# Patient Record
Sex: Male | Born: 1965 | Race: White | Hispanic: No | Marital: Married | Smoking: Never smoker
Health system: Southern US, Community
[De-identification: ages and names within clinical notes are randomized; demographics above are authoritative.]

## PROBLEM LIST (undated history)

## (undated) DIAGNOSIS — R0683 Snoring: Secondary | ICD-10-CM

## (undated) DIAGNOSIS — G4719 Other hypersomnia: Secondary | ICD-10-CM

## (undated) DIAGNOSIS — G4733 Obstructive sleep apnea (adult) (pediatric): Secondary | ICD-10-CM

## (undated) DIAGNOSIS — E785 Hyperlipidemia, unspecified: Secondary | ICD-10-CM

## (undated) DIAGNOSIS — I10 Essential (primary) hypertension: Secondary | ICD-10-CM

## (undated) DIAGNOSIS — I2510 Atherosclerotic heart disease of native coronary artery without angina pectoris: Secondary | ICD-10-CM

## (undated) DIAGNOSIS — K4091 Unilateral inguinal hernia, without obstruction or gangrene, recurrent: Secondary | ICD-10-CM

## (undated) DIAGNOSIS — M1990 Unspecified osteoarthritis, unspecified site: Secondary | ICD-10-CM

## (undated) DIAGNOSIS — R079 Chest pain, unspecified: Secondary | ICD-10-CM

## (undated) HISTORY — DX: Other hypersomnia: G47.19

## (undated) HISTORY — DX: Obstructive sleep apnea (adult) (pediatric): G47.33

## (undated) HISTORY — DX: Chest pain, unspecified: R07.9

## (undated) HISTORY — DX: Atherosclerotic heart disease of native coronary artery without angina pectoris: I25.10

## (undated) HISTORY — DX: Unspecified osteoarthritis, unspecified site: M19.90

## (undated) HISTORY — PX: VENTRAL HERNIA REPAIR: SHX424

## (undated) HISTORY — DX: Essential (primary) hypertension: I10

## (undated) HISTORY — DX: Hyperlipidemia, unspecified: E78.5

## (undated) HISTORY — DX: Snoring: R06.83

## (undated) HISTORY — DX: Morbid (severe) obesity due to excess calories: E66.01

## (undated) HISTORY — DX: Unilateral inguinal hernia, without obstruction or gangrene, recurrent: K40.91

## (undated) HISTORY — PX: CARDIAC CATHETERIZATION: SHX172

---

## 1998-03-23 ENCOUNTER — Emergency Department (HOSPITAL_COMMUNITY): Admission: EM | Admit: 1998-03-23 | Discharge: 1998-03-23 | Payer: Self-pay | Admitting: Emergency Medicine

## 1998-04-05 ENCOUNTER — Observation Stay (HOSPITAL_COMMUNITY): Admission: RE | Admit: 1998-04-05 | Discharge: 1998-04-06 | Payer: Self-pay | Admitting: Surgery

## 1999-04-07 ENCOUNTER — Observation Stay (HOSPITAL_COMMUNITY): Admission: RE | Admit: 1999-04-07 | Discharge: 1999-04-08 | Payer: Self-pay | Admitting: Surgery

## 2000-07-27 ENCOUNTER — Ambulatory Visit (HOSPITAL_BASED_OUTPATIENT_CLINIC_OR_DEPARTMENT_OTHER): Admission: RE | Admit: 2000-07-27 | Discharge: 2000-07-28 | Payer: Self-pay | Admitting: Surgery

## 2006-09-02 ENCOUNTER — Emergency Department (HOSPITAL_COMMUNITY): Admission: EM | Admit: 2006-09-02 | Discharge: 2006-09-02 | Payer: Self-pay | Admitting: Emergency Medicine

## 2007-02-04 ENCOUNTER — Ambulatory Visit (HOSPITAL_COMMUNITY): Admission: RE | Admit: 2007-02-04 | Discharge: 2007-02-04 | Payer: Self-pay | Admitting: Family Medicine

## 2007-02-04 IMAGING — US US ABDOMEN COMPLETE
1 series · 13 of 25 positions shown · non-contrast
Comparison: None.

CLINICAL DATA: Left upper quadrant pain.  
 ABDOMEN ULTRASOUND:
TECHNIQUE: Complete abdominal ultrasound examination was performed including evaluation of the liver, gallbladder, bile ducts, pancreas, kidneys, spleen, IVC, and abdominal aorta.

[Series 1: unknown · 0.37mm/px · 13 of 52 slices shown]
[im 1/52]
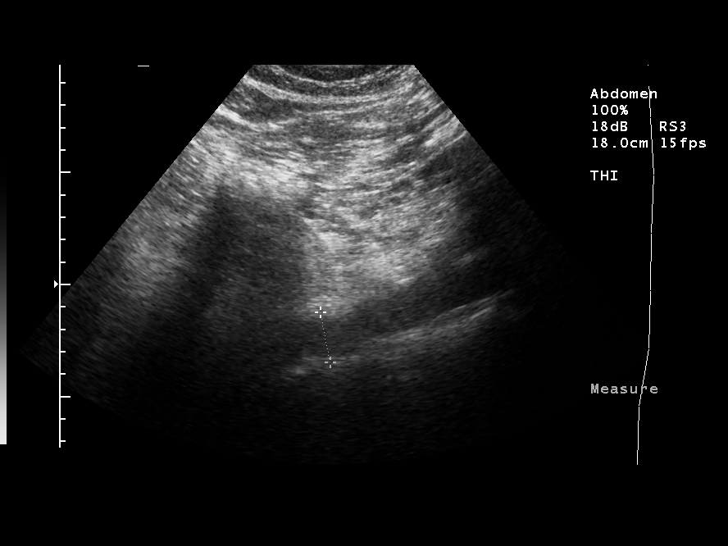
[im 5/52]
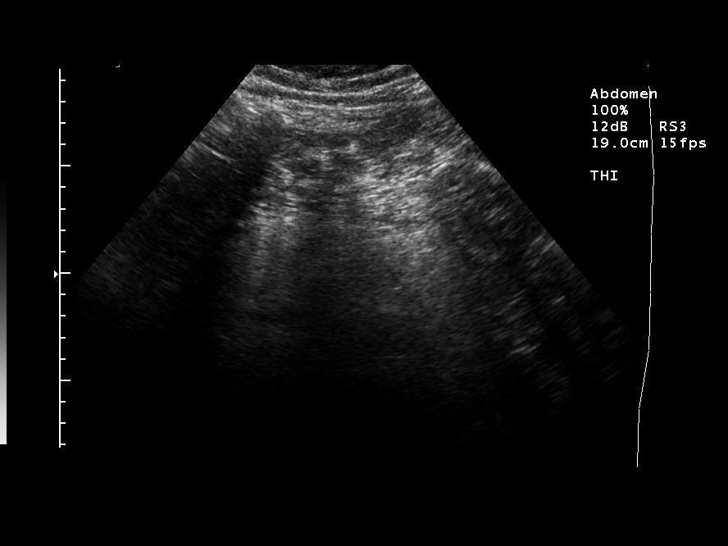
[im 9/52]
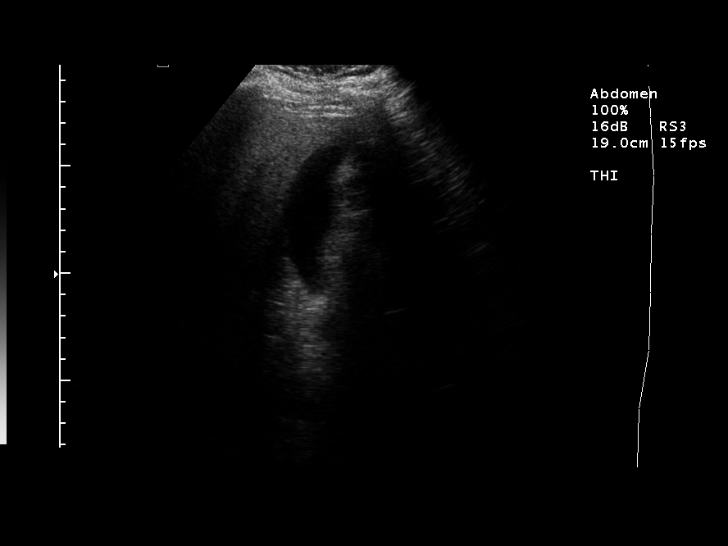
[im 13/52]
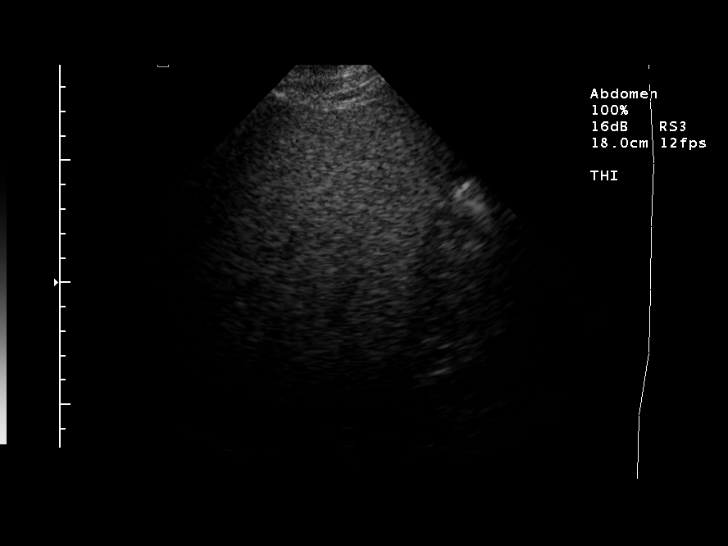
[im 18/52]
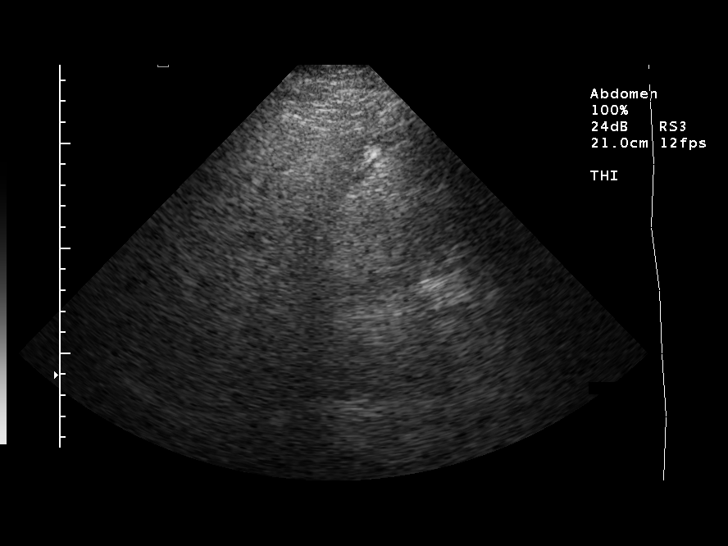
[im 22/52]
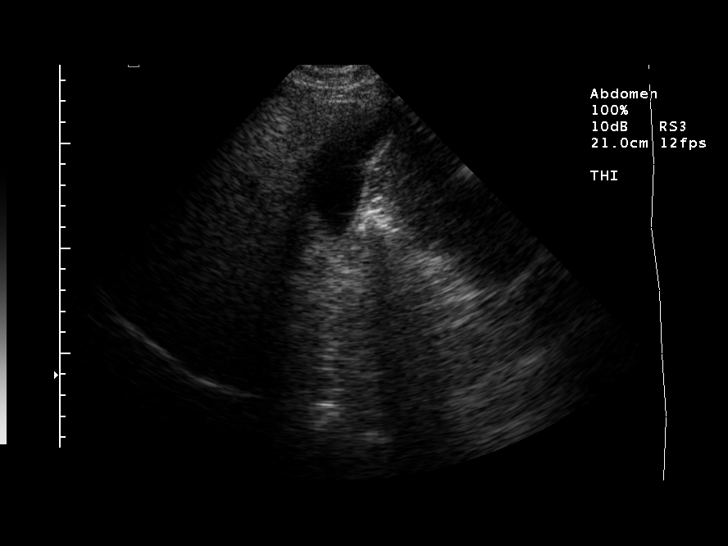
[im 26/52]
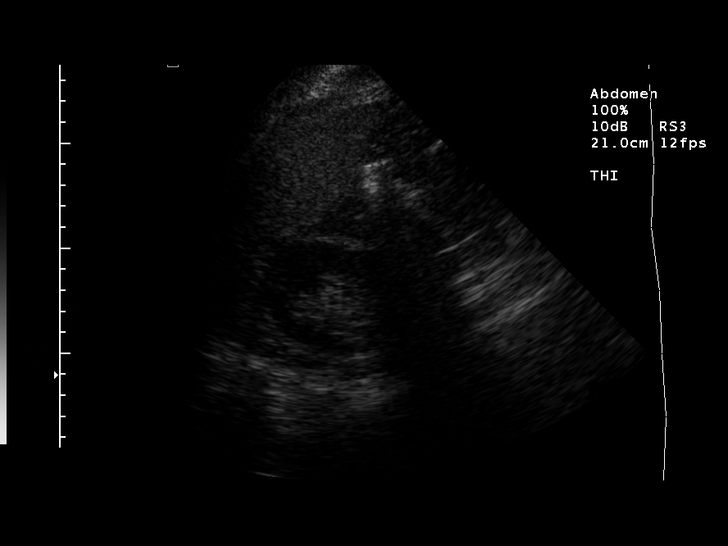
[im 30/52]
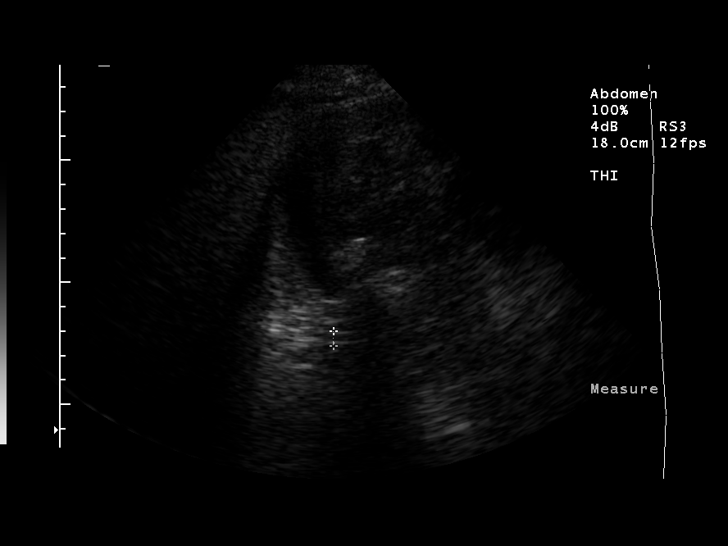
[im 35/52]
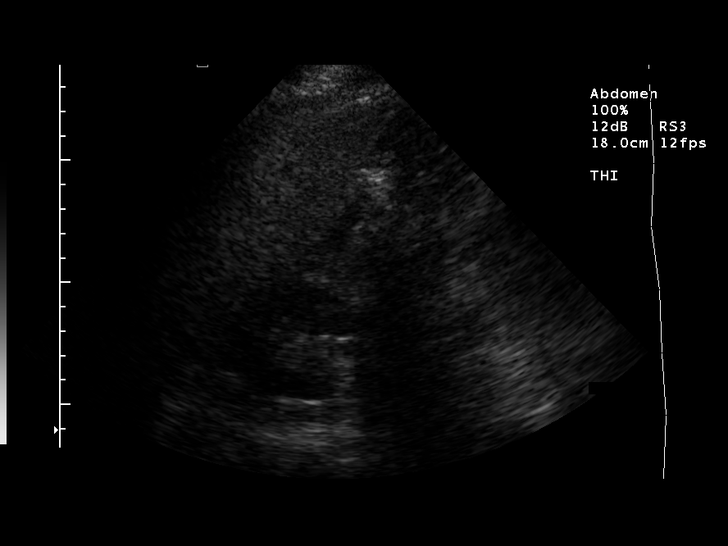
[im 39/52]
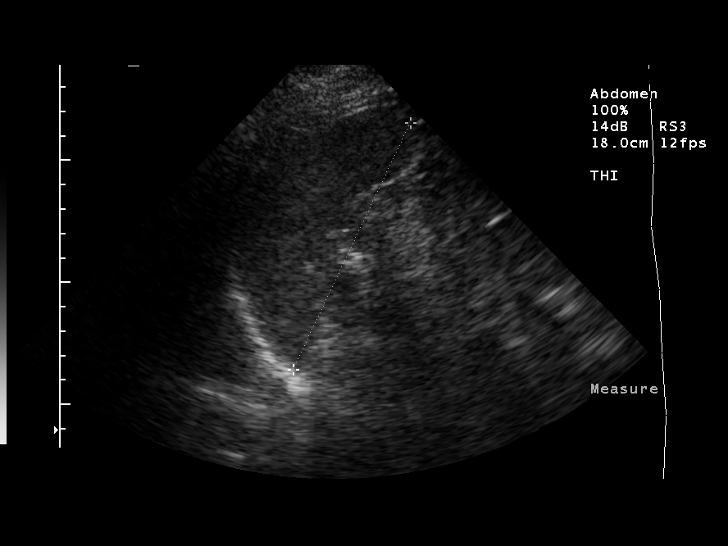
[im 43/52]
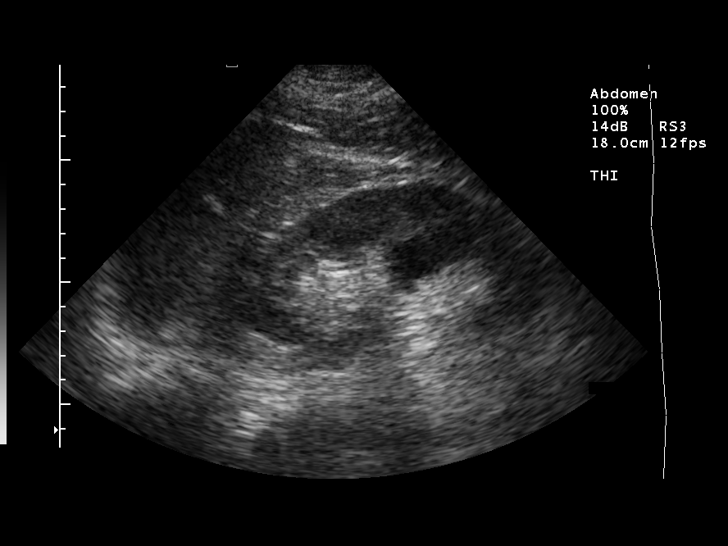
[im 47/52]
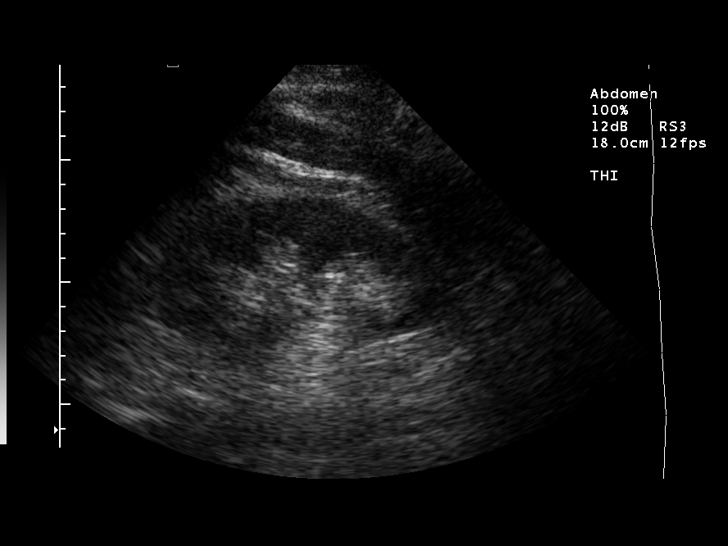
[im 52/52]
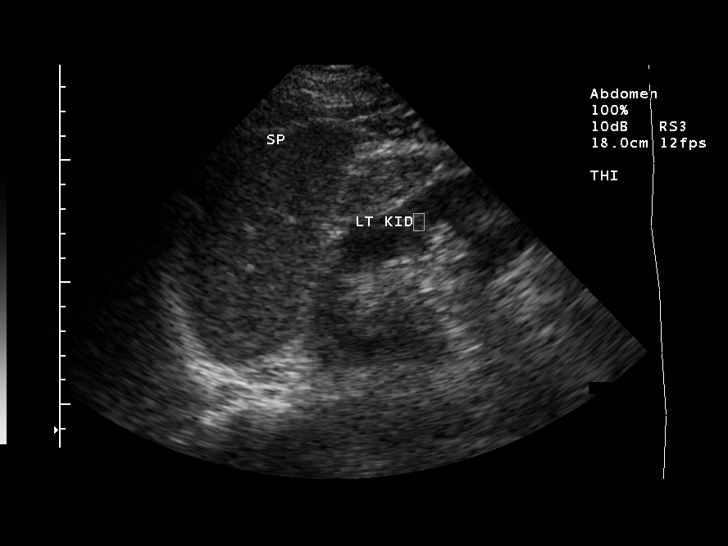

[13 of 25 positions shown; findings below may reference images not displayed]

FINDINGS: Gallbladder size and contour normal.  No stones or wall thickening.  Common bile duct upper limits of normal, measured at 6 mm.  No intrahepatic duct dilatation.  There is increased density of the liver however.  This is most commonly seen with fatty infiltration, but also can be seen in diffuse hepatocellular disease.  
 Excessive bowel gas in the epigastrium obscures most of the pancreas and the IVC.  There is no enlargement of the spleen, nor are there filling defects noted. There is no obvious mass in the left upper quadrant.  
 Kidney size normal.  There is a simple cyst noted centrally in the left kidney measuring about 2.4 cm.  Aorta nondilated.
IMPRESSION: 1.  No definite acute or significant findings ? specifically, no obvious etiology for left upper quadrant pain.  Consider CT if symptoms persist.
 2.  Pancreas inadequately evaluated.  
 3.  Echodense liver. 
 4.  Simple left renal cyst.

## 2008-10-30 HISTORY — PX: INGUINAL HERNIA REPAIR: SUR1180

## 2009-01-20 ENCOUNTER — Encounter: Admission: RE | Admit: 2009-01-20 | Discharge: 2009-01-20 | Payer: Self-pay | Admitting: Family Medicine

## 2009-01-20 IMAGING — CT CT PARANASAL SINUSES LIMITED
1 series · 13 of 15 positions shown, 17 images · non-contrast
Comparison: None.

CLINICAL DATA: Sinusitis.

CT PARANASAL SINUS LIMITED WITHOUT CONTRAST
TECHNIQUE: Multidetector CT images of the paranasal sinuses were
obtained in a single plane without contrast.

[Series 2: sinus · axial · 0.49mm/px · z∈[-316,-228]mm · 13 of 15 slices shown, 17 images]
[im 2/15  brain]
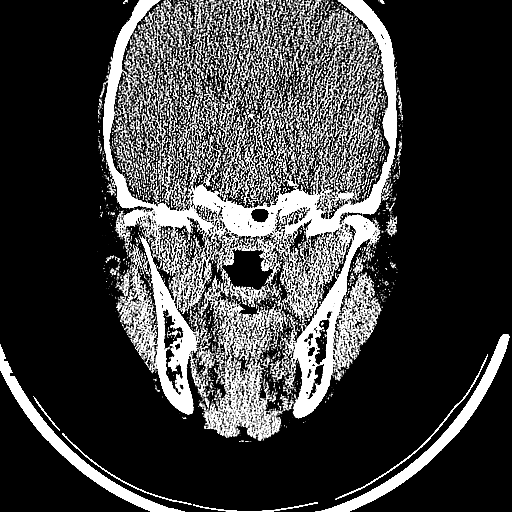
[im 2/15  bone]
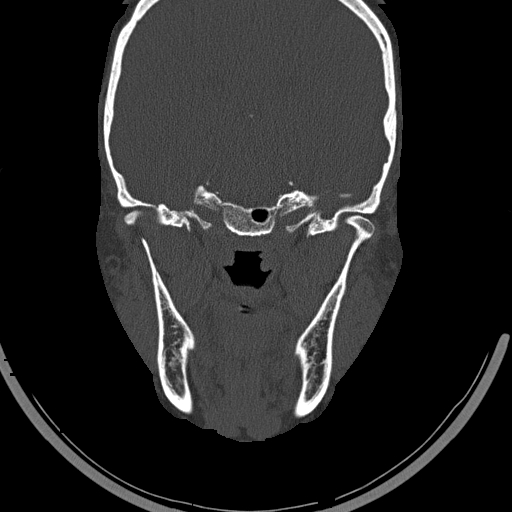
[im 3/15  bone]
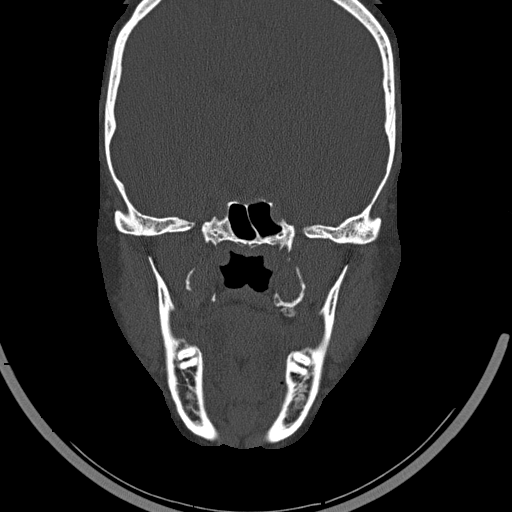
[im 4/15  bone]
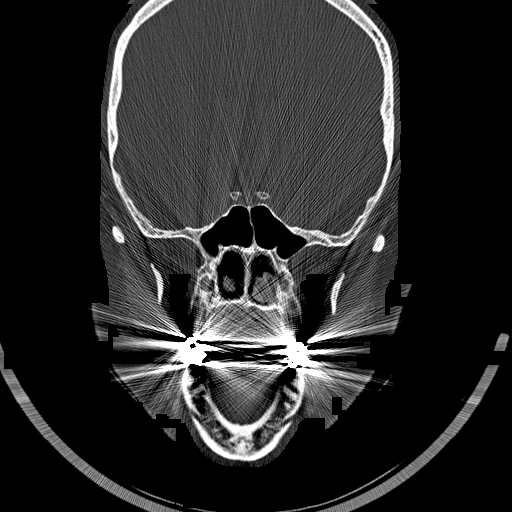
[im 5/15  bone]
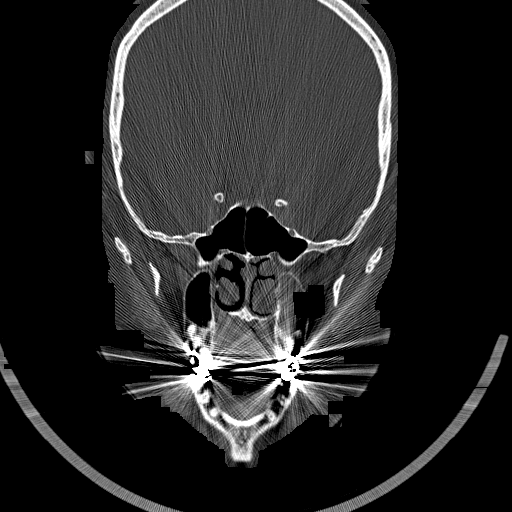
[im 6/15  brain]
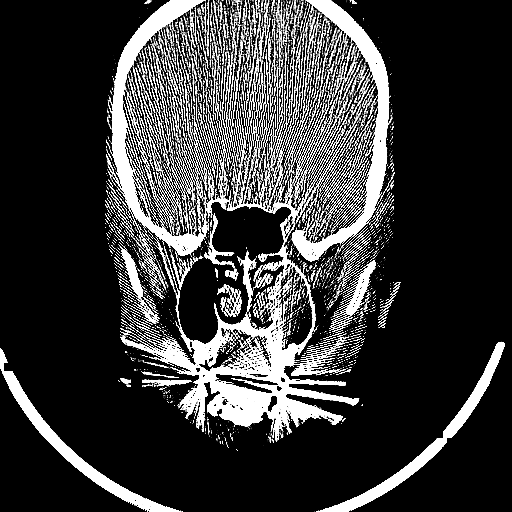
[im 6/15  bone]
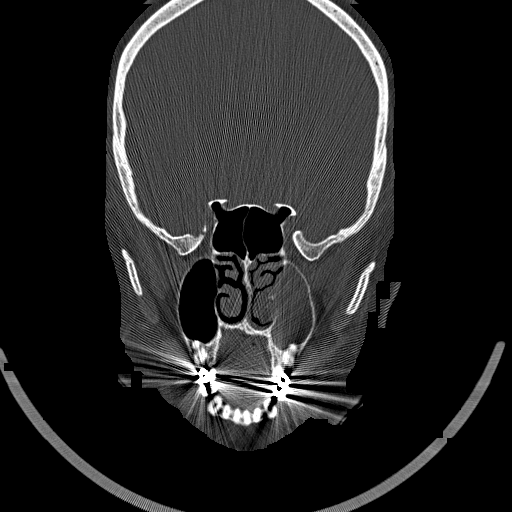
[im 7/15  bone]
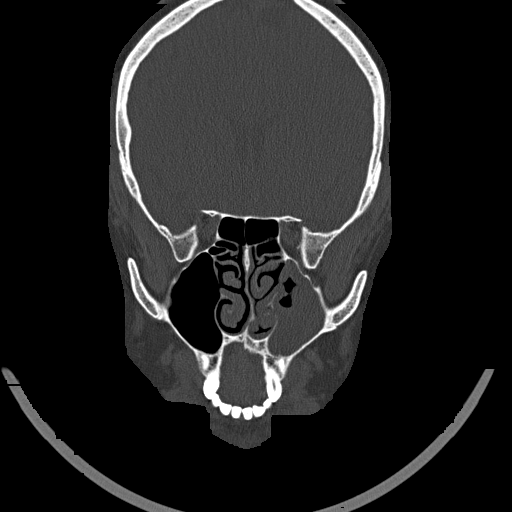
[im 8/15  bone]
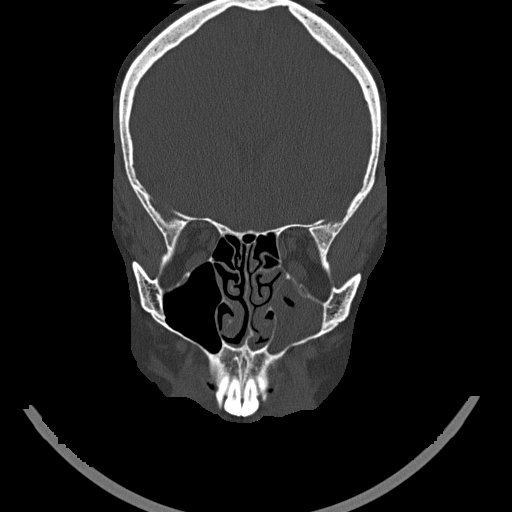
[im 9/15  bone]
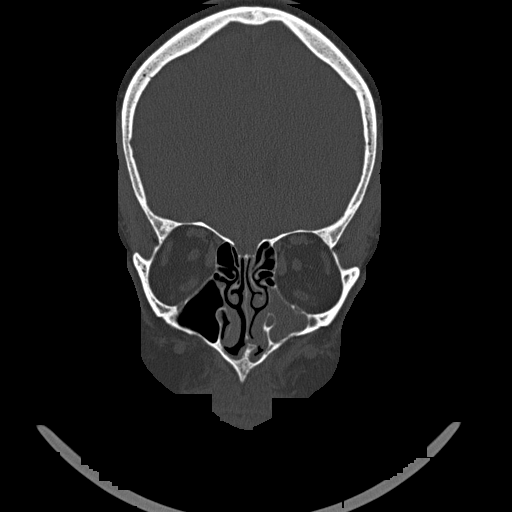
[im 10/15  brain]
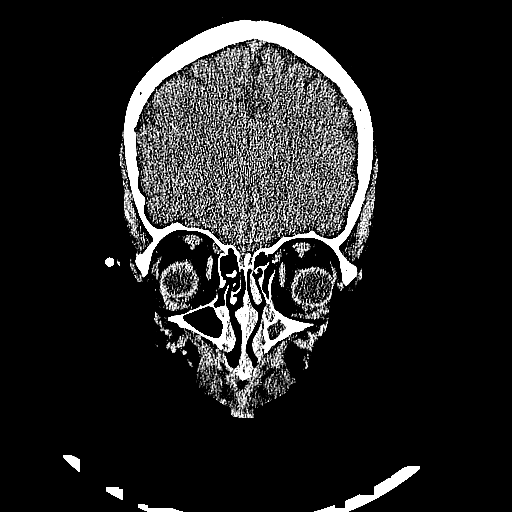
[im 10/15  bone]
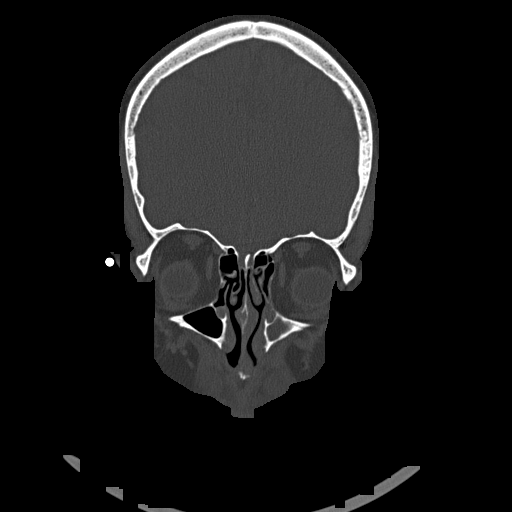
[im 11/15  bone]
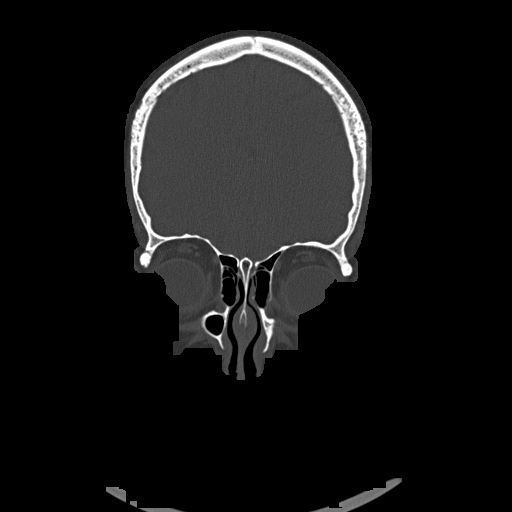
[im 12/15  bone]
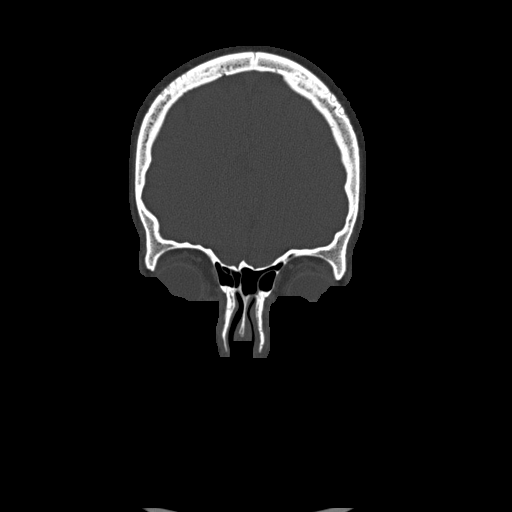
[im 13/15  bone]
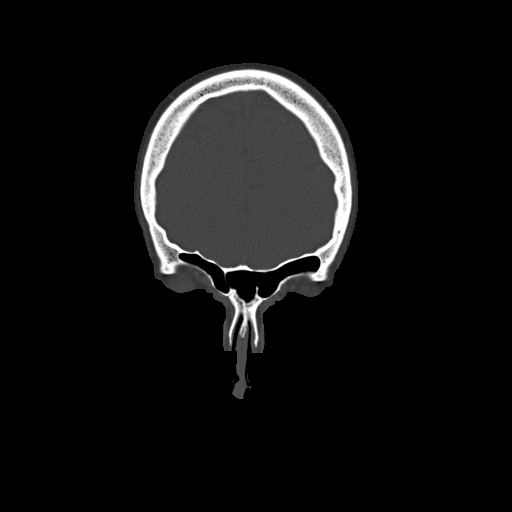
[im 14/15  brain]
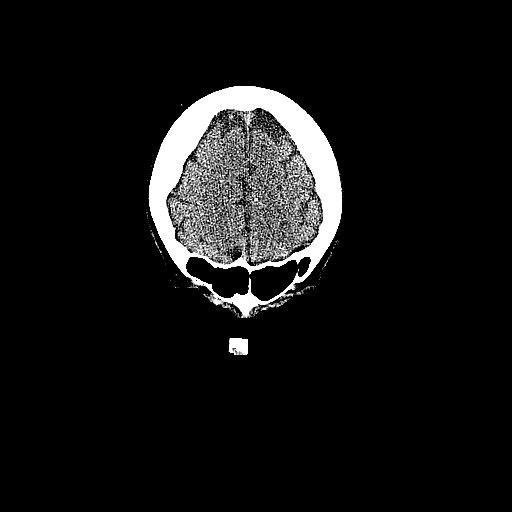
[im 14/15  bone]
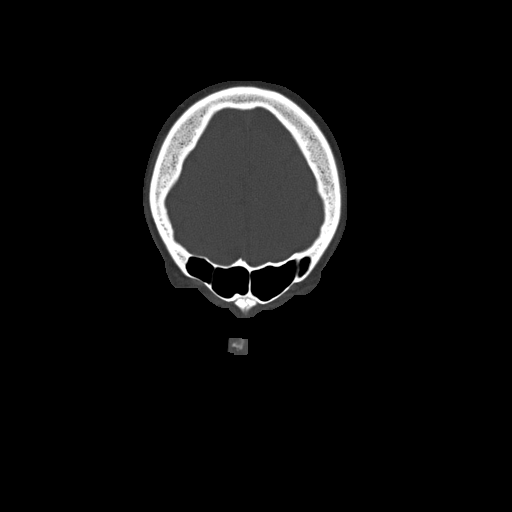

[13 of 15 positions shown; findings below may reference images not displayed]

FINDINGS: The left maxillary sinus is nearly completely opacified.
The right maxillary sinus is clear.  The frontal ethmoid and
sphenoid sinuses are clear.  There is no air-fluid level.  The
nasal septum is deviated to the left.  There is no bony thickening
or bone destruction.
IMPRESSION: Opacification of the left maxillary sinus due to chronic sinusitis.
The remaining sinuses are clear.

## 2009-03-19 ENCOUNTER — Ambulatory Visit (HOSPITAL_COMMUNITY): Admission: RE | Admit: 2009-03-19 | Discharge: 2009-03-19 | Payer: Self-pay | Admitting: Specialist

## 2009-03-19 IMAGING — CR DG ORBITS FOR FOREIGN BODY
2 series · 2 of 2 positions shown · non-contrast
Comparison: None

CLINICAL DATA: Pre MRI screening

ORBITS FOR FOREIGN BODY - 2 VIEW

[view not recorded (1 of 2)]
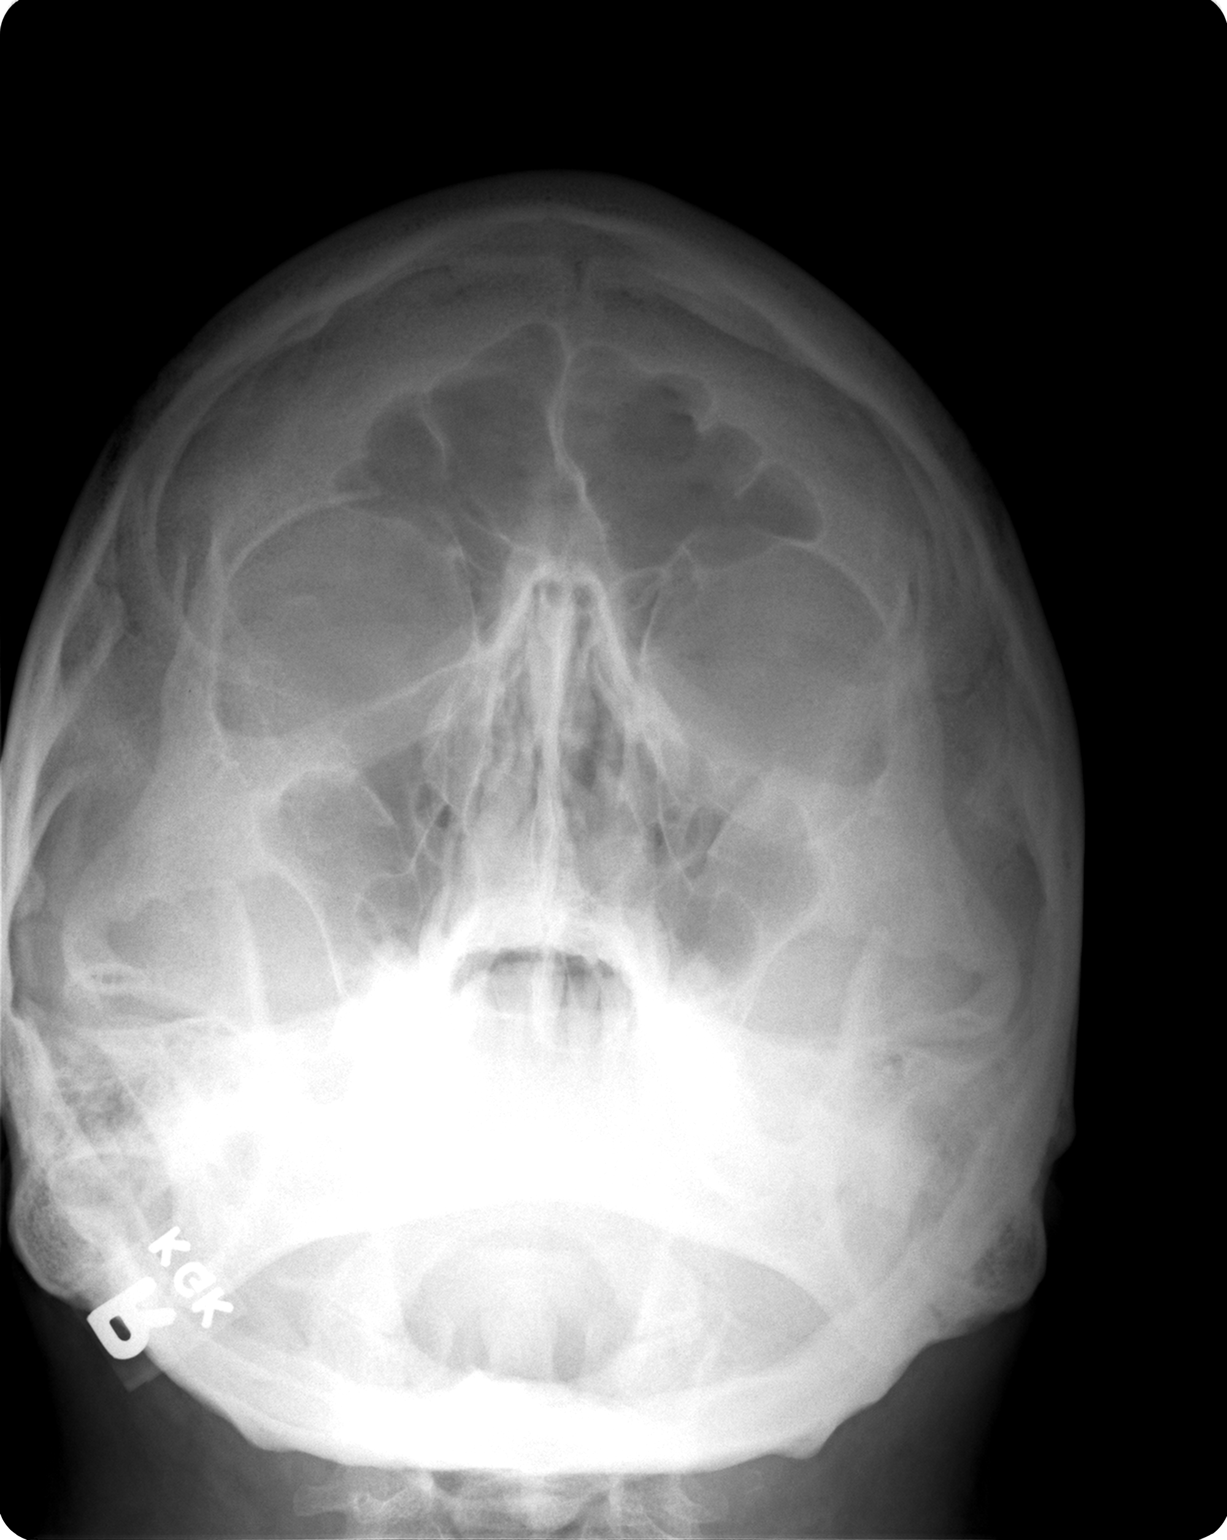

[view not recorded (2 of 2)]
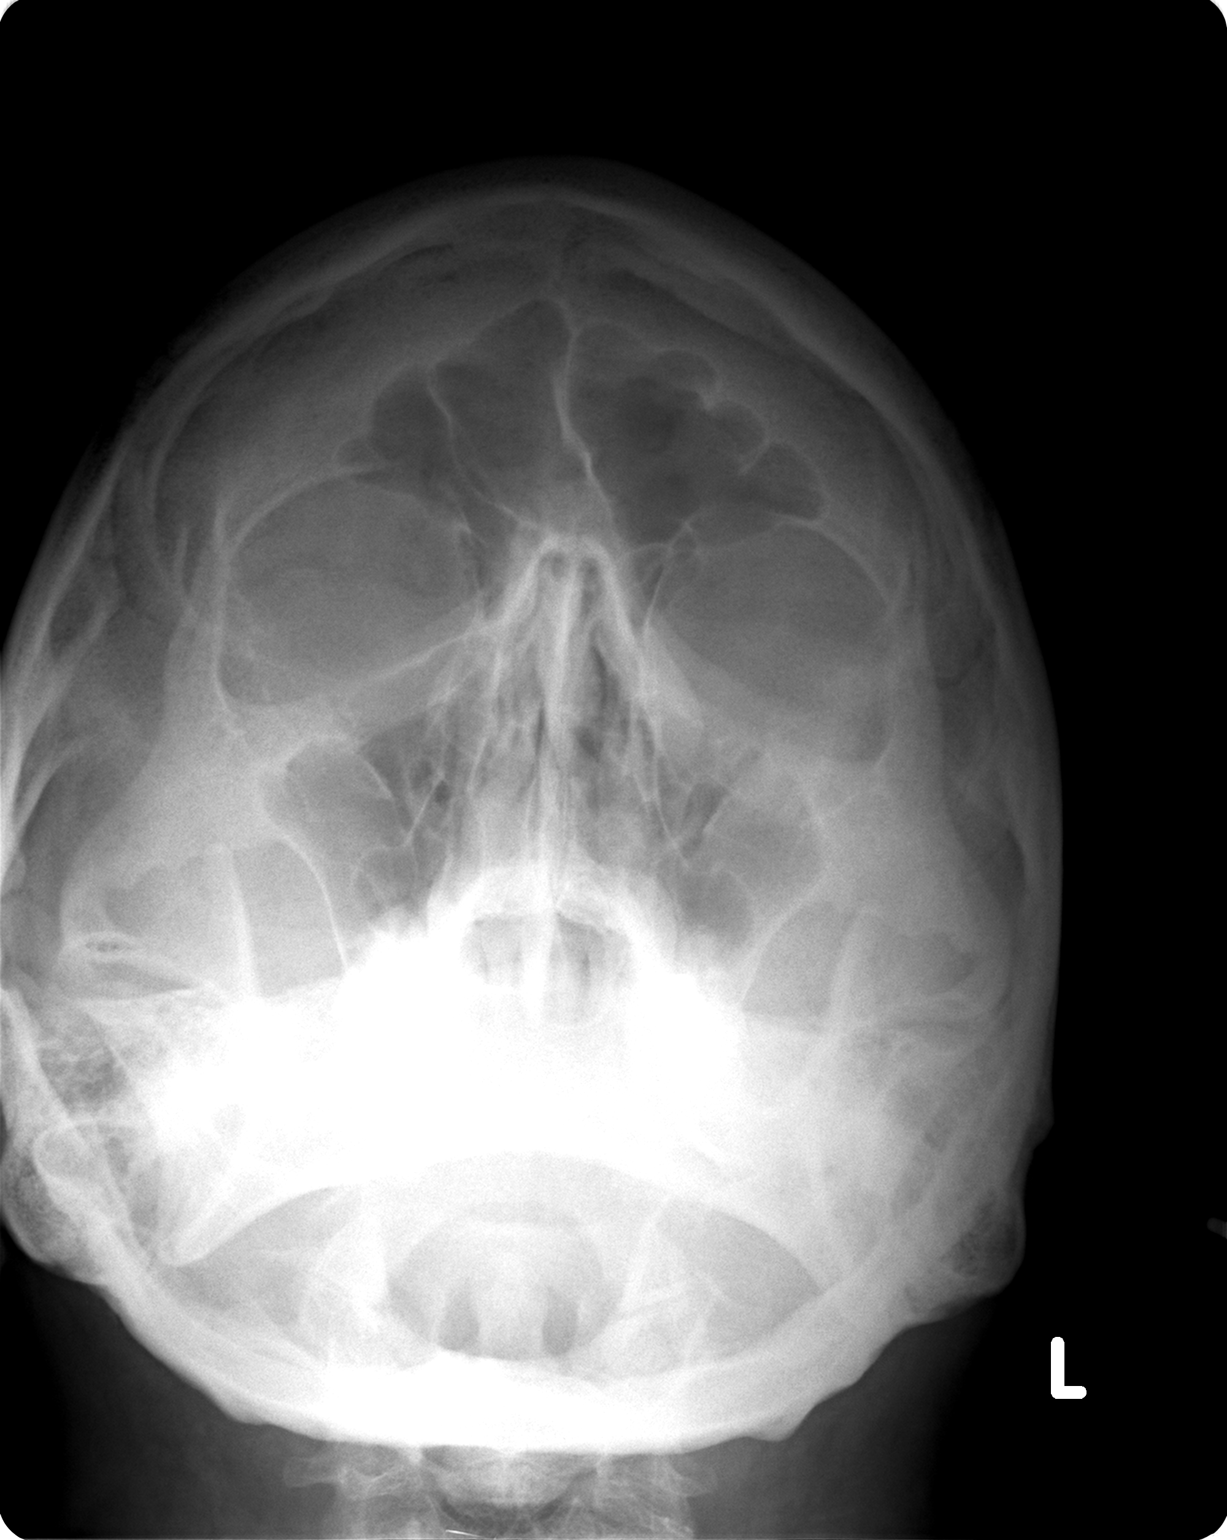

[2 of 2 positions shown; findings below may reference images not displayed]

FINDINGS: No radiopaque foreign bodies are identified within the
orbits to preclude MRI.
IMPRESSION: 1.  Normal exam.

## 2009-08-09 ENCOUNTER — Ambulatory Visit (HOSPITAL_BASED_OUTPATIENT_CLINIC_OR_DEPARTMENT_OTHER): Admission: RE | Admit: 2009-08-09 | Discharge: 2009-08-09 | Payer: Self-pay | Admitting: General Surgery

## 2010-11-20 ENCOUNTER — Encounter: Payer: Self-pay | Admitting: Family Medicine

## 2011-02-02 LAB — POCT HEMOGLOBIN-HEMACUE: Hemoglobin: 16.8 g/dL (ref 13.0–17.0)

## 2011-03-17 NOTE — Op Note (Signed)
[place]  Patient:    NEPHI, SAVAGE                  MRN: [id number] Proc. Date: 07/27/00 Adm. Date:  [id number] Attending:  Katha Cabal                           Operative Report  PREOPERATIVE DIAGNOSIS:  Recurrent ventral hernia.  POSTOPERATIVE DIAGNOSIS:  Left lateral recurrent to previous repair of ventral hernia.  SURGEON:  Thornton Park. Daphine Deutscher, M.D.  ASSISTANT:  Adolph Pollack, M.D.  ANESTHESIA:  General endotracheal anesthesia.  FINDINGS:  Small epigastric recurrent hernia containing preperitoneal fat.  DESCRIPTION OF PROCEDURE:  Mr. Margraf is taken to OR #6 at [facility] on July 27, 2000 and given general anesthesia.  Preoperatively, he received a gram of Ancef.  The abdomen was prepped widely with Betadine and draped sterilely.  I made an incision initially using just part of his previous transverse incision and eventually taking it all.  Carrying this down in the fatty tissue and found the preperitoneal hernia lateral to the previous repair with mesh.  What I found when I had freed this up and gotten around the hernia was that the fascia had torn out to the edge of the rectus sheath.  This would imply that he had diastasis and weakening in this area, because I had previously repaired this both from within and without with Marlex mesh as well as incorporating his previously placed Gore-Tex.  I then repaired this using a vest-over-pants repair using 0 Prolene to imbricate the fascia and repair this hole.  Then on top, I placed a piece of Marlex mesh which I cut to fit and threaded the previously placed sutures up through that as well as I had under direct vision and with direct feeling placed four horizontal mattress sutures in the fascia which I had anchored and tied and then threaded those up to anchor the mesh laterally, superiorly, medially, and inferiorly.  When this was tied down, it seemed to  be secure with permanent suture.  I then went back with 2-0 Vicryl and tacked these to the fascia even more to make it completely adherent anteriorly.  The wound was then irrigated with a copious amount of saline.  While I did have the hole opened, I was able to feel around and I did not have any involvement with bowel.  I could feel the Gore-Tex mesh around.  There was a little area superiorly that I could feel a tack in the edge of the mesh and it felt like it might be starting to tear, although it was a very tiny probably millimeter size defect.  At this point, I felt that this was not worth making a separate incision and potentially weakening more of that wound.  After completing the closure in the aforementioned repair, I used 4-0 Vicryl to close the subcutaneous tissue and staples on the skin.  The patient will be kept at [facility] in the recovery care center possibly to go home later today or to be kept overnight.  Mepergan Fortes prescription is given for pain. DD:  07/27/00 TD:  07/27/00 Job: [id number]

## 2012-03-04 ENCOUNTER — Encounter (HOSPITAL_COMMUNITY): Payer: Self-pay

## 2012-03-04 ENCOUNTER — Emergency Department (HOSPITAL_COMMUNITY): Payer: 59

## 2012-03-04 ENCOUNTER — Observation Stay (HOSPITAL_COMMUNITY)
Admission: EM | Admit: 2012-03-04 | Discharge: 2012-03-05 | Disposition: A | Discharge status: Home / Self Care | Payer: 59 | Attending: Emergency Medicine | Admitting: Emergency Medicine

## 2012-03-04 DIAGNOSIS — R0609 Other forms of dyspnea: Secondary | ICD-10-CM | POA: Insufficient documentation

## 2012-03-04 DIAGNOSIS — R079 Chest pain, unspecified: Principal | ICD-10-CM | POA: Insufficient documentation

## 2012-03-04 DIAGNOSIS — I10 Essential (primary) hypertension: Secondary | ICD-10-CM | POA: Insufficient documentation

## 2012-03-04 DIAGNOSIS — R0989 Other specified symptoms and signs involving the circulatory and respiratory systems: Secondary | ICD-10-CM | POA: Insufficient documentation

## 2012-03-04 DIAGNOSIS — R61 Generalized hyperhidrosis: Secondary | ICD-10-CM | POA: Insufficient documentation

## 2012-03-04 HISTORY — DX: Essential (primary) hypertension: I10

## 2012-03-04 LAB — BASIC METABOLIC PANEL
BUN: 12 mg/dL (ref 6–23)
CO2: 24 mEq/L (ref 19–32)
Calcium: 9.4 mg/dL (ref 8.4–10.5)
Chloride: 103 mEq/L (ref 96–112)
Creatinine, Ser: 0.77 mg/dL (ref 0.50–1.35)
GFR calc Af Amer: >90 mL/min (ref >90)

## 2012-03-04 LAB — DIFFERENTIAL
Basophils Absolute: 0 10*3/uL (ref 0.0–0.1)
Basophils Relative: 0 % (ref 0–1)
Eosinophils Relative: 1 % (ref 0–5)
Monocytes Absolute: 0.5 10*3/uL (ref 0.1–1.0)
Monocytes Relative: 6 % (ref 3–12)
Neutro Abs: 6 10*3/uL (ref 1.7–7.7)

## 2012-03-04 LAB — CARDIAC PANEL(CRET KIN+CKTOT+MB+TROPI)
Relative Index: 2.4 (ref 0.0–2.5)
Relative Index: 2 (ref 0.0–2.5)
Relative Index: 2.5 (ref 0.0–2.5)
Total CK: 205 U/L (ref 7–232)
Total CK: 232 U/L (ref 7–232)
Total CK: 179 U/L (ref 7–232)
Troponin I: <0.3 ng/mL (ref <0.30)

## 2012-03-04 LAB — CBC
HCT: 43.1 % (ref 39.0–52.0)
Hemoglobin: 14.7 g/dL (ref 13.0–17.0)
MCHC: 34.1 g/dL (ref 30.0–36.0)
MCV: 85.9 fL (ref 78.0–100.0)
RDW: 12.7 % (ref 11.5–15.5)

## 2012-03-04 IMAGING — CR DG CHEST 2V
2 series · 2 of 2 positions shown · non-contrast
Comparison: None.

CLINICAL DATA: Chest pain

CHEST - 2 VIEW

[w chest pa]
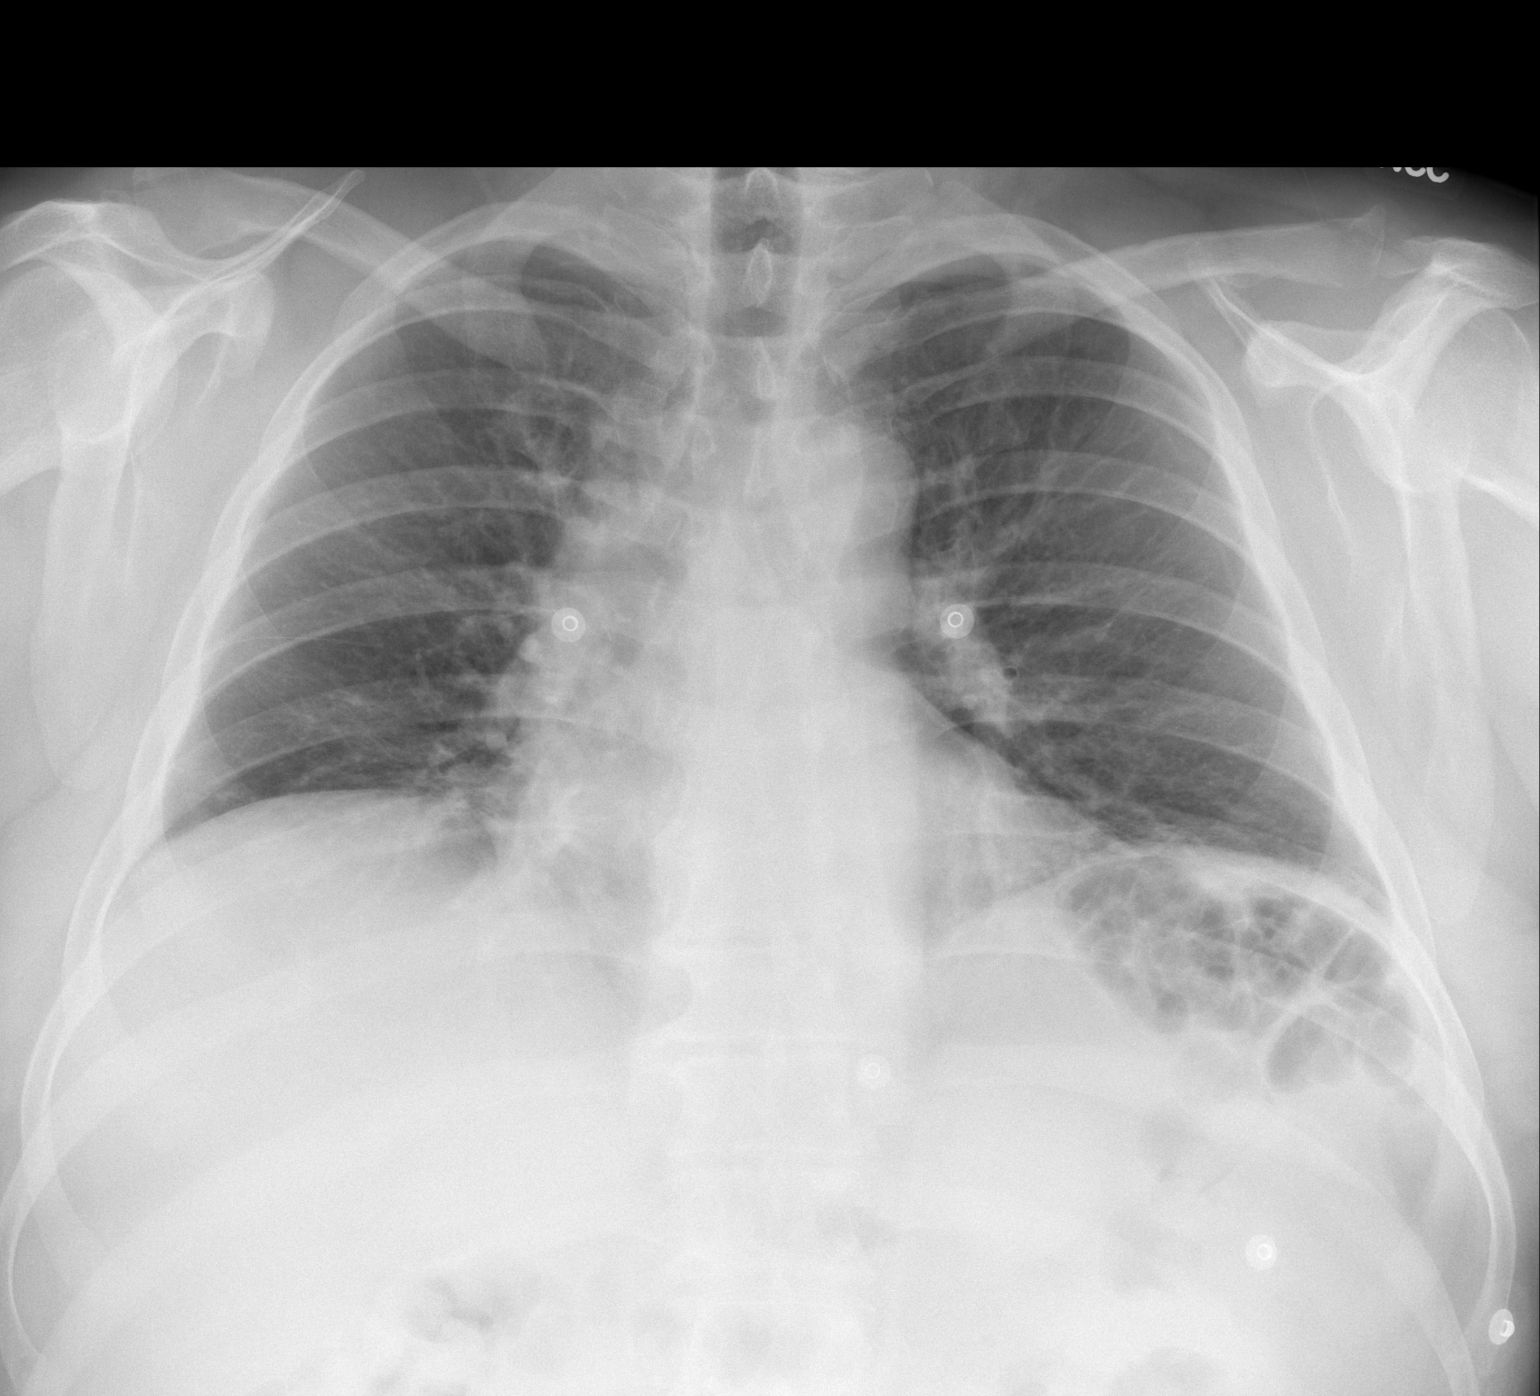

[w chest lat]
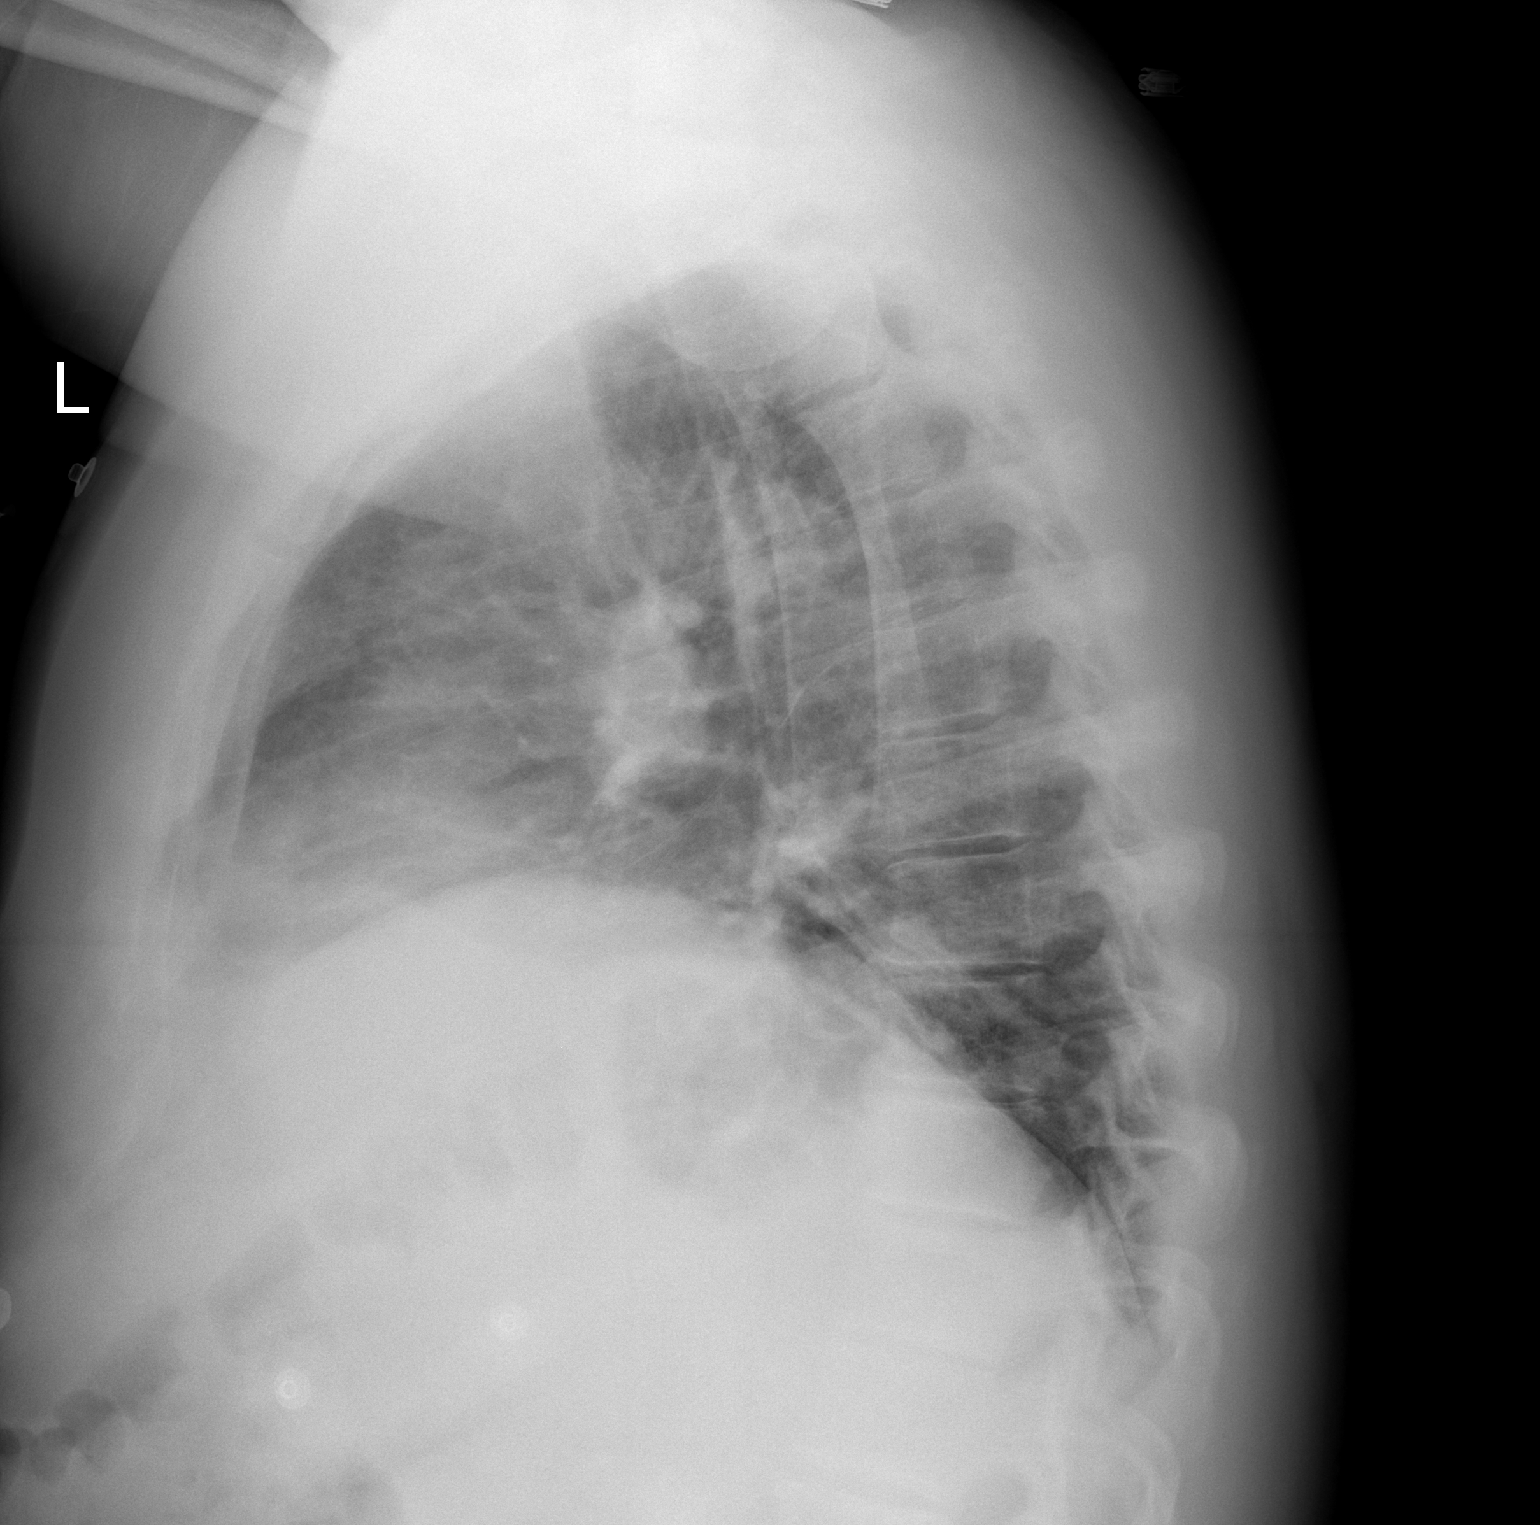

[2 of 2 positions shown; findings below may reference images not displayed]

FINDINGS: Cardiomediastinal silhouette is unremarkable.  No acute
infiltrate or pulmonary edema.  Mild left basilar atelectasis.  No
pneumothorax.
IMPRESSION: No acute infiltrate or pulmonary edema.  Mild left basilar
atelectasis.

## 2012-03-04 MED ORDER — MORPHINE SULFATE 4 MG/ML IJ SOLN
4.0000 mg | Freq: Once | INTRAMUSCULAR | Status: AC
  Administered 2012-03-04: 4 mg via INTRAVENOUS
  Filled 2012-03-04: qty 1

## 2012-03-04 NOTE — ED Notes (Signed)
Pt resting quietly with no s/s of any pain or distress noted. Pt denies any pain or complaints at this time, visitor at bedside and will continue to monitor pt.

## 2012-03-04 NOTE — ED Notes (Signed)
Patient arrived in room CDU3. Placed in unit bed and placed on unit monitoring system.

## 2012-03-04 NOTE — ED Provider Notes (Signed)
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 He stopped and the pain subsided and went back to standing the pain got worse. There is associated dyspnea and diaphoresis but no nausea or vomiting. He states it feels as if the wind were knocked out of him. He was brought in by ambulance where he was given aspirin and nitroglycerin with improvement in pain. His initial cardiac markers are negative. I am shows no chest wall tenderness. He has cardiac risk factor of hypertension but no other cardiac risk factors. He will be admitted to CDU for serial cardiac markers and cardiac CT angiography.  ECG shows normal sinus rhythm with a rate of 86, no ectopy. Normal axis. Normal P wave. Normal QRS. Normal intervals. Normal ST and T waves. Impression: normal ECG. No old ECG available for comparison.   Dione Booze, MD 03/04/12 [phone]

## 2012-03-04 NOTE — ED Notes (Signed)
Per ems- pt c/o sudden onset of right sided chest pain. Pain is nonradiating and associated with SOB. Denies nausea/diaphoresis. Pt received 324 asa and 2 nitro which did reduce pain to 4/10.

## 2012-03-04 NOTE — ED Provider Notes (Signed)
History     CSN: [id number]  Arrival date & time 03/04/12  1327   First MD Initiated Contact with Patient 03/04/12 1416      Chief Complaint  Patient presents with  . Chest Pain    (Consider location/radiation/quality/duration/timing/severity/associated sxs/prior treatment) HPI Comments: Patient comes in via EMS today with a chief complaint of chest pain.  EMS gave patient a 324 mg aspirin and two nitroglycerin prior to arrival in the ED, which helped the pain some what.  He reports that the pain is located on the right side of his chest and does not radiate.  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  He describes the pain as a sharp pressure.  He reports that the pain is worse with deep breaths and with movement.  Chest pain was associated with some diaphoresis initially, but no nausea, vomiting, lightheadedness, syncope, or numbness.  He reports that he has never had chest pain before.  No prior cardiac history.  No family history of cardiac history.  He does have a history of HTN.  No history of DM or hyperlipidemia.  The history is provided by the patient.    Past Medical History  Diagnosis Date  . Hypertension     No past surgical history on file.  No family history on file.  History  Substance Use Topics  . Smoking status: Not on file  . Smokeless tobacco: Not on file  . Alcohol Use:       Review of Systems  Constitutional: Positive for diaphoresis. Negative for fever and chills.  HENT: Negative for neck pain and neck stiffness.   Respiratory: Positive for shortness of breath. Negative for cough.   Cardiovascular: Positive for chest pain.  Gastrointestinal: Negative for nausea, vomiting and abdominal pain.  Neurological: Negative for dizziness, syncope, light-headedness and numbness.    Allergies  Review of patient's allergies indicates no known allergies.  Home Medications   Current Outpatient Rx    Name Route Sig Dispense Refill  . PERINDOPRIL ERBUMINE 2 MG PO TABS Oral Take 2 mg by mouth daily.      BP 120/78  Pulse 85  Temp(Src) 97.3 F (36.3 C) (Oral)  Resp 21  SpO2 95%  Physical Exam  Nursing note and vitals reviewed. Constitutional: He is oriented to person, place, and time. He appears well-developed and well-nourished. No distress.  HENT:  Head: Normocephalic and atraumatic.  Mouth/Throat: Oropharynx is clear and moist.  Neck: Normal range of motion. Neck supple.  Cardiovascular: Normal rate, regular rhythm, normal heart sounds and intact distal pulses.        No peripheral edema  Pulmonary/Chest: Effort normal and breath sounds normal. No respiratory distress. He has no wheezes. He has no rales. He exhibits no tenderness.  Abdominal: Soft. There is no tenderness.  Musculoskeletal: Normal range of motion.  Neurological: He is alert and oriented to person, place, and time.  Skin: Skin is warm and dry. He is not diaphoretic.  Psychiatric: He has a normal mood and affect.    ED Course  Procedures (including critical care time)  Labs Reviewed - No data to display No results found.   No diagnosis found.  Patient discussed with Dr. Preston Fleeting who also evaluated patient and recommended putting the patient on Chest Pain protocol.  4:22 PM Patient signed out to Marlon Pel, PA-C who assumes care of patient.  Patient on CP protocol and will get a  Coronary CT in the morning.  MDM  Patient comes in today with chief complaint of chest pain that began approximately 2 hours prior to arrival.  Patient given NG x 2 and 324 mg aspirin prior to arrival.  CXR negative.  EKG does not show any acute abnormalities.  Initial cardiac enzymes negative.  Patient awaiting 3 hour and 6 hour cardiac markers.  Patient scheduled to have Coronary CT in the morning.        Pascal Lux [place], PA-C 03/04/12 2046

## 2012-03-05 ENCOUNTER — Encounter (HOSPITAL_COMMUNITY): Payer: Self-pay | Admitting: *Deleted

## 2012-03-05 ENCOUNTER — Other Ambulatory Visit: Payer: Self-pay | Admitting: Diagnostic Radiology

## 2012-03-05 ENCOUNTER — Observation Stay (HOSPITAL_COMMUNITY): Admit: 2012-03-05 | Discharge: 2012-03-05 | Disposition: A | Discharge status: Home / Self Care | Payer: 59

## 2012-03-05 IMAGING — CT CT HEART MORP W/ CTA COR W/ SCORE W/ CA W/CM &/OR W/O CM
1 of 5 series · 11 of 20 positions shown, 14 images · IV contrast (omnipaque)
Comparison: Chest radiograph [DATE]

INDICATION: 46-year-old male with left anterior chest pain.  Short
of breath.

CT ANGIOGRAPHY OF THE HEART, CORONARY ARTERY, STRUCTURE, AND
MORPHOLOGY
CONTRAST: 80mL OMNIPAQUE IOHEXOL 350 MG/ML SOLN
TECHNIQUE: CT angiography of the coronary vessels was performed on
a 256 channel system using prospective ECG gating.  A scout and
noncontrast exam (for calcium scoring) were performed.  Circulation
time was measured using a test bolus.  Coronary CTA was performed
with sub mm slice collimation during portions of the cardiac cycle
after prior injection of iodinated contrast.  Imaging post
processing was performed on an independent workstation creating
multiplanar and 3-D images, and quantitative analysis of the heart
and coronary arteries.  Note that this exam targets the heart and
the chest was not imaged in its entirety.
PREMEDICATION:
Lopressor 100 mg, P.O.
Lopressor 5 mg, IV
Nitroglycerin 400 mcg, sublingual.

[Series 8: w/ edge cor., 78.0% · axial · 0.49mm/px · z∈[-202,-99]mm · 11 of 276 slices shown, 14 images]
[im 23/276  vessel]
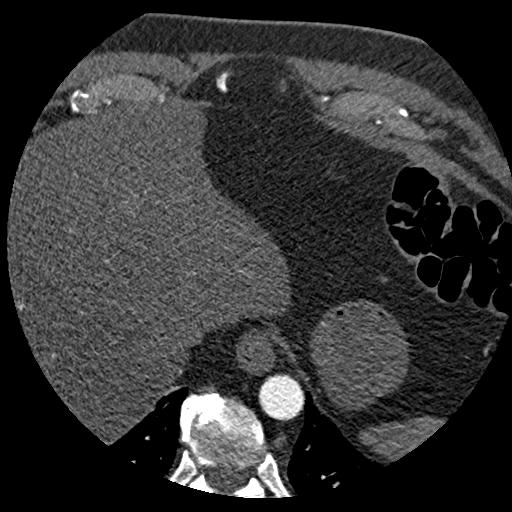
[im 23/276  lung]
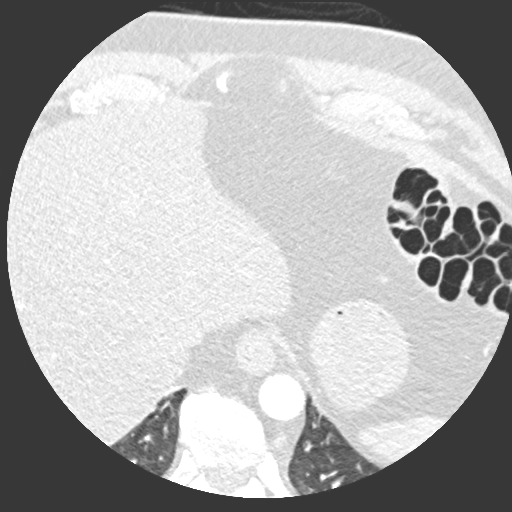
[im 46/276  vessel]
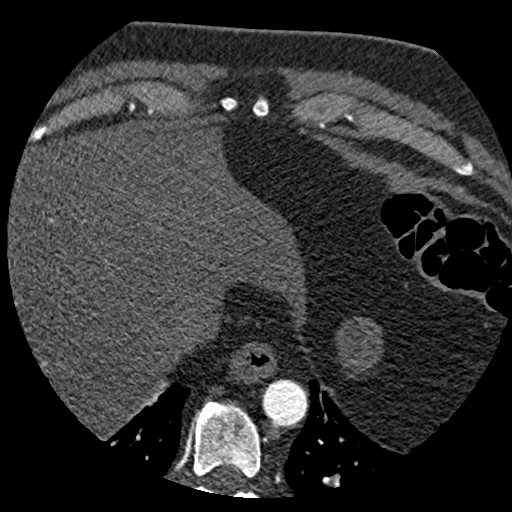
[im 69/276  vessel]
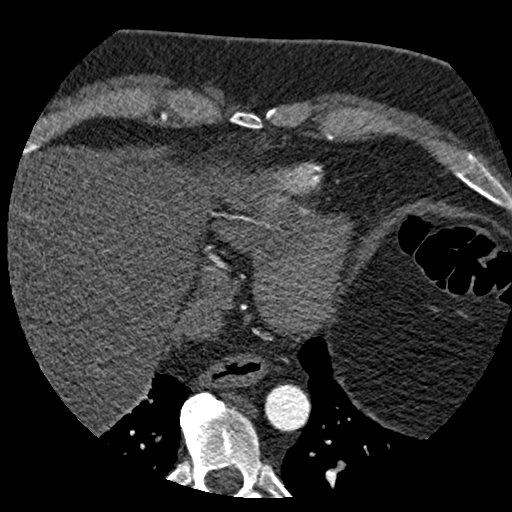
[im 92/276  vessel]
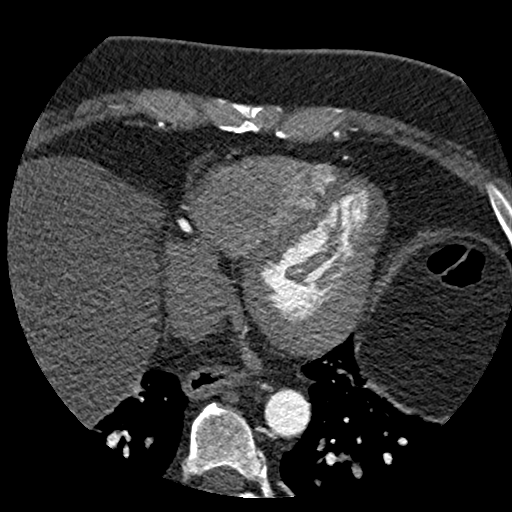
[im 115/276  vessel]
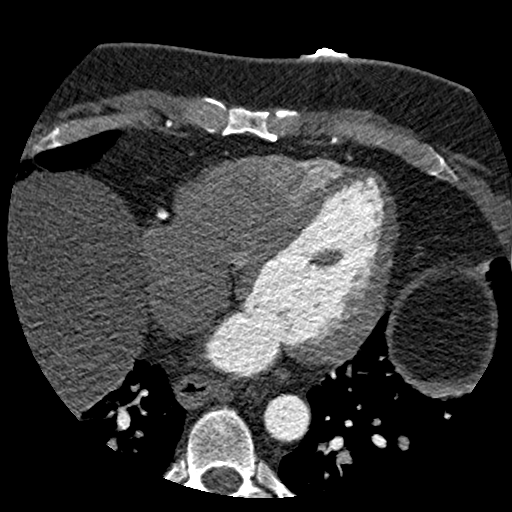
[im 115/276  lung]
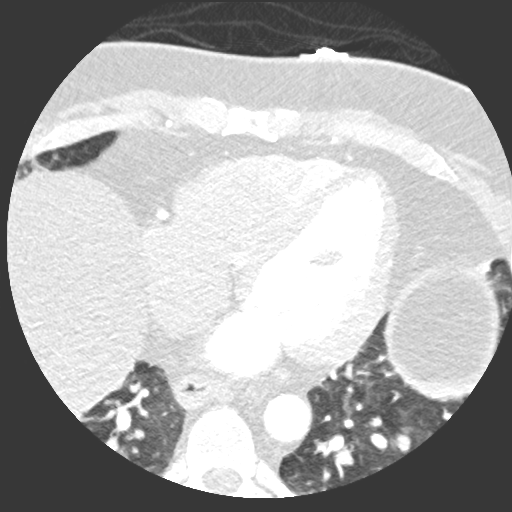
[im 138/276  vessel]
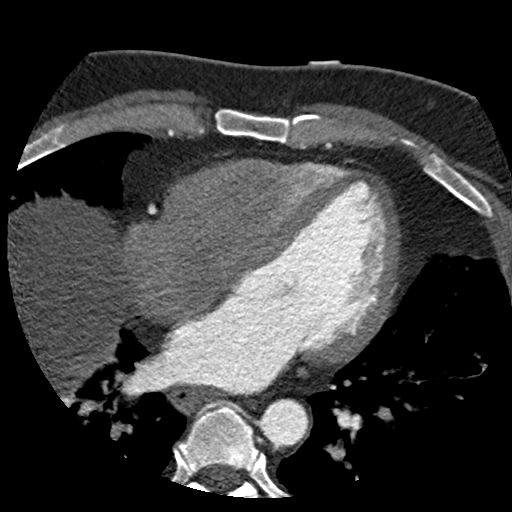
[im 161/276  vessel]
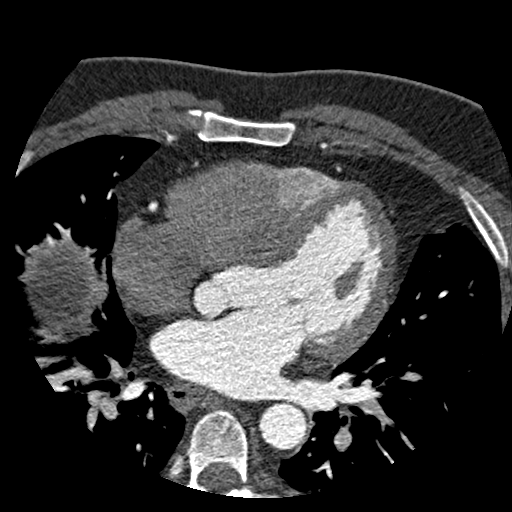
[im 184/276  vessel]
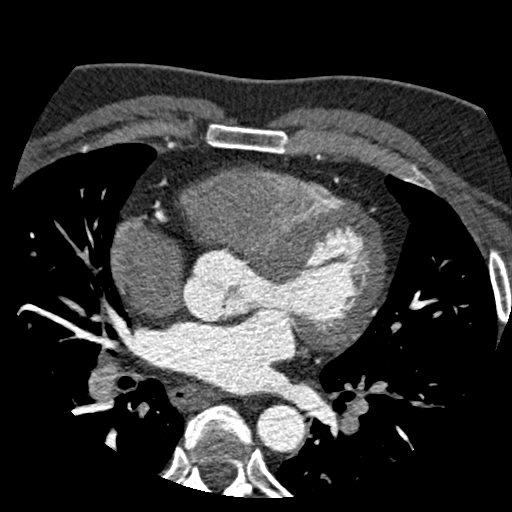
[im 207/276  vessel]
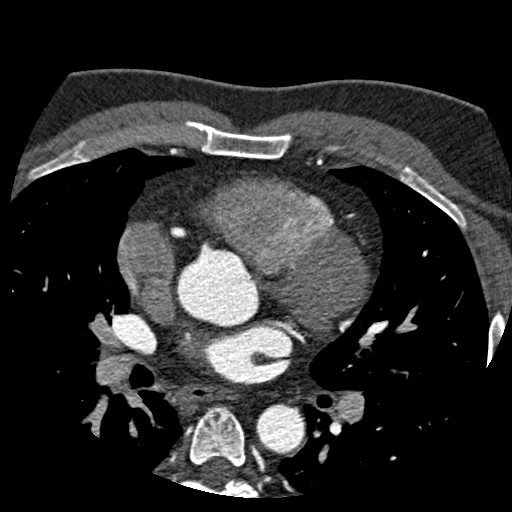
[im 207/276  lung]
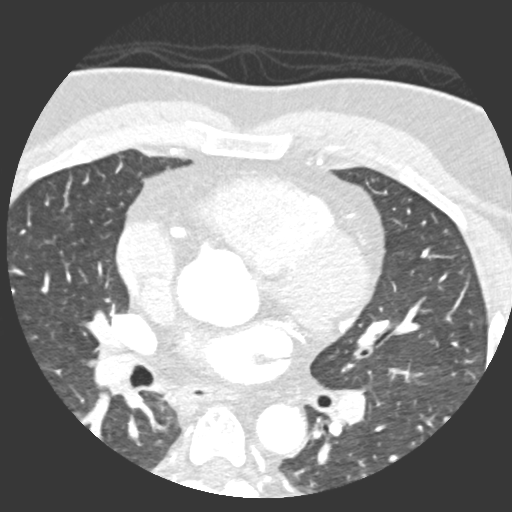
[im 230/276  vessel]
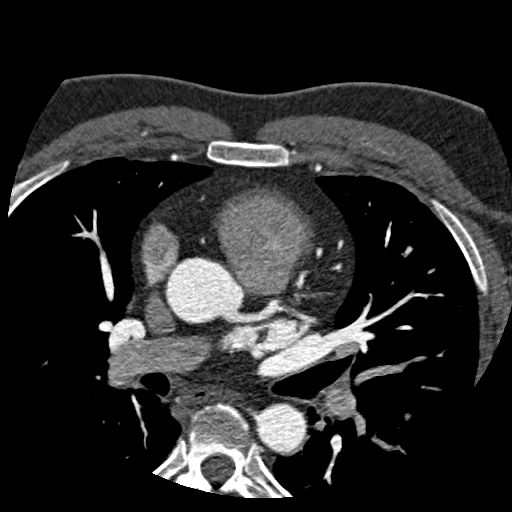
[im 253/276  vessel]
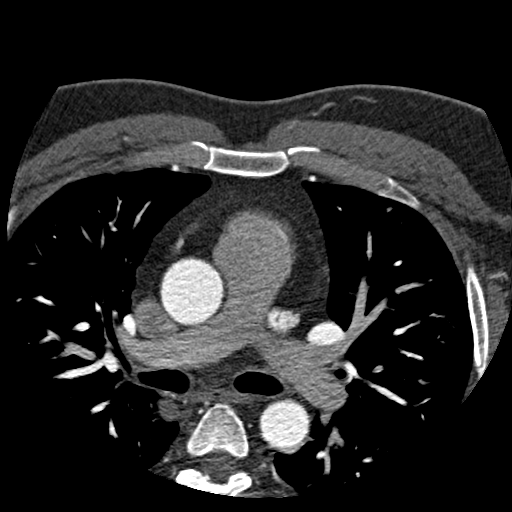

[11 of 20 positions shown; findings below may reference images not displayed]

FINDINGS: Technical quality:  Good

Heart rate:  62

CORONARY ARTERIES:
Left main coronary artery:  No coronary artery disease.
Left anterior descending:  There is a large D1 branch of the left
anterior descending artery. At the origin of the D1 branch there is
an the eccentric calcified and low density plaque which narrows the
vessel by approximately 50%. At the origin of the second diagonal
branch there is a eccentric calcified plaque without significant
stenosis.

Left circumflex:  No atherosclerotic disease
Right coronary artery:  No atherosclerotic disease.  Small acute
marginal branch without atherosclerotic disease.
Posterior descending artery:  No atherosclerotic disease.
Dominance:  Right

CORONARY CALCIUM:
Total Agatston Score:  42
[HOSPITAL] percentile:  81%

CARDIAC MEASUREMENTS:
Interventricular septum (6 - 12 mm):
LV posterior wall (6 - 12 mm):

LV diameter in diastole (35 - 52 mm):

AORTA AND PULMONARY MEASUREMENTS:
Aortic root (21 - 40 mm):
            29  at the annulus
            39  at the sinuses of Valsalva
            29  at the sinotubular junction
Ascending aorta ( <  40 mm):  31
Descending aorta ( <  40 mm):  23
Main pulmonary artery:  ( <  30 mm):  26

EXTRACARDIAC FINDINGS:
There is mild atelectasis at the lung bases.  No significant
pleural fluid.  No mediastinal adenopathy.  Review of the upper
abdomen unremarkable.  Limited view of the skeleton is
unremarkable.
IMPRESSION: 1.  Atherosclerotic coronary artery disease involving the left
anterior descending artery with no significant stenosis.

2..  Coronary artery calcium score equal 42 which is 81 st
percentile for patient's matches aging gender.

3.  Right coronary artery dominance.

[DATE].

## 2012-03-05 MED ORDER — IOHEXOL 350 MG/ML SOLN
80.0000 mL | Freq: Once | INTRAVENOUS | Status: AC | PRN
  Administered 2012-03-05: 80 mL via INTRAVENOUS

## 2012-03-05 MED ORDER — ACETAMINOPHEN 500 MG PO TABS: 1000.0000 mg | ORAL_TABLET | Freq: Once | ORAL | Status: DC

## 2012-03-05 MED ORDER — ACETAMINOPHEN 325 MG PO TABS
975.0000 mg | ORAL_TABLET | Freq: Once | ORAL | Status: AC
  Administered 2012-03-05: 975 mg via ORAL
  Filled 2012-03-05: qty 3

## 2012-03-05 MED ORDER — METOPROLOL TARTRATE 25 MG PO TABS
100.0000 mg | ORAL_TABLET | Freq: Once | ORAL | Status: AC
  Administered 2012-03-05: 100 mg via ORAL
  Filled 2012-03-05 (×2): qty 4

## 2012-03-05 MED ORDER — METOPROLOL TARTRATE 1 MG/ML IV SOLN
INTRAVENOUS | Status: AC
  Administered 2012-03-05: 5 mg
  Filled 2012-03-05: qty 10

## 2012-03-05 MED ORDER — ACETAMINOPHEN 500 MG PO TABS
1000.0000 mg | ORAL_TABLET | Freq: Once | ORAL | Status: DC
  Filled 2012-03-05: qty 2

## 2012-03-05 MED ORDER — NITROGLYCERIN 0.4 MG SL SUBL
0.4000 mg | SUBLINGUAL_TABLET | SUBLINGUAL | Status: DC | PRN
  Administered 2012-03-05: 0.4 mg via SUBLINGUAL
  Filled 2012-03-05: qty 25

## 2012-03-05 NOTE — ED Notes (Signed)
1101 PT returned to room

## 2012-03-05 NOTE — ED Notes (Signed)
1055 CT contrast given

## 2012-03-05 NOTE — Discharge Instructions (Signed)
Your coronary CT scan shows evidence of nonobstructive plaque in your coronary artery.  Please follow up with your doctor and with cardiologist for further management.  Return to ER if your chest pain worsen or if you have other concerns.    Chest Pain Observation It is often hard to give a specific diagnosis for the cause of chest pain. Your symptoms had a chance of being caused by inadequate oxygen delivery to your heart (angina). Angina that is not treated or evaluated can lead to a heart attack (myocardial infarction, MI) or death. Blood tests, electrocardiograms, and X-rays may have been done to help determine a possible cause of your chest pain. After evaluation and observation, your caregiver has determined that it is unlikely your pain was caused by angina. However, a full evaluation of your pain needs to be completed. You need to follow up with caregivers or diagnostic testing as directed. It is very important to keep your follow-up appointments. Not keeping your follow-up appointments could result in permanent heart damage, disability, or death. If there is any problem keeping your follow-up appointments, you must call your caregiver. HOME CARE INSTRUCTIONS  Due to the slight chance that your pain could be angina, it is important to follow healthy lifestyle habits and follow your caregiver's treatment plan:  Maintain a healthy weight.   Stay physically active and exercise regularly.   Decrease your salt intake.   Eat a diet low in saturated fats and cholesterol. Avoid foods fried in oil or made with fat. Talk to a dietician to learn about heart healthy foods.   Increase your fiber intake by including whole grains, vegetable, and fruits in your diet.   Avoid situations that cause stress, anger, or depression.   Take medication as advised by your caregiver. Report any side effects to your caregiver. Do not stop medications or adjust the dosages on your own.   Quit smoking. Do not use  nicotine patches or gum until you check with your caregiver.   Keep your blood pressure, blood sugar, and cholesterol levels within normal limits.   Limit alcohol intake to no more than 1 drink per day for nonpregnant women and 2 drinks per day for men.   Stop abusing drugs.  SEEK MEDICAL CARE IF: You have severe chest pain or pressure which may include symptoms such as:  Pain or pressure in the arms, neck, jaw, or back.   Profuse sweating.   Feeling sick to your stomach (nauseous).   Feeling short of breath while at rest.   Having a fast or irregular heartbeat.   You have chest pain that does not get better after rest or after taking your usual medicine.   You wake from sleep with chest pain.   You feel dizzy, faint, or experience extreme fatigue.   You notice increasing shortness of breath during rest, sleep, or with activity.   You are unable to sleep because you cannot breathe.   You develop a frequent cough or you are coughing up blood.   You have severe back or abdominal pain, are nauseated, or throw up (vomit).   You develop severe weakness, dizziness, fainting, or chills.  Any of these symptoms may represent a serious problem that is an emergency. Do not wait to see if the symptoms will go away. Call your local emergency services (911 in the [place] Do not drive yourself to the hospital. MAKE SURE YOU:  Understand these instructions.   Will watch your condition.   Will  get help right away if you are not doing well or get worse.  Document Released: 11/18/2010 Document Revised: 10/05/2011 Document Reviewed: 11/18/2010 [facility] Patient Information 2012 [place], [place].

## 2012-03-05 NOTE — ED Notes (Signed)
CT called for POC on CT test. PT vitals reviewed with CT Tech.

## 2012-03-05 NOTE — ED Notes (Signed)
1058 CT scan completed

## 2012-03-05 NOTE — ED Provider Notes (Signed)
Assumed care at 62:77 AM.  46 year old male presents with right-sided chest pain. Is under chest pain protocol in the CDU. Currently awaiting cardiacs CT angiography in the morning.  Patient is alert and oriented and in no apparent distress. On exam, heart was regular rate and rhythm, no murmurs, rubs, or gallops. Lungs clear to auscultation bilaterally. Abdomen is soft, nontender, with normal bowel sounds. No lower extremity edema noted with palpable pedal pulse.  12:44 PM Pt does not have any active chest pain.  Coronary CTA shows evidence of nonobstructive plaque.  Result discussed with patient. Pt agrees to f/u with his PCP and with cardiologist for further management.  Pt currently with staple vital sign.  Care instruction given.    *RADIOLOGY REPORT*  INDICATION: 46 year old male with left anterior chest pain. Short  of breath.  CT ANGIOGRAPHY OF THE HEART, CORONARY ARTERY, STRUCTURE, AND  MORPHOLOGY  CONTRAST: 80mL OMNIPAQUE IOHEXOL 350 MG/ML SOLN  COMPARISON: Chest radiograph 03/04/2012  TECHNIQUE: CT angiography of the coronary vessels was performed on  a 256 channel system using prospective ECG gating. A scout and  noncontrast exam (for calcium scoring) were performed. Circulation  time was measured using a test bolus. Coronary CTA was performed  with sub mm slice collimation during portions of the cardiac cycle  after prior injection of iodinated contrast. Imaging post  processing was performed on an independent workstation creating  multiplanar and 3-D images, and quantitative analysis of the heart  and coronary arteries. Note that this exam targets the heart and  the chest was not imaged in its entirety.  PREMEDICATION:  Lopressor 100 mg, P.O.  Lopressor 5 mg, IV  Nitroglycerin 400 mcg, sublingual.  FINDINGS:  Technical quality: Good  Heart rate: 62  CORONARY ARTERIES:  Left main coronary artery: No coronary artery disease.  Left anterior descending: There is a large D1  branch of the left  anterior descending artery. At the origin of the D1 branch there is  an the eccentric calcified and low density plaque which narrows the  vessel by approximately 50%. At the origin of the second diagonal  branch there is a eccentric calcified plaque without significant  stenosis.  Left circumflex: No atherosclerotic disease  Right coronary artery: No atherosclerotic disease. Small acute  marginal branch without atherosclerotic disease.  Posterior descending artery: No atherosclerotic disease.  Dominance: Right  CORONARY CALCIUM:  Total Agatston Score: 42  MESA database percentile: 81%  CARDIAC MEASUREMENTS:  Interventricular septum (6 - 12 mm):  LV posterior wall (6 - 12 mm):  LV diameter in diastole (35 - 52 mm):  AORTA AND PULMONARY MEASUREMENTS:  Aortic root (21 - 40 mm):  29 at the annulus  39 at the sinuses of Valsalva  29 at the sinotubular junction  Ascending aorta ( < 40 mm): 31  Descending aorta ( < 40 mm): 23  Main pulmonary artery: ( < 30 mm): 26  EXTRACARDIAC FINDINGS:  There is mild atelectasis at the lung bases. No significant  pleural fluid. No mediastinal adenopathy. Review of the upper  abdomen unremarkable. Limited view of the skeleton is  unremarkable.  IMPRESSION:  1. Atherosclerotic coronary artery disease involving the left  anterior descending artery with no significant stenosis.  2. Coronary artery calcium score equal 42 which is 81 st  percentile for patient's matches aging gender.  3. Right coronary artery dominance.  Report was called to CDU mid level at [phone] at 11:50 on  03/05/2012.   Fayrene Helper, PA-C 03/05/12  1248 

## 2012-03-05 NOTE — ED Notes (Signed)
1050 - CT scan started

## 2012-03-07 NOTE — ED Provider Notes (Signed)
Medical screening examination/treatment/procedure(s) were conducted as a shared visit with non-physician practitioner(s) and myself.  I personally evaluated the patient during the encounter   Dione Booze, MD 03/07/12 1511

## 2012-03-11 ENCOUNTER — Other Ambulatory Visit: Payer: Self-pay | Admitting: Family Medicine

## 2012-03-12 ENCOUNTER — Ambulatory Visit
Admission: RE | Admit: 2012-03-12 | Discharge: 2012-03-12 | Disposition: A | Discharge status: Home / Self Care | Payer: 59 | Source: Ambulatory Visit | Attending: Family Medicine | Admitting: Family Medicine

## 2012-03-12 IMAGING — US US ABDOMEN COMPLETE
1 series · 13 of 25 positions shown · non-contrast
Comparison: Limited correlation is made with a cardiac CT
performed [DATE].

CLINICAL DATA: Abdominal and chest pain.  History of hypertension.

COMPLETE ABDOMINAL ULTRASOUND

[Series 1: us abdomen complete · 0.37mm/px · 13 of 86 slices shown]
[im 1/86]
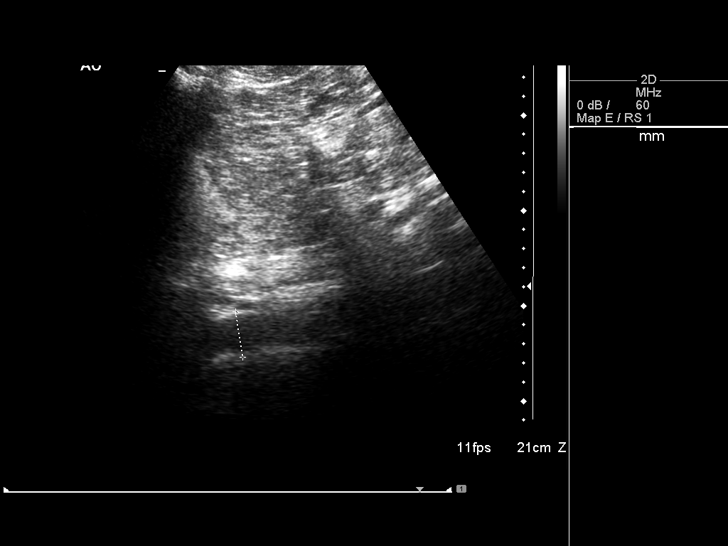
[im 8/86]
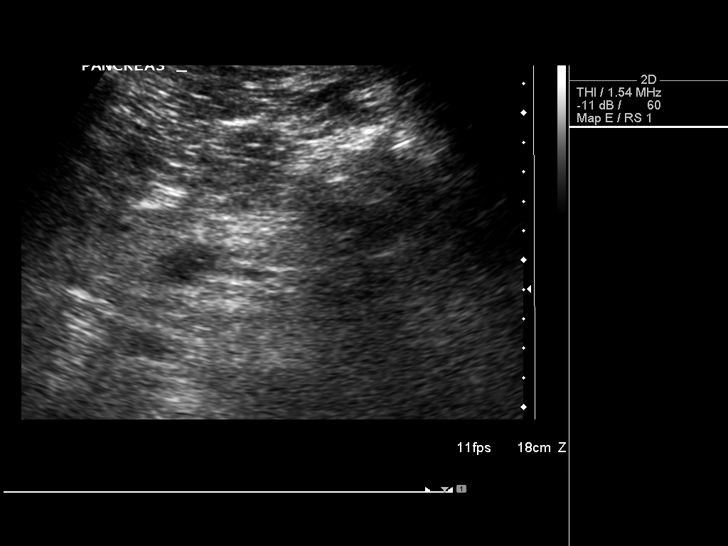
[im 15/86]
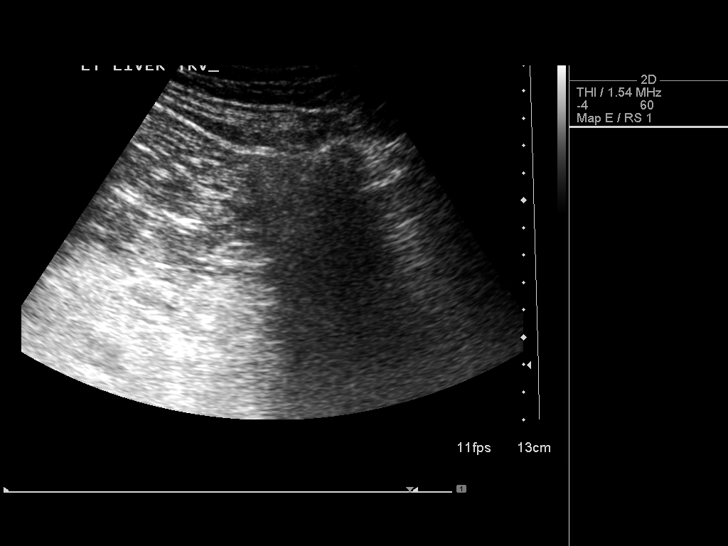
[im 22/86]
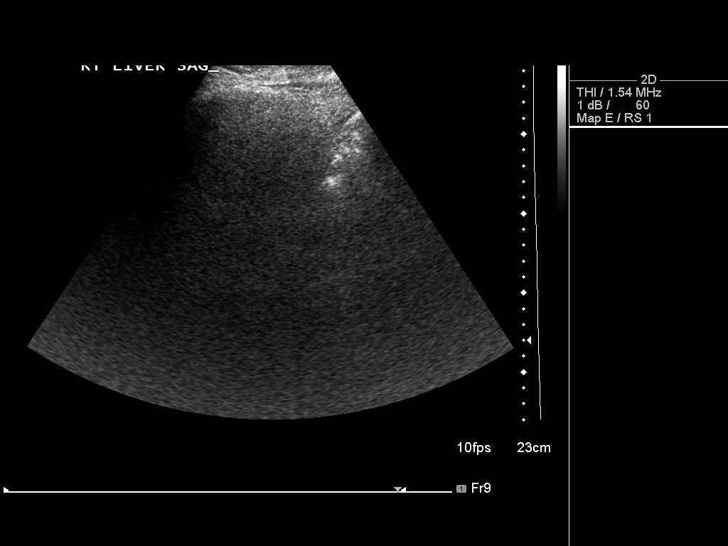
[im 29/86]
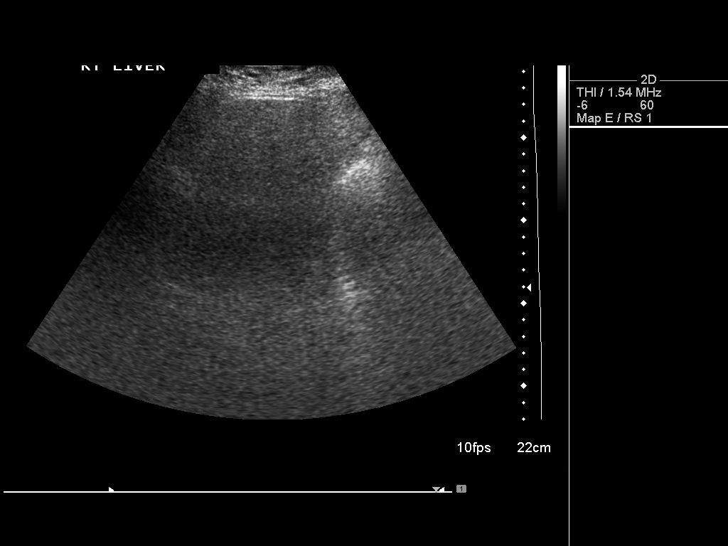
[im 36/86]
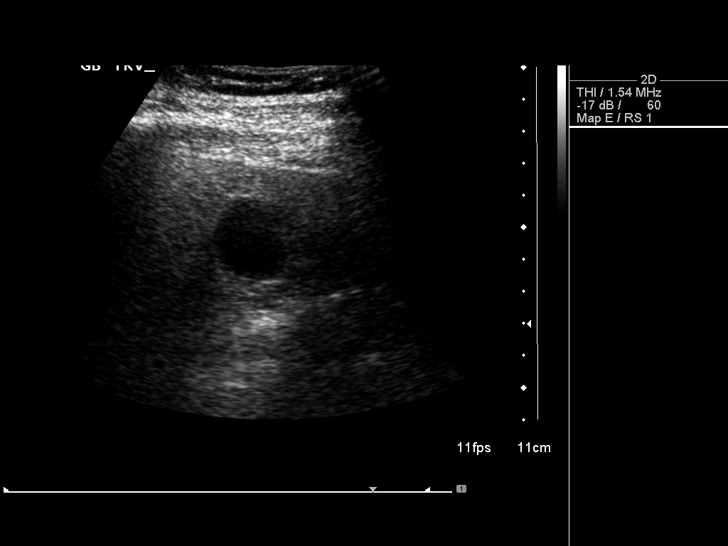
[im 43/86]
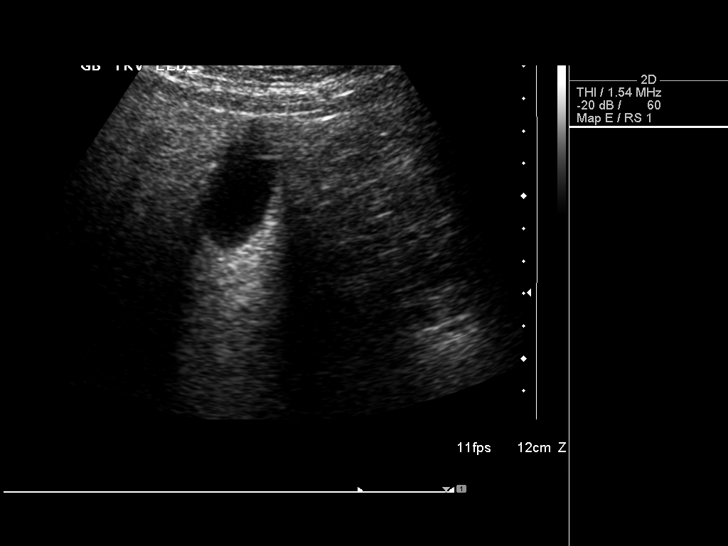
[im 50/86]
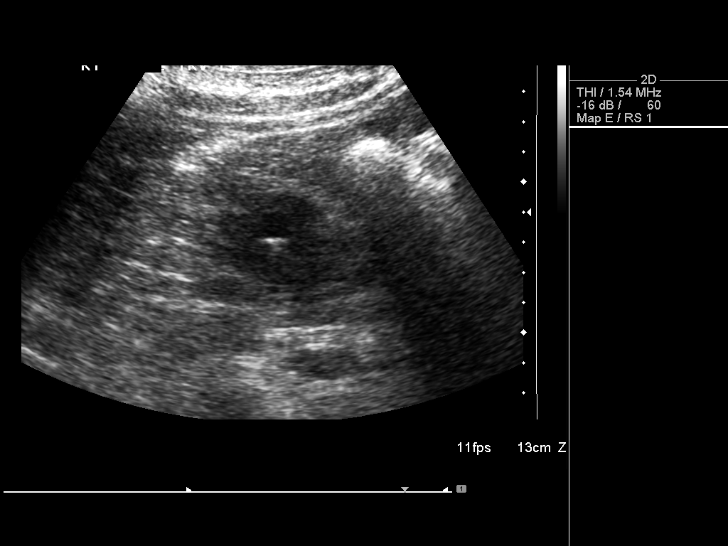
[im 57/86]
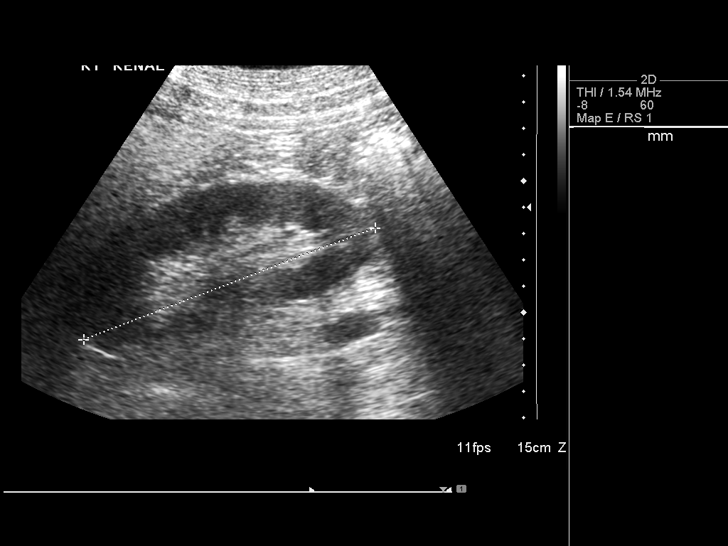
[im 64/86]
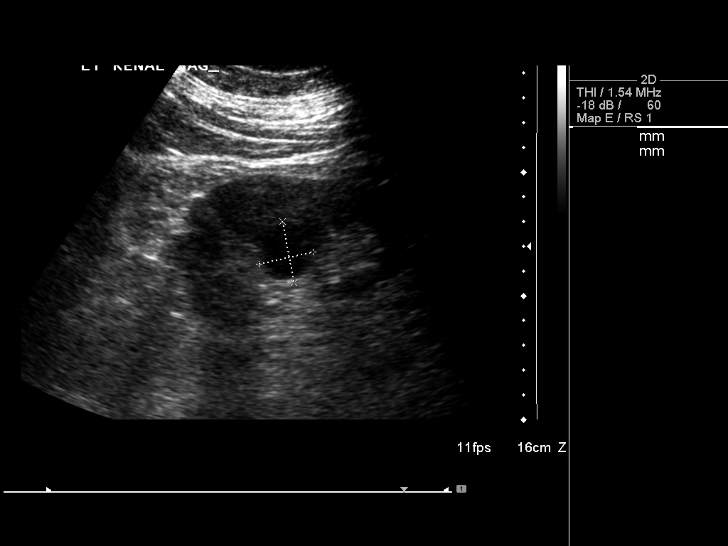
[im 71/86]
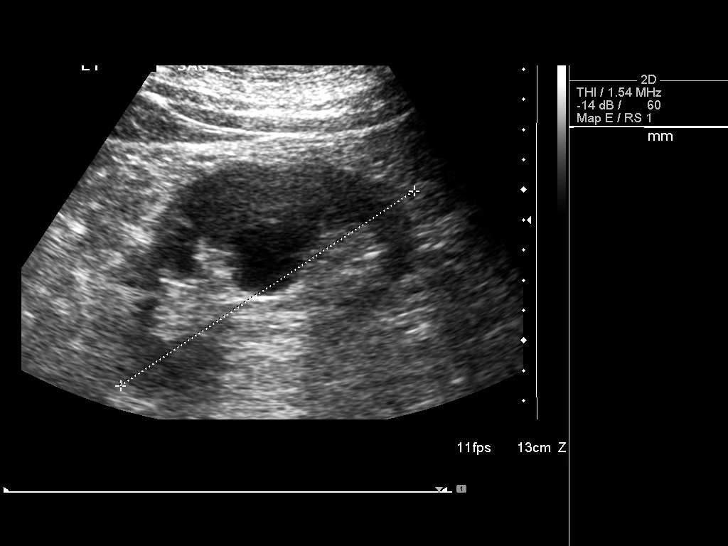
[im 78/86]
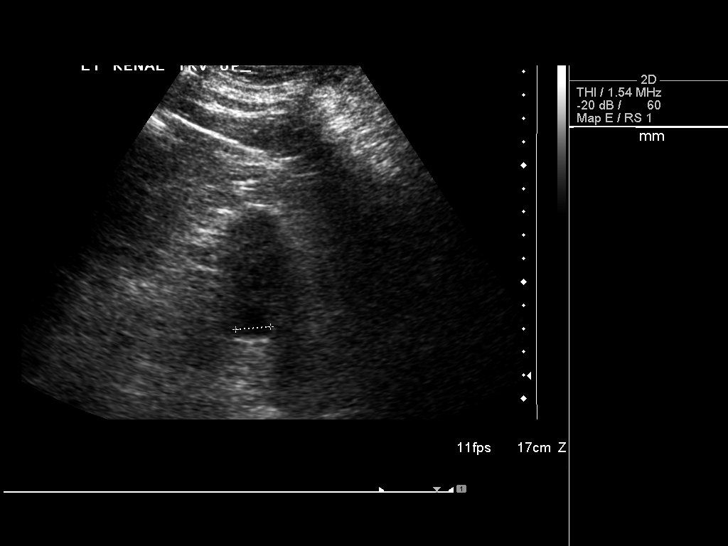
[im 86/86]
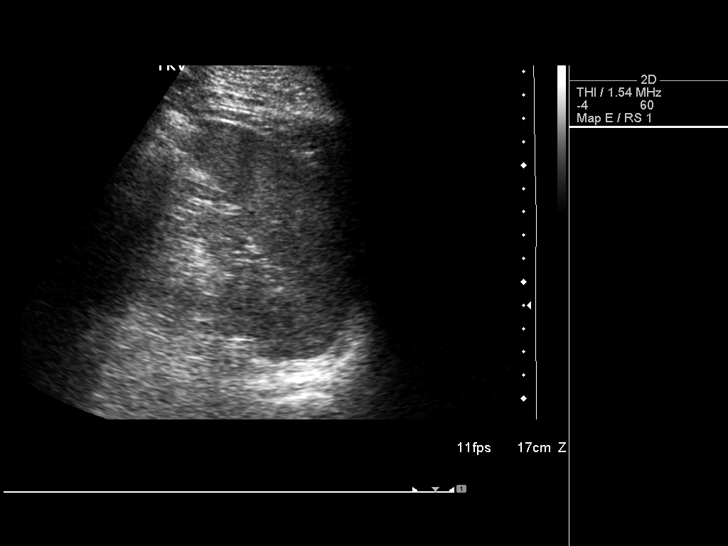

[13 of 25 positions shown; findings below may reference images not displayed]

FINDINGS: Gallbladder: Well distended without wall thickening, stones or
pericholecystic fluid. Negative sonographic Murphy's sign.

Common bile duct:   Normal in caliber without filling defects.

Liver:  Increased echogenicity corresponding with steatosis on CT.
No focal lesions identified.

IVC:  Visualized portions appear unremarkable.

Pancreas:  Visualized portions appear unremarkable.Portions of the
pancreas are obscured by bowel gas.

Spleen:  Visualized portions appear unremarkable.

Right Kidney:  There is a small cyst in the upper pole measuring
1.4 cm maximally. The renal cortical thickness and echogenicity are
preserved.  There is no hydronephrosis or other focal abnormality.
Renal length is 11.9 cm.

Left Kidney: There are small cysts, the largest in the interpolar
region measuring 2.5 cm maximally.  The renal cortical thickness
and echogenicity are preserved.  There is no hydronephrosis or
other focal abnormality. Renal length is 11.9 cm.

Abdominal aorta:  Visualized portions appear unremarkable.
IMPRESSION: 1.  No acute abdominal findings identified.
2.  Hepatic steatosis and renal cysts noted.

## 2014-06-01 ENCOUNTER — Encounter (INDEPENDENT_AMBULATORY_CARE_PROVIDER_SITE_OTHER): Payer: Self-pay | Admitting: General Surgery

## 2014-06-01 ENCOUNTER — Ambulatory Visit (INDEPENDENT_AMBULATORY_CARE_PROVIDER_SITE_OTHER): Payer: BC Managed Care – PPO | Admitting: General Surgery

## 2014-06-01 VITALS — BP 168/100 | HR 102 | Temp 97.9°F | Resp 16

## 2014-06-01 DIAGNOSIS — K4091 Unilateral inguinal hernia, without obstruction or gangrene, recurrent: Secondary | ICD-10-CM

## 2014-06-01 HISTORY — DX: Unilateral inguinal hernia, without obstruction or gangrene, recurrent: K40.91

## 2014-06-01 NOTE — Patient Instructions (Signed)
[facility] _______[facility]  UMBILICAL OR INGUINAL HERNIA REPAIR: POST OP INSTRUCTIONS  Always review your discharge instruction sheet given to you by the facility where your surgery was performed. IF YOU HAVE DISABILITY OR FAMILY LEAVE FORMS, YOU MUST BRING THEM TO THE OFFICE FOR PROCESSING.   DO NOT GIVE THEM TO YOUR DOCTOR.  1. A  prescription for pain medication may be given to you upon discharge.  Take your pain medication as prescribed, if needed.  If narcotic pain medicine is not needed, then you may take acetaminophen (Tylenol) or ibuprofen (Advil) as needed. 2. Take your usually prescribed medications unless otherwise directed. 3. If you need a refill on your pain medication, please contact your pharmacy.  They will contact our office to request authorization. Prescriptions will not be filled after 5 pm or on week-ends. 4. You should follow a light diet the first 24 hours after arrival home, such as soup and crackers, etc.  Be sure to include lots of fluids daily.  Resume your normal diet the day after surgery. 5. Most patients will experience some swelling and bruising around the umbilicus or in the groin and scrotum.  Ice packs and reclining will help.  Swelling and bruising can take several days to resolve.  6. It is common to experience some constipation if taking pain medication after surgery.  Increasing fluid intake and taking a stool softener (such as Colace) will usually help or prevent this problem from occurring.  A mild laxative (Milk of Magnesia or Miralax) should be taken according to package directions if there are no bowel movements after 48 hours. 7. Unless discharge instructions indicate otherwise, you may remove your bandages 24-48 hours after surgery, and you may shower at that time.  You may have steri-strips (small skin tapes) in place directly over the incision.  These strips should be left on the skin for 7-10 days.  If your surgeon used skin glue on the  incision, you may shower in 24 hours.  The glue will flake off over the next 2-3 weeks.  Any sutures or staples will be removed at the office during your follow-up visit. 8. ACTIVITIES:  You may resume regular (light) daily activities beginning the next day-such as daily self-care, walking, climbing stairs-gradually increasing activities as tolerated.  You may have sexual intercourse when it is comfortable.  Refrain from any heavy lifting or straining until approved by your doctor-nothing over 10 pounds for 6 weeks. a. You may drive when you are no longer taking prescription pain medication, you can comfortably wear a seatbelt, and you can safely maneuver your car and apply brakes. b. RETURN TO WORK:  _In approximately 6 weeks._________________________________________________________ 9. You should see your doctor in the office for a follow-up appointment approximately 2-3 weeks after your surgery.  Make sure that you call for this appointment within a day or two after you arrive home to insure a convenient appointment time. 10. OTHER INSTRUCTIONS:  __________________________________________________________________________________________________________________________________________________________________________________________  WHEN TO CALL YOUR DOCTOR: 1. Fever over 101.0 2. Inability to urinate 3. Nausea and/or vomiting 4. Extreme swelling or bruising 5. Continued bleeding from incision. 6. Increased pain, redness, or drainage from the incision  The clinic staff is available to answer your questions during regular business hours.  Please don't hesitate to call and ask to speak to one of the nurses for clinical concerns.  If you have a medical emergency, go to the nearest emergency room or call 911.  [facility]  is always on call at the hospital   [address], [place], [place], [place]  [address] ?  [address], [place], [place]   [address] [phone] ?  [phone] ? FAX [phone] Web site: [email or web address]

## 2014-06-01 NOTE — Progress Notes (Signed)
Patient ID: Brandon Villanueva, male   DOB: 1966/05/25, 48 y.o.   MRN: [id number]  Chief Complaint  Patient presents with  . hernia    HPI Brandon Villanueva is a 48 y.o. male.   HPI  He is referred by Dr. Milus Glazier because of a recurrent right inguinal hernia.  He was at work and slipped on a mat. He fell backwards against the machine. He felt a pulling in the right groin. He has had a previous right inguinal hernia repair with mesh in 2010.  He then began to have significant right groin pain escalated and the bulge. He was seen at urgent care and was diagnosed with a recurrent right inguinal hernia and then sent here for further evaluation. No symptoms of obstruction.  Past Medical History  Diagnosis Date  . Hypertension   . Arthritis     Past Surgical History  Procedure Laterality Date  . Ventral hernia repair      Repaired twice by Dr. Luretha Murphy  . Inguinal hernia repair Right 2010    History reviewed. No pertinent family history.  Social History History  Substance Use Topics  . Smoking status: Never Smoker   . Smokeless tobacco: Not on file  . Alcohol Use: No    No Known Allergies  Current Outpatient Prescriptions  Medication Sig Dispense Refill  . aspirin 81 MG tablet Take 81 mg by mouth daily.      . Glucosamine-Chondroitin (GLUCOSAMINE CHONDR COMPLEX PO) Take by mouth daily.      . Multiple Vitamin (DAILY VITAMIN PO) Take by mouth daily.      . perindopril (ACEON) 2 MG tablet Take 2 mg by mouth daily.       No current facility-administered medications for this visit.    Review of Systems Review of Systems  Constitutional: Negative.   Respiratory: Positive for shortness of breath (with  severe right groin pain).   Cardiovascular: Negative.   Gastrointestinal: Positive for constipation.  Genitourinary: Positive for frequency. Negative for difficulty urinating.  Musculoskeletal:       Right groin pain  Neurological: Positive for headaches.  Hematological:  Negative.     Blood pressure 168/100, pulse 102, temperature 97.9 F (36.6 C), temperature source Oral, resp. rate 16.  Physical Exam Physical Exam  Constitutional: No distress.  overweight  HENT:  Head: Normocephalic and atraumatic.  Cardiovascular: Normal rate and regular rhythm.   Pulmonary/Chest: Effort normal and breath sounds normal.  Abdominal: Soft.  Obese. Upper abdominal scar with some fullness inferior to it.  Genitourinary:  Right groin scar. Right groin bulge that is reducible. No left groin bulge. Testicles are descended. No obvious masses in the testicles.  Musculoskeletal: He exhibits no edema.  Neurological: He is alert.  Skin: Skin is warm and dry.  Psychiatric: He has a normal mood and affect. His behavior is normal.    Data Reviewed Notes from urgent care.  Assessment    Recurrent right inguinal hernia that is symptomatic enough to keep him out of work.     Plan    I recommended laparoscopic repair of recurrent right inguinal hernia with mesh.  I have explained the procedure, risks, and aftercare of inguinal hernia repair.  Risks include but are not limited to bleeding, infection, wound problems, anesthesia, recurrence, bladder or intestine injury, urinary retention, testicular dysfunction, chronic pain, mesh problems.  He seems to understand and would like to proceed. He'll likely will be out of work at least 6 weeks after  the date of surgery.       Tanetta Fuhriman J 06/01/2014, 9:02 AM

## 2014-06-09 DIAGNOSIS — K4091 Unilateral inguinal hernia, without obstruction or gangrene, recurrent: Secondary | ICD-10-CM

## 2014-06-10 ENCOUNTER — Other Ambulatory Visit (INDEPENDENT_AMBULATORY_CARE_PROVIDER_SITE_OTHER): Payer: Self-pay

## 2014-06-10 MED ORDER — OXYCODONE HCL 5 MG PO TABS: 5.0000 mg | ORAL_TABLET | ORAL | Status: DC | PRN

## 2014-06-24 ENCOUNTER — Ambulatory Visit (INDEPENDENT_AMBULATORY_CARE_PROVIDER_SITE_OTHER): Payer: BC Managed Care – PPO | Admitting: General Surgery

## 2014-06-24 VITALS — BP 142/92 | HR 85 | Temp 98.0°F | Ht 67.0 in | Wt 239.1 lb

## 2014-06-24 DIAGNOSIS — Z4889 Encounter for other specified surgical aftercare: Secondary | ICD-10-CM

## 2014-06-24 NOTE — Patient Instructions (Signed)
6 weeks after the surgery, may resume normal activities as tolerated, as discussed. 

## 2014-06-24 NOTE — Progress Notes (Signed)
He presents for postop followup after laparoscopic recurrent right inguinal hernia repair with mesh.  Post op pain is improving.  No difficulty voiding or having BMs.  Swelling is decreasing.  P.E.  ABD:  Soft, incisions clean/dry/intact  GU:   swelling is minimal, repair is solid.  Assessment:  Doing well post hernia repair.  Plan:  Continue light activities for 6 weeks postop then slowly start to resume normal activities as tolerated (no pain or discomfort), including work. Return visit as needed.

## 2015-04-19 ENCOUNTER — Encounter (HOSPITAL_COMMUNITY): Payer: Self-pay | Admitting: Emergency Medicine

## 2015-04-19 ENCOUNTER — Emergency Department (HOSPITAL_COMMUNITY)
Admission: EM | Admit: 2015-04-19 | Discharge: 2015-04-19 | Disposition: A | Discharge status: Home / Self Care | Payer: BLUE CROSS/BLUE SHIELD | Attending: Emergency Medicine | Admitting: Emergency Medicine

## 2015-04-19 ENCOUNTER — Emergency Department (HOSPITAL_COMMUNITY): Payer: BLUE CROSS/BLUE SHIELD

## 2015-04-19 DIAGNOSIS — I10 Essential (primary) hypertension: Secondary | ICD-10-CM | POA: Insufficient documentation

## 2015-04-19 DIAGNOSIS — Z79899 Other long term (current) drug therapy: Secondary | ICD-10-CM | POA: Insufficient documentation

## 2015-04-19 DIAGNOSIS — R0789 Other chest pain: Secondary | ICD-10-CM | POA: Diagnosis not present

## 2015-04-19 DIAGNOSIS — R0602 Shortness of breath: Secondary | ICD-10-CM | POA: Insufficient documentation

## 2015-04-19 DIAGNOSIS — M1990 Unspecified osteoarthritis, unspecified site: Secondary | ICD-10-CM | POA: Insufficient documentation

## 2015-04-19 DIAGNOSIS — R079 Chest pain, unspecified: Secondary | ICD-10-CM | POA: Diagnosis present

## 2015-04-19 DIAGNOSIS — Z7982 Long term (current) use of aspirin: Secondary | ICD-10-CM | POA: Diagnosis not present

## 2015-04-19 DIAGNOSIS — R61 Generalized hyperhidrosis: Secondary | ICD-10-CM | POA: Diagnosis not present

## 2015-04-19 LAB — BASIC METABOLIC PANEL
Anion gap: 6 (ref 5–15)
BUN: 9 mg/dL (ref 6–20)
CO2: 26 mmol/L (ref 22–32)
CREATININE: 0.86 mg/dL (ref 0.61–1.24)
Calcium: 9 mg/dL (ref 8.9–10.3)
Chloride: 107 mmol/L (ref 101–111)
Glucose, Bld: 130 mg/dL — ABNORMAL HIGH (ref 65–99)
Potassium: 3.9 mmol/L (ref 3.5–5.1)
Sodium: 139 mmol/L (ref 135–145)

## 2015-04-19 LAB — CBC
HCT: 43.2 % (ref 39.0–52.0)
HEMOGLOBIN: 14.3 g/dL (ref 13.0–17.0)
MCH: 29.3 pg (ref 26.0–34.0)
MCHC: 33.1 g/dL (ref 30.0–36.0)
MCV: 88.5 fL (ref 78.0–100.0)
PLATELETS: 242 10*3/uL (ref 150–400)
RBC: 4.88 MIL/uL (ref 4.22–5.81)
RDW: 13.1 % (ref 11.5–15.5)
WBC: 9.1 10*3/uL (ref 4.0–10.5)

## 2015-04-19 LAB — I-STAT TROPONIN, ED: TROPONIN I, POC: 0 ng/mL (ref 0.00–0.08)

## 2015-04-19 IMAGING — CR DG CHEST 1V PORT
1 series · 1 of 1 positions shown · non-contrast
Comparison: None.

CLINICAL DATA: Left-sided chest pain

EXAM:
PORTABLE CHEST - 1 VIEW

[AP]
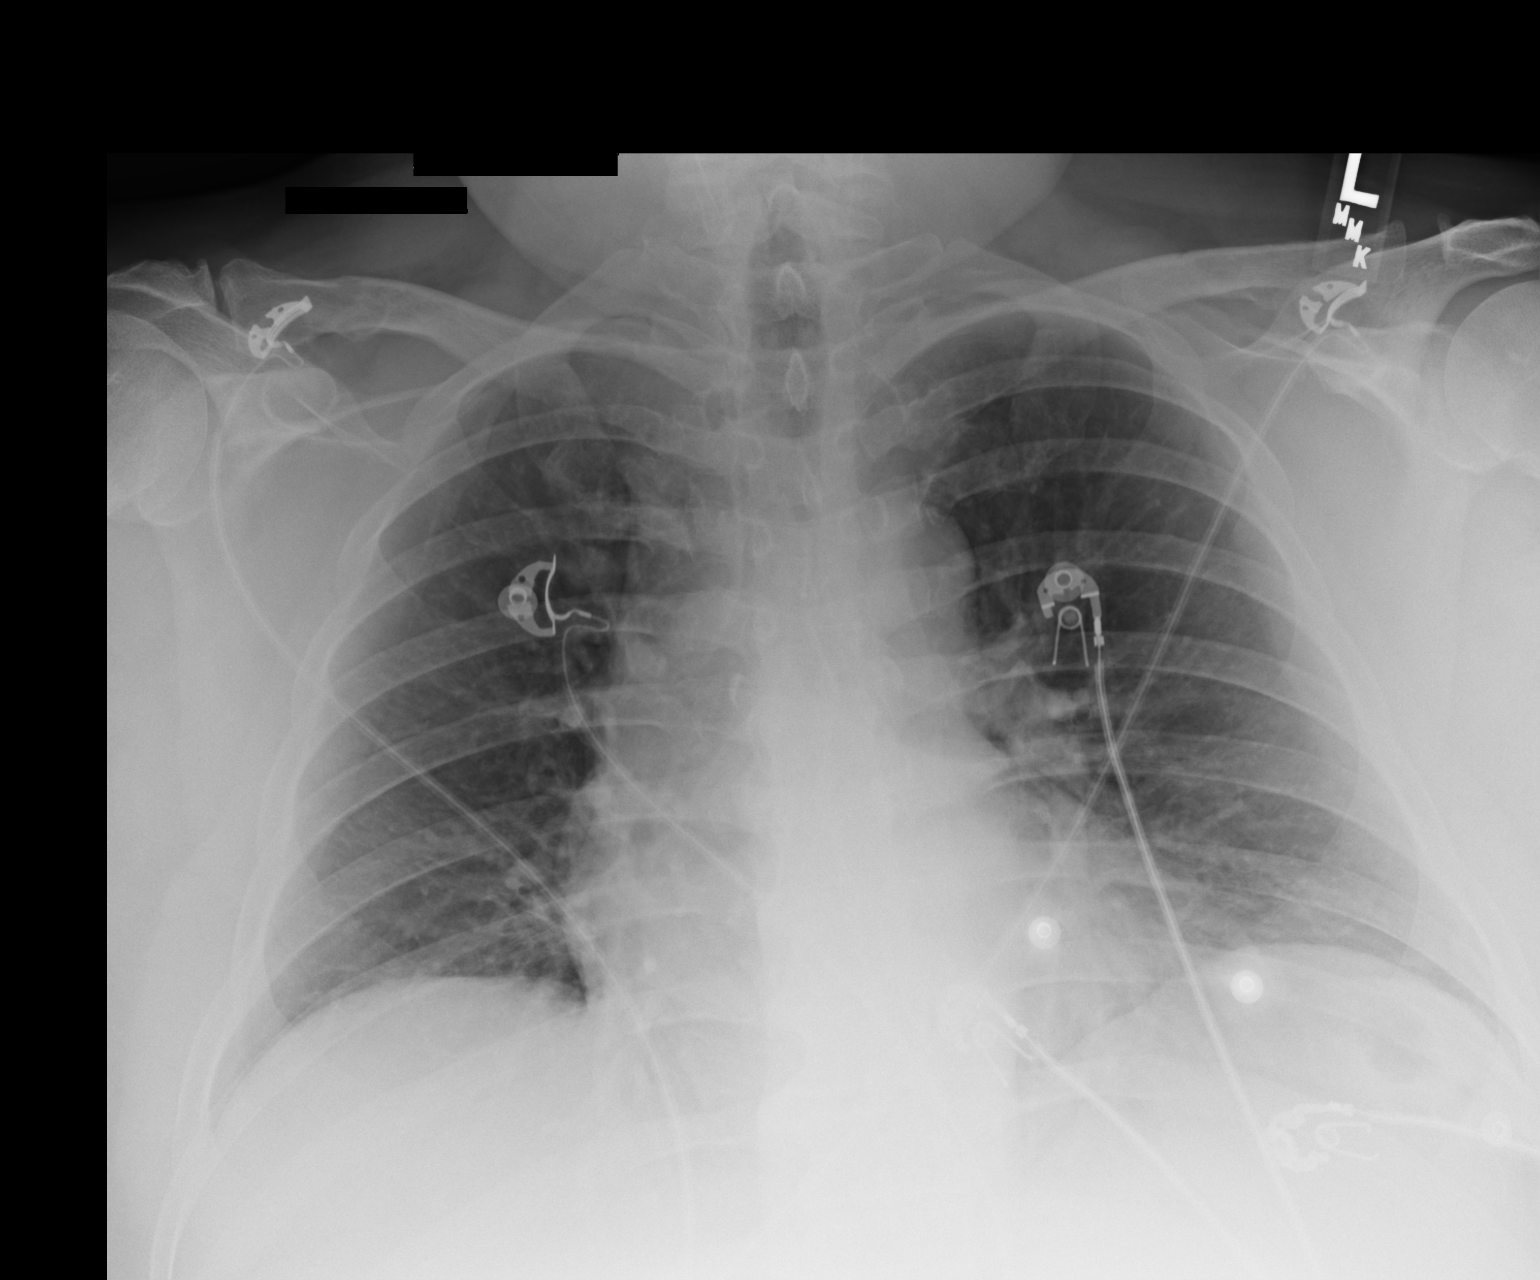

[1 of 1 positions shown; findings below may reference images not displayed]

FINDINGS: The heart size and mediastinal contours are within normal limits.
Both lungs are clear. The visualized skeletal structures are
unremarkable.
IMPRESSION: No active disease.

## 2015-04-19 NOTE — ED Provider Notes (Signed)
CSN: [id number]     Arrival date & time 04/19/15  1417 History   First MD Initiated Contact with Patient 04/19/15 1422     Chief Complaint  Patient presents with  . Chest Pain     (Consider location/radiation/quality/duration/timing/severity/associated sxs/prior Treatment) Patient is a 49 y.o. male presenting with chest pain.  Chest Pain Pain location:  L chest Pain quality comment:  Feels like a "pulled muscle." Pain radiates to:  Does not radiate Pain radiates to the back: no   Pain severity:  Moderate Onset quality:  Sudden Duration:  6 hours Timing:  Constant Progression:  Resolved Chronicity:  New Context: movement   Relieved by: lying still and NTG. Worsened by:  Movement (of UE) Associated symptoms: diaphoresis and shortness of breath   Associated symptoms: no abdominal pain, no anorexia, no anxiety, no back pain, no claudication, no cough, no dizziness, no fatigue, no fever, no headache, no heartburn, no lower extremity edema, no nausea, no numbness, no palpitations, no syncope, not vomiting and no weakness   Risk factors: hypertension, male sex and obesity   Risk factors: no coronary artery disease, no diabetes mellitus, no high cholesterol, no immobilization, no prior DVT/PE, no smoking and no surgery     Past Medical History  Diagnosis Date  . Hypertension   . Arthritis    Past Surgical History  Procedure Laterality Date  . Ventral hernia repair      Repaired twice by Dr. Luretha Murphy  . Inguinal hernia repair Right 2010   History reviewed. No pertinent family history. History  Substance Use Topics  . Smoking status: Never Smoker   . Smokeless tobacco: Not on file  . Alcohol Use: No    Review of Systems  Constitutional: Positive for diaphoresis. Negative for fever, chills, appetite change and fatigue.  HENT: Negative for congestion, ear pain, facial swelling, mouth sores and sore throat.   Eyes: Negative for visual disturbance.  Respiratory: Positive  for shortness of breath. Negative for cough and chest tightness.   Cardiovascular: Positive for chest pain. Negative for palpitations, claudication and syncope.  Gastrointestinal: Negative for heartburn, nausea, vomiting, abdominal pain, diarrhea, blood in stool and anorexia.  Endocrine: Negative for cold intolerance and heat intolerance.  Genitourinary: Negative for frequency, decreased urine volume and difficulty urinating.  Musculoskeletal: Negative for back pain and neck stiffness.  Skin: Negative for rash.  Neurological: Negative for dizziness, weakness, light-headedness, numbness and headaches.  All other systems reviewed and are negative.     Allergies  Review of patient's allergies indicates no known allergies.  Home Medications   Prior to Admission medications   Medication Sig Start Date End Date Taking? Authorizing Provider  aspirin 81 MG tablet Take 81 mg by mouth daily.   Yes Historical Provider, MD  Glucosamine-Chondroitin (GLUCOSAMINE CHONDR COMPLEX PO) Take by mouth daily.   Yes Historical Provider, MD  perindopril (ACEON) 2 MG tablet Take 2 mg by mouth daily.   Yes Historical Provider, MD  oxyCODONE (OXY IR/ROXICODONE) 5 MG immediate release tablet Take 1-2 tablets (5-10 mg total) by mouth every 4 (four) hours as needed for moderate pain, severe pain or breakthrough pain. Patient not taking: Reported on 04/19/2015 06/10/14   Avel Peace, MD   BP 135/71 mmHg  Pulse 88  Temp(Src) 98 F (36.7 C) (Oral)  Resp 20  SpO2 97% Physical Exam  Constitutional: He is oriented to person, place, and time. He appears well-nourished. No distress.  HENT:  Head: Normocephalic and atraumatic.  Right Ear: External ear normal.  Left Ear: External ear normal.  Eyes: Pupils are equal, round, and reactive to light. Right eye exhibits no discharge. Left eye exhibits no discharge. No scleral icterus.  Neck: Normal range of motion. Neck supple.  Cardiovascular: Normal rate.  Exam  reveals no gallop and no friction rub.   No murmur heard. Pulmonary/Chest: Effort normal and breath sounds normal. No stridor. No respiratory distress. He has no wheezes. He has no rales. He exhibits no tenderness.    TTP at 3rd ICS just left of the sternum, worse when pt reaches down and around to his back.  Abdominal: Soft. He exhibits no distension and no mass. There is no tenderness. There is no rebound and no guarding.  Musculoskeletal: He exhibits no edema or tenderness.  Neurological: He is alert and oriented to person, place, and time.  Skin: Skin is warm and dry. No rash noted. He is not diaphoretic. No erythema.    ED Course  Procedures (including critical care time) Labs Review Labs Reviewed  BASIC METABOLIC PANEL - Abnormal; Notable for the following:    Glucose, Bld 130 (*)    All other components within normal limits  CBC  I-STAT TROPOININ, ED    Imaging Review Dg Chest Port 1 View  04/19/2015   CLINICAL DATA:  Left-sided chest pain  EXAM: PORTABLE CHEST - 1 VIEW  COMPARISON:  None.  FINDINGS: The heart size and mediastinal contours are within normal limits. Both lungs are clear. The visualized skeletal structures are unremarkable.  IMPRESSION: No active disease.   Electronically Signed   By: Alcide Clever M.D.   On: 04/19/2015 15:05     EKG Interpretation   Date/Time:  Monday April 19 2015 15:06:05 EDT Ventricular Rate:  92 PR Interval:  139 QRS Duration: 93 QT Interval:  355 QTC Calculation: 439 R Axis:   76 Text Interpretation:  Sinus rhythm since last tracing no significant  change Confirmed by BELFI  MD, MELANIE ([id number]) on 04/19/2015 3:17:40 PM      MDM   [age]-year-old male with a history of hypertension presents with sudden onset of left chest wall point tenderness noted after reaching around his back with his left hand trying to adjust they were going machine while at work. Rest of the history and exam as above. Additionally patient has had a stress  test within the last 5 years that was negative. History and exam consistent with MSK pain. EKG with normal sinus rhythm, intervals, axis. No acute ischemia, arrhythmia, or blocks. Troponin negative. CBC without cytosis or anemia. BMP unremarkable. Chest x-ray without evidence of pneumonia, pneumothorax, pneumomediastinum, or widening or abnormal contour to the mediastinum, pleural effusions. Low suspicion for ACS. Patient's is PERC negative, low suspicion for PE. Doubt dissection.  Patient condition and safe for discharge with strict return precautions. Patient to follow-up with his PCP.  Patient seen in conjunction with Dr. Fredderick Phenix.  Final diagnoses:  Left-sided chest wall pain        Drema Pry, MD 04/19/15 1558  Rolan Bucco, MD 04/21/15 1506

## 2015-04-19 NOTE — ED Notes (Addendum)
Per EMS- pt presents to ED from doctors office with complaint of left sided chest tightness associated with shortness of breath. Pt was working when this tightness began, he paints cars for a living. Pt received 3 nitro in route, pain was initially 4/10, is now a 1/10. Pt sats in low 90's, placed on 2 L by EMS. BP 139/90, HR @ 105 ST. Pt is a/o x4. VSS. Pt received 324 of aspirin at doctors office.

## 2015-04-19 NOTE — Discharge Instructions (Signed)

## 2015-06-30 ENCOUNTER — Ambulatory Visit: Payer: BLUE CROSS/BLUE SHIELD | Admitting: Cardiovascular Disease

## 2015-07-06 ENCOUNTER — Encounter: Payer: Self-pay | Admitting: *Deleted

## 2015-07-07 ENCOUNTER — Ambulatory Visit (INDEPENDENT_AMBULATORY_CARE_PROVIDER_SITE_OTHER)
Admission: RE | Admit: 2015-07-07 | Discharge: 2015-07-07 | Disposition: A | Discharge status: Home / Self Care | Payer: BLUE CROSS/BLUE SHIELD | Source: Ambulatory Visit | Attending: Cardiology | Admitting: Cardiology

## 2015-07-07 ENCOUNTER — Ambulatory Visit (INDEPENDENT_AMBULATORY_CARE_PROVIDER_SITE_OTHER): Payer: BLUE CROSS/BLUE SHIELD | Admitting: Cardiology

## 2015-07-07 ENCOUNTER — Encounter: Payer: Self-pay | Admitting: Cardiology

## 2015-07-07 ENCOUNTER — Telehealth: Payer: Self-pay

## 2015-07-07 VITALS — BP 150/90 | HR 94 | Ht 67.0 in | Wt 242.0 lb

## 2015-07-07 DIAGNOSIS — R0683 Snoring: Secondary | ICD-10-CM | POA: Insufficient documentation

## 2015-07-07 DIAGNOSIS — R079 Chest pain, unspecified: Secondary | ICD-10-CM

## 2015-07-07 DIAGNOSIS — G4719 Other hypersomnia: Secondary | ICD-10-CM | POA: Insufficient documentation

## 2015-07-07 HISTORY — DX: Other hypersomnia: G47.19

## 2015-07-07 HISTORY — DX: Morbid (severe) obesity due to excess calories: E66.01

## 2015-07-07 HISTORY — DX: Snoring: R06.83

## 2015-07-07 HISTORY — DX: Chest pain, unspecified: R07.9

## 2015-07-07 IMAGING — CT CT HEART SCORING
1 of 3 series · 10 of 20 positions shown, 13 images · non-contrast
Comparison: Chest radiograph of [DATE].  CT of [DATE].

EXAM:
OVER-READ INTERPRETATION  CT CHEST

The following report is an over-read performed by radiologist Dr.
does not include interpretation of cardiac or coronary anatomy or
pathology. The calcium score interpretation by the cardiologist is
attached.
CLINICAL DATA: Risk stratification
Coronary Calcium Score
MEDICATIONS:
None.
TECHNIQUE: The patient was scanned on a Siemens Sensation 16 slice scanner.
Axial non-contrast 3 mm slices were carried out through the heart.
The data set was analyzed on a dedicated work station and scored
using the Agatson method.

[Series 6: st thins for reformat · axial · 0.74mm/px · z∈[-219,-148]mm · 10 of 87 slices shown, 13 images]
[im 8/87  vessel]
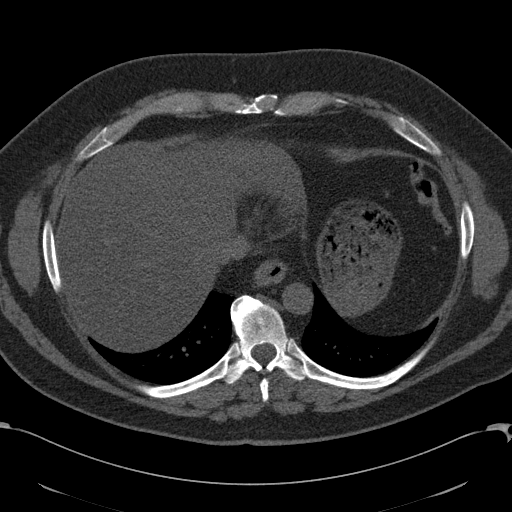
[im 8/87  lung]
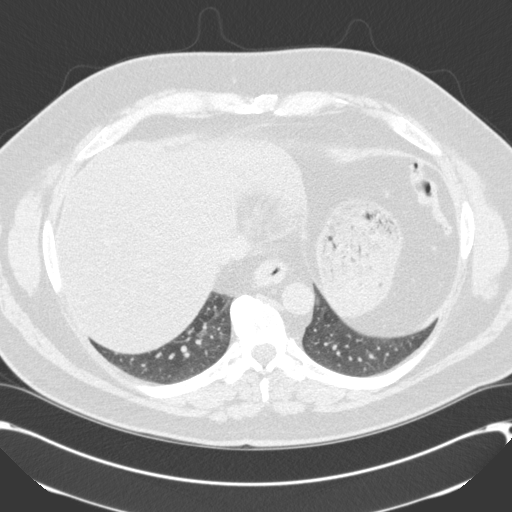
[im 16/87  vessel]
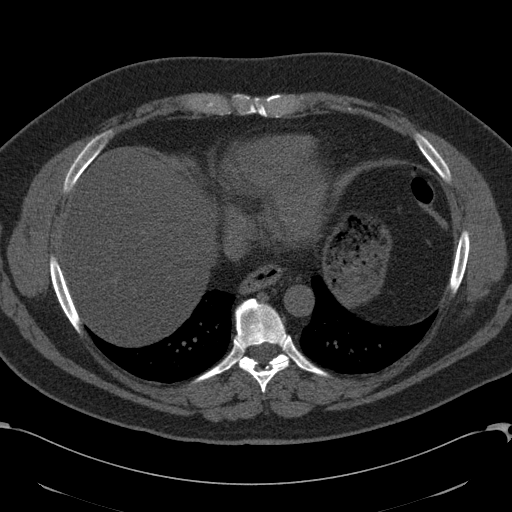
[im 24/87  vessel]
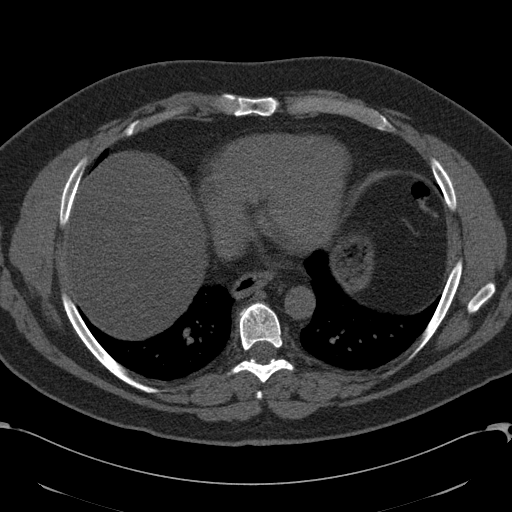
[im 32/87  vessel]
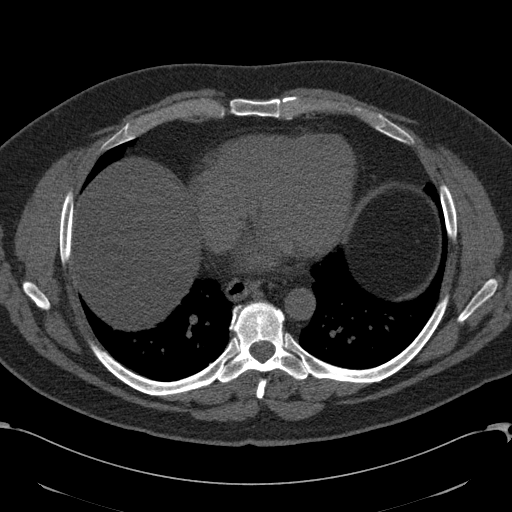
[im 40/87  vessel]
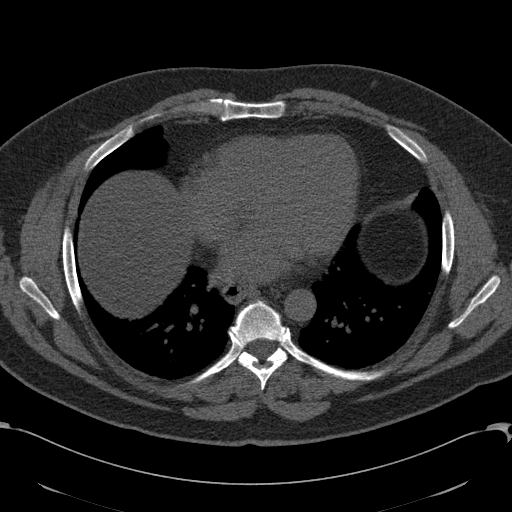
[im 40/87  lung]
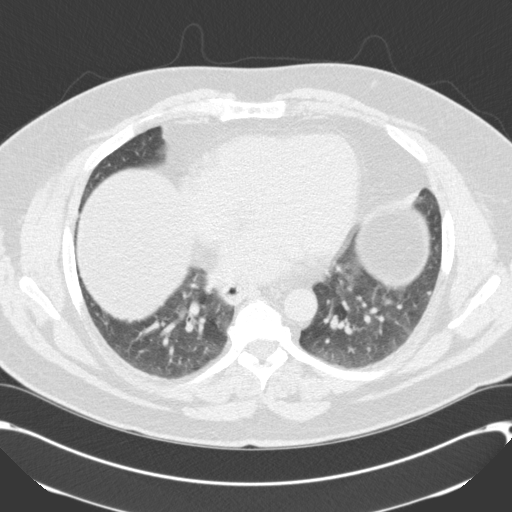
[im 47/87  vessel]
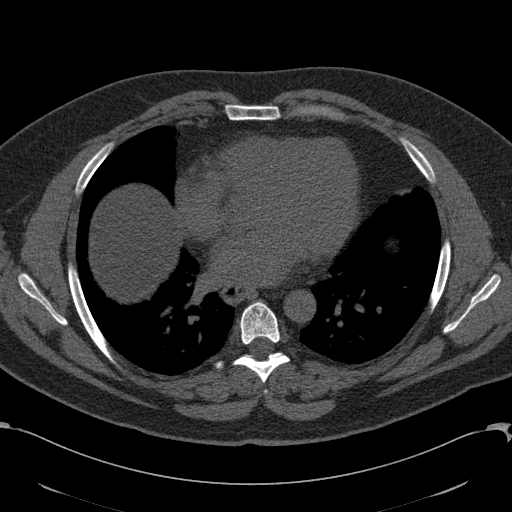
[im 55/87  vessel]
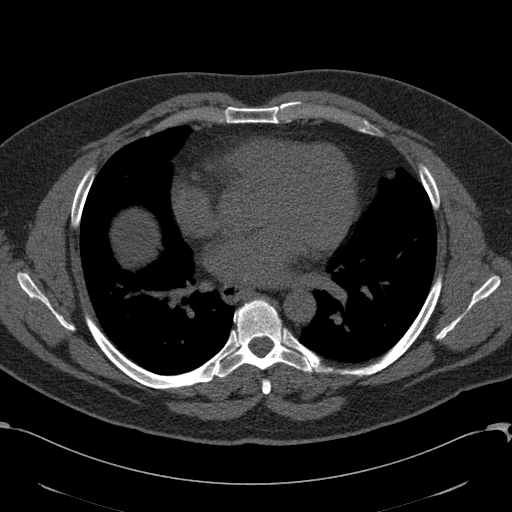
[im 63/87  vessel]
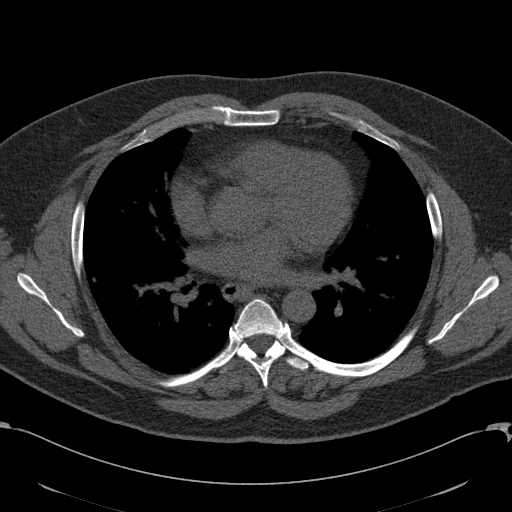
[im 71/87  vessel]
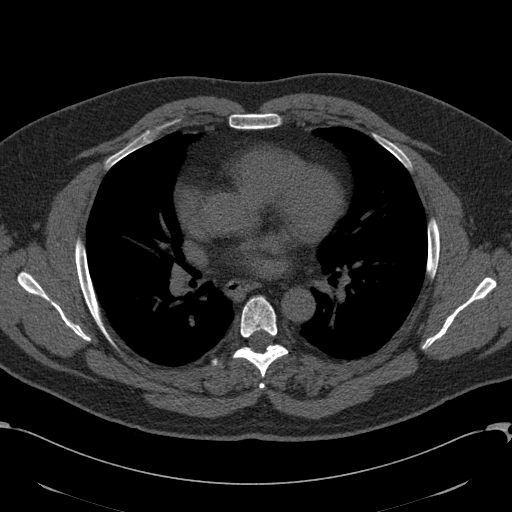
[im 71/87  lung]
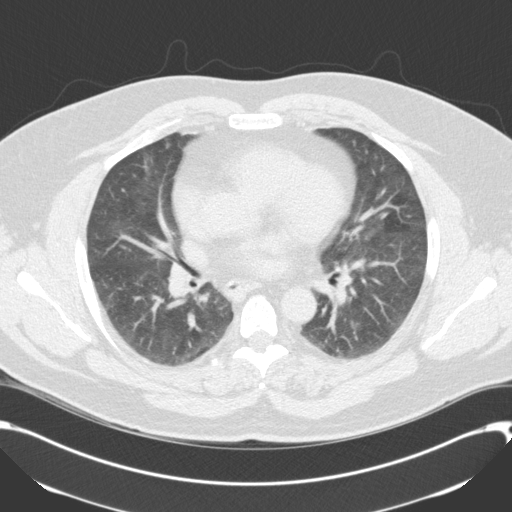
[im 79/87  vessel]
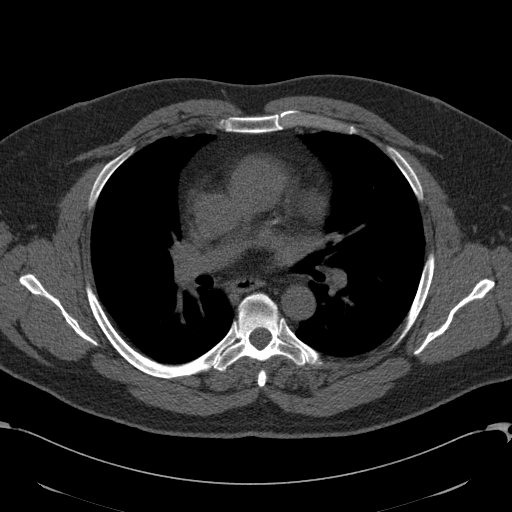

[10 of 20 positions shown; findings below may reference images not displayed]

FINDINGS: Mediastinum/Nodes: No imaged thoracic adenopathy. Normal descending
thoracic aorta.

Lungs/Pleura: No imaged pleural fluid. A right lower lobe subpleural
pulmonary nodule measures 6 mm on image 7 of series 4. This area was
mildly motion degraded on the prior exam. Nodule not definitely
identified.

A 3 mm subpleural right lower lobe pulmonary nodule more medially on
image 80 of series 4 is also not readily apparent on the prior exam.

Upper abdomen: Mild hepatic steatosis. Normal imaged portions of the
stomach.

Musculoskeletal: No acute osseous abnormality.
IMPRESSION: 1. Right lower lobe pulmonary nodules up to 6 mm. Not readily
apparent on the prior exam; this was motion degraded. If the patient
is at high risk for bronchogenic carcinoma, follow-up chest CT at
6-12 months is recommended. If the patient is at low risk for
bronchogenic carcinoma, follow-up chest CT at 12 months is
recommended. This recommendation follows the consensus statement:
"Guidelines for Management of Small Pulmonary Nodules Detected on CT
Scans: A Statement from the [HOSPITAL]" as published in
[K1];[DATE]. Online at:
[URL]
2. Mild hepatic steatosis.
These results will be called to the ordering clinician or
representative by the Radiologist Assistant, and communication
documented in the PACS or zVision Dashboard.
FINDINGS: Non-cardiac: See separate report from [REDACTED].

Ascending Aorta:  Normal caliber.  No calcification.

Pericardium: Normal.

Coronary arteries:  Normal origin.
IMPRESSION: Coronary calcium score of 0.7. This was 56 percentile for age and
sex matched control. There is a trivial focal calcification in the
LAD, given patient's young age the percentile appear high, however
is insignificant.

LUCKY

## 2015-07-07 NOTE — Telephone Encounter (Signed)
Informed patient of results and verbal understanding expressed.   FLP scheduled for Friday, 9/9. Patient agrees with treatment plan.

## 2015-07-07 NOTE — Patient Instructions (Signed)
Medication Instructions:  Your physician recommends that you continue on your current medications as directed. Please refer to the Current Medication list given to you today.   Labwork: None  Testing/Procedures: Your physician has requested that you have an exercise tolerance test. For further information please visit [email or web address] Please also follow instruction sheet, as given.  Dr. Mayford Knife recommends you have a Coronary Calcium Score.  Your physician has recommended that you have a sleep study. This test records several body functions during sleep, including: brain activity, eye movement, oxygen and carbon dioxide blood levels, heart rate and rhythm, breathing rate and rhythm, the flow of air through your mouth and nose, snoring, body muscle movements, and chest and belly movement.  Follow-Up: Your physician wants you to follow-up in: 1 year with Dr. Mayford Knife. You will receive a reminder letter in the mail two months in advance. If you don't receive a letter, please call our office to schedule the follow-up appointment.   Any Other Special Instructions Will Be Listed Below (If Applicable).

## 2015-07-07 NOTE — Progress Notes (Signed)
Cardiology Office Note   Date:  07/07/2015   ID:  [name], DOB 06-08-1966, MRN [id number]  PCP:  Johny Blamer, MD    Chief Complaint  Patient presents with  . New Evaluation    atypical chest pain      History of Present Illness: Brandon Villanueva. is a 49 y.o. male who presents for evaluation of chest pain.  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  When he went to work that am he felt a little chest discomfort associated with diaphoresis, nausea and SOB.  He says that it felt like a pulled muscle over his left breast.  He continued to work and went to lunch but the discomfort continued and he was sent to the ER.  His workup was normal in the ER.  He was given NTG SL by EMS with no relief.  He continued to have the pain for a few days and then resolved.  Since then he has not had any further CP.  He denies any SOB or DOE except in extreme heat.  Occasionally he will feel his heart beat hard and get dizzy.  He denies any LE edema.      Past Medical History  Diagnosis Date  . Hypertension   . Arthritis     Past Surgical History  Procedure Laterality Date  . Ventral hernia repair      Repaired twice by Dr. Luretha Murphy  . Inguinal hernia repair Right 2010     Current Outpatient Prescriptions  Medication Sig Dispense Refill  . aspirin 81 MG tablet Take 81 mg by mouth daily.    . Glucosamine-Chondroitin (GLUCOSAMINE CHONDR COMPLEX PO) Take by mouth daily.    . perindopril (ACEON) 4 MG tablet Take 4 mg by mouth daily.    . traMADol (ULTRAM) 50 MG tablet Take 50 mg by mouth every 6 (six) hours as needed for moderate pain.     No current facility-administered medications for this visit.    Allergies:   Review of patient's allergies indicates no known allergies.    Social History:  The patient  reports that he has never smoked. He does not have any smokeless tobacco history on file. He reports that he does not drink alcohol or use illicit  drugs.   Family History:  The patient's family history includes Coronary artery disease in his paternal grandmother; Coronary artery disease (age of onset: 26) in his father; Diabetes in his paternal grandfather; Heart attack in his paternal grandmother; Hyperlipidemia in his father.    ROS:  Please see the history of present illness.   Otherwise, review of systems are positive for none.   All other systems are reviewed and negative.    PHYSICAL EXAM: VS:  BP 150/90 mmHg  Pulse 94  Ht  (1.702 m)  Wt 242 lb (109.77 kg)  BMI 37.89 kg/m2  SpO2 95% , BMI Body mass index is 37.89 kg/(m^2). GEN: Well nourished, well developed, in no acute distress HEENT: normal Neck: no JVD, carotid bruits, or masses Cardiac: RRR; no murmurs, rubs, or gallops,no edema  Respiratory:  clear to auscultation bilaterally, normal work of breathing GI: soft, nontender, nondistended, + BS MS: no deformity or atrophy Skin: warm and dry, no rash Neuro:  Strength and sensation are intact Psych: euthymic mood, full affect   EKG:  EKG is not ordered today.  Recent Labs: 04/19/2015: BUN 9; Creatinine, Ser 0.86; Hemoglobin 14.3; Platelets 242; Potassium 3.9; Sodium 139    Lipid Panel No results found for: CHOL, TRIG, HDL, CHOLHDL, VLDL, LDLCALC, LDLDIRECT    Wt Readings from Last 3 Encounters:  07/07/15 242 lb (109.77 kg)  06/24/14 239 lb 2 oz (108.466 kg)        ASSESSMENT AND PLAN:  1.  Atypical chest pain with risk factors including male sex, HTN, obesity, family histroy and age > 3.  EKG in May 2016 at PCP office is nonischemic.  I will get an ETT to rule out ischemia.  If normal I have encouraged him to get into an exercise program. I will also get a coronary calcium score to assess future risk. 2.  HTN - controlled 3.  Morbid obesity - I have encouraged him to get into an exercise program and diet.   4.  Snoring with excessive daytime sleepiness and morbid obesity.  I will get a split  night PSG.     Current medicines are reviewed at length with the patient today.  The patient does not have concerns regarding medicines.  The following changes have been made:  no change  Labs/ tests ordered today: See above Assessment and Plan No orders of the defined types were placed in this encounter.     Disposition:   FU with me in 1 year if stress test is ok Signed, Quintella Reichert, MD  07/07/2015 2:26 PM    [facility] [address], [place], [place]  [address] Phone: [phone]; Fax: [phone]

## 2015-07-07 NOTE — Telephone Encounter (Signed)
-----   Message from Quintella Reichert, MD sent at 07/07/2015  4:38 PM EDT ----- There is trivial calcification in the LAD - please check FLP

## 2015-07-09 ENCOUNTER — Other Ambulatory Visit (INDEPENDENT_AMBULATORY_CARE_PROVIDER_SITE_OTHER): Payer: BLUE CROSS/BLUE SHIELD | Admitting: *Deleted

## 2015-07-09 ENCOUNTER — Telehealth: Payer: Self-pay | Admitting: Cardiology

## 2015-07-09 DIAGNOSIS — R079 Chest pain, unspecified: Secondary | ICD-10-CM

## 2015-07-09 LAB — LIPID PANEL
CHOLESTEROL: 176 mg/dL (ref 0–200)
HDL: 38.3 mg/dL — ABNORMAL LOW (ref >39.00)
LDL CALC: 119 mg/dL — AB (ref 0–99)
NonHDL: 137.75
TRIGLYCERIDES: 92 mg/dL (ref 0.0–149.0)
Total CHOL/HDL Ratio: 5
VLDL: 18.4 mg/dL (ref 0.0–40.0)

## 2015-07-09 NOTE — Telephone Encounter (Signed)
Please let patient know that chest CT showed 6mm RLL pulmonary nodule.  Please forward results to PCP to followup on

## 2015-07-12 ENCOUNTER — Telehealth: Payer: Self-pay

## 2015-07-12 DIAGNOSIS — E785 Hyperlipidemia, unspecified: Secondary | ICD-10-CM

## 2015-07-12 MED ORDER — ATORVASTATIN CALCIUM 10 MG PO TABS: 10.0000 mg | ORAL_TABLET | Freq: Every day | ORAL | Status: DC

## 2015-07-12 NOTE — Telephone Encounter (Signed)
-----   Message from Quintella Reichert, MD sent at 07/10/2015  2:01 PM EDT ----- Patient has some evidence of coronary calcium so LDL needs to be lower.  Please start Lipitor  daily and recheck FLP and ALT in 6 weeks

## 2015-07-12 NOTE — Telephone Encounter (Signed)
Informed patient of results and verbal understanding expressed.  Results forwarded to PCP.

## 2015-07-12 NOTE — Telephone Encounter (Signed)
Informed patient of results and verbal understanding expressed.  Instructed patient to START LIPITOR 10 mg daily.  FLP and ALT scheduled for 11/7. Patient agrees with treatment plan.

## 2015-07-28 ENCOUNTER — Encounter: Payer: BLUE CROSS/BLUE SHIELD | Admitting: Nurse Practitioner

## 2015-07-28 ENCOUNTER — Other Ambulatory Visit: Payer: Self-pay | Admitting: Nurse Practitioner

## 2015-07-28 ENCOUNTER — Ambulatory Visit (INDEPENDENT_AMBULATORY_CARE_PROVIDER_SITE_OTHER): Payer: BLUE CROSS/BLUE SHIELD

## 2015-07-28 DIAGNOSIS — R079 Chest pain, unspecified: Secondary | ICD-10-CM

## 2015-07-28 DIAGNOSIS — R0789 Other chest pain: Secondary | ICD-10-CM

## 2015-07-28 DIAGNOSIS — R9439 Abnormal result of other cardiovascular function study: Secondary | ICD-10-CM

## 2015-07-28 LAB — EXERCISE TOLERANCE TEST
CHL CUP MPHR: 171 {beats}/min
CHL CUP RESTING HR STRESS: 68 {beats}/min
CHL RATE OF PERCEIVED EXERTION: 19
CSEPED: 9 min
Estimated workload: 10.9 METS
Exercise duration (sec): 31 s
Peak HR: 148 {beats}/min
Percent HR: 86 %

## 2015-07-29 ENCOUNTER — Telehealth (HOSPITAL_COMMUNITY): Payer: Self-pay | Admitting: *Deleted

## 2015-07-29 NOTE — Telephone Encounter (Signed)
Left message on voicemail in reference to upcoming appointment scheduled for 08/02/15. Phone number given for a call back so details instructions can be given. Mary J  Smith, RN 

## 2015-07-30 ENCOUNTER — Telehealth (HOSPITAL_COMMUNITY): Payer: Self-pay | Admitting: Radiology

## 2015-07-30 NOTE — Telephone Encounter (Signed)
Patient given detailed instructions per Myocardial Perfusion Study Information Sheet for test on 08/02/2015 at 7:45. Patient notified to arrive 15 minutes early and that it is imperative to arrive on time for appointment to keep from having the test rescheduled.  If you need to cancel or reschedule your appointment, please call the office within 24 hours of your appointment. Failure to do so may result in a cancellation of your appointment, and a $50 no show fee. Patient verbalized understanding. EHK

## 2015-08-02 ENCOUNTER — Ambulatory Visit (HOSPITAL_COMMUNITY): Payer: BLUE CROSS/BLUE SHIELD | Attending: Cardiology

## 2015-08-02 DIAGNOSIS — R9439 Abnormal result of other cardiovascular function study: Secondary | ICD-10-CM | POA: Diagnosis not present

## 2015-08-02 DIAGNOSIS — I10 Essential (primary) hypertension: Secondary | ICD-10-CM | POA: Insufficient documentation

## 2015-08-02 DIAGNOSIS — R0602 Shortness of breath: Secondary | ICD-10-CM | POA: Diagnosis not present

## 2015-08-02 DIAGNOSIS — R0789 Other chest pain: Secondary | ICD-10-CM | POA: Insufficient documentation

## 2015-08-02 LAB — MYOCARDIAL PERFUSION IMAGING
Estimated workload: 11.5 METS
Exercise duration (min): 10 min
Exercise duration (sec): 42 s
LV dias vol: 111 mL
LV sys vol: 54 mL
MPHR: 171 {beats}/min
Peak HR: 155 {beats}/min
Percent HR: 90 %
RATE: 0.39
Rest HR: 61 {beats}/min
SDS: 0
SRS: 1
SSS: 1
TID: 0.95

## 2015-08-02 IMAGING — NM NM MISC PROCEDURE
9 series · 54 of 54 positions shown · non-contrast
Comparison: none

[Series 1: wbr_s-card_st stress-sum-em · 6.4mm · 6.40mm/px · 6 of 18 frames shown]
[frame 2/18]
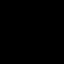
[frame 5/18]
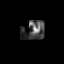
[frame 8/18]
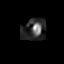
[frame 11/18]
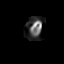
[frame 14/18]
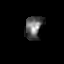
[frame 17/18]
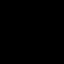

[Series 1: rest_(id)_sa · 6.4mm · 6.40mm/px · 6 of 64 frames shown]
[frame 6/64]
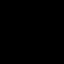
[frame 16/64]
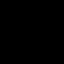
[frame 27/64]
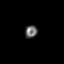
[frame 38/64]
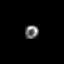
[frame 48/64]
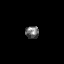
[frame 59/64]
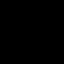

[Series 1: stress-sum-em · 6.40mm/px · 6 of 64 frames shown]
[frame 6/64]
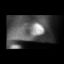
[frame 16/64]
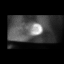
[frame 27/64]
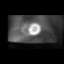
[frame 38/64]
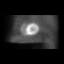
[frame 48/64]
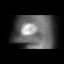
[frame 59/64]
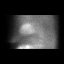

[Series 1: stress-sum-em_(id)_sa · 6.4mm · 6.40mm/px · 6 of 64 frames shown]
[frame 6/64]
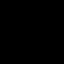
[frame 16/64]
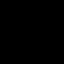
[frame 27/64]
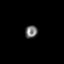
[frame 38/64]
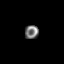
[frame 48/64]
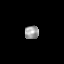
[frame 59/64]
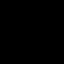

[Series 1: stress-gsp · 6.40mm/px · 6 of 510 frames shown]
[frame 43/510]
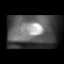
[frame 128/510]
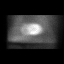
[frame 213/510]
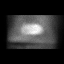
[frame 298/510]
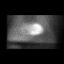
[frame 383/510]
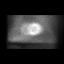
[frame 468/510]
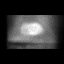

[Series 1: rest · 6.40mm/px · 6 of 64 frames shown]
[frame 6/64]
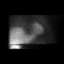
[frame 16/64]
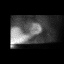
[frame 27/64]
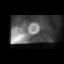
[frame 38/64]
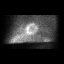
[frame 48/64]
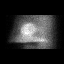
[frame 59/64]
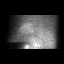

[Series 1: wbr_r-card_st rest · 6.4mm · 6.40mm/px · 6 of 18 frames shown]
[frame 2/18]
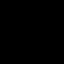
[frame 5/18]
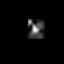
[frame 8/18]
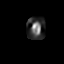
[frame 11/18]
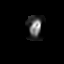
[frame 14/18]
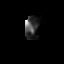
[frame 17/18]
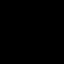

[Series 1: wbr_s-card_st stress-gsp · 6.4mm · 6.40mm/px · 6 of 142 frames shown]
[frame 12/142]
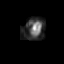
[frame 36/142]
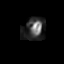
[frame 60/142]
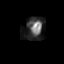
[frame 83/142]
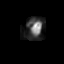
[frame 107/142]
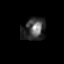
[frame 131/142]
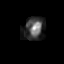

[Series 1: stress-gsp_(id)_sa · 6.4mm · 6.40mm/px · 6 of 512 frames shown]
[frame 43/512]
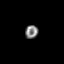
[frame 128/512]
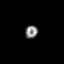
[frame 214/512]
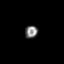
[frame 299/512]
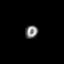
[frame 384/512]
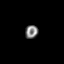
[frame 470/512]
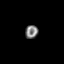

[54 of 54 positions shown; findings below may reference images not displayed]

Canned report from images found in remote index.

Refer to host system for actual result text.

## 2015-08-02 MED ORDER — TECHNETIUM TC 99M SESTAMIBI GENERIC - CARDIOLITE
10.5000 | Freq: Once | INTRAVENOUS | Status: AC | PRN
  Administered 2015-08-02: 11 via INTRAVENOUS

## 2015-08-02 MED ORDER — TECHNETIUM TC 99M SESTAMIBI GENERIC - CARDIOLITE
30.4000 | Freq: Once | INTRAVENOUS | Status: AC | PRN
  Administered 2015-08-02: 30 via INTRAVENOUS

## 2015-09-01 ENCOUNTER — Telehealth: Payer: Self-pay | Admitting: Cardiology

## 2015-09-01 NOTE — Telephone Encounter (Signed)
New Message    Pt calling stating that the last time he was here he was told he didn't need to come back for anything but he has a la appt for 09/06/15. Pt wants to know if he needs to come to this or can it be cancelled. Please call back.

## 2015-09-01 NOTE — Telephone Encounter (Signed)
Informed patient that his appointment 11/7 is to recheck his fasting lab work after changing medications a few weeks ago. Patient st Dr. Mayford Knifeurner told him during his stress test to stop taking the medication because he was having severe aching. Patient has not taken any since and refuses to come for lab work. Med list updated and appointment cancelled.

## 2015-09-06 ENCOUNTER — Other Ambulatory Visit: Payer: BLUE CROSS/BLUE SHIELD

## 2015-09-27 ENCOUNTER — Ambulatory Visit (HOSPITAL_BASED_OUTPATIENT_CLINIC_OR_DEPARTMENT_OTHER): Payer: BLUE CROSS/BLUE SHIELD | Attending: Cardiology

## 2015-09-27 VITALS — Ht 68.0 in | Wt 235.0 lb

## 2015-09-27 DIAGNOSIS — Z6837 Body mass index (BMI) 37.0-37.9, adult: Secondary | ICD-10-CM | POA: Insufficient documentation

## 2015-09-27 DIAGNOSIS — G4733 Obstructive sleep apnea (adult) (pediatric): Secondary | ICD-10-CM

## 2015-09-27 DIAGNOSIS — R0683 Snoring: Secondary | ICD-10-CM | POA: Diagnosis not present

## 2015-09-27 DIAGNOSIS — E669 Obesity, unspecified: Secondary | ICD-10-CM | POA: Insufficient documentation

## 2015-09-27 DIAGNOSIS — G4719 Other hypersomnia: Secondary | ICD-10-CM | POA: Diagnosis present

## 2015-09-28 ENCOUNTER — Encounter (HOSPITAL_BASED_OUTPATIENT_CLINIC_OR_DEPARTMENT_OTHER): Payer: Self-pay

## 2015-09-28 ENCOUNTER — Telehealth: Payer: Self-pay | Admitting: Cardiology

## 2015-09-28 DIAGNOSIS — G4733 Obstructive sleep apnea (adult) (pediatric): Secondary | ICD-10-CM | POA: Insufficient documentation

## 2015-09-28 HISTORY — DX: Obstructive sleep apnea (adult) (pediatric): G47.33

## 2015-09-28 NOTE — Addendum Note (Signed)
Addended by: Quintella ReichertURNER, Ustin Cruickshank R on: 09/28/2015 09:54 PM   Modules accepted: Orders

## 2015-09-28 NOTE — Sleep Study (Signed)
Patient Name: Brandon Villanueva, Brandon Villanueva MRN: [id number] Study Date: 09/27/2015 Gender: Male D.O.B: [birth year] Age (years): 70 Referring Provider: Fransico Him MD, ABSM Interpreting Physician: Fransico Him MD, ABSM RPSGT: Madelon Lips  Weight (lbs): 242 Height (inches): 68 BMI: 37 Neck Size: 18.50  CLINICAL INFORMATION Sleep Study Type: Split Night CPAP Indication for sleep study: Excessive Daytime Sleepiness, Obesity, Snoring Epworth Sleepiness Score: 17  SLEEP STUDY TECHNIQUE As per the AASM Manual for the Scoring of Sleep and Associated Events v2.3 (April 2016) with a hypopnea requiring 4% desaturations. The channels recorded and monitored were frontal, central and occipital EEG, electrooculogram (EOG), submentalis EMG (chin), nasal and oral airflow, thoracic and abdominal wall motion, anterior tibialis EMG, snore microphone, electrocardiogram, and pulse oximetry. Continuous positive airway pressure (CPAP) was initiated when the patient met split night criteria and was titrated according to treat sleep-disordered breathing.  MEDICATIONS Medications taken by the patient : Perindopril, ASA, Tramadol. Medications administered by patient during sleep study : No sleep medicine administered.  RESPIRATORY PARAMETERS Diagnostic Total AHI (/hr): 47.6  RDI (/hr):48.6   OA Index (/hr): 1.7  CA Index (/hr): 4.2 REM AHI (/hr): 105.9  NREM AHI (/hr):44.5  Supine AHI (/hr):104.0  Non-supine AHI (/hr):39.11 Min O2 Sat (%):78.00  Mean O2 (%): 89.67  Time below 88% (min):38.7    Titration Optimal Pressure (cm):11 AHI at Optimal Pressure (/hr):0.0 Min O2 at Optimal Pressure (%):91.0 Supine % at Optimal (%):0 Sleep % at Optimal (%):100    SLEEP ARCHITECTURE The recording time for the entire night was 427.3 minutes. During a baseline period of 200.4 minutes, the patient slept for 172.8 minutes in REM and nonREM, yielding a sleep efficiency of 86.3%. Sleep onset after lights out was 12.5 minutes with a  REM latency of 105.0 minutes. The patient spent 14.17% of the night in stage N1 sleep, 77.15% in stage N2 sleep, 3.76% in stage N3 and 4.92% in REM.   During the titration period of 221.6 minutes, the patient slept for 172.7 minutes in REM and nonREM, yielding a sleep efficiency of 77.9%. Sleep onset after CPAP initiation was 7.9 minutes with a REM latency of 68.0 minutes. The patient spent 10.71% of the night in stage N1 sleep, 64.39% in stage N2 sleep, 0.00% in stage N3 and 24.90% in REM.  CARDIAC DATA The 2 lead EKG demonstrated sinus rhythm. The mean heart rate was 70.25 beats per minute. Other EKG findings include: None.  LEG MOVEMENT DATA The total Periodic Limb Movements of Sleep (PLMS) were 3. The PLMS index was 0.52 .  IMPRESSIONS - Severe obstructive sleep apnea occurred during the diagnostic portion of the study (AHI = 47.6/hour). An optimal PAP pressure was selected for this patient ( 11 cm of water) - No significant central sleep apnea occurred during the diagnostic portion of the study (CAI = 4.2/hour). - Mild oxygen desaturation was noted during the diagnostic portion of the study (Min O2 = 78.00%). - The patient snored with Loud snoring volume during the diagnostic portion of the study. - No cardiac abnormalities were noted during this study. - Clinically significant periodic limb movements did not occur during sleep.  DIAGNOSIS - Obstructive Sleep Apnea (327.23 [G47.33 ICD-10])  RECOMMENDATIONS - Trial of CPAP therapy on 11 cm H2O with a Medium size Fisher&Paykel Full Face Mask Simplus mask and heated humidification. - Avoid alcohol, sedatives and other CNS depressants that may worsen sleep apnea and disrupt normal sleep architecture. - Sleep hygiene should be reviewed to assess factors that may  improve sleep quality. - Weight management and regular exercise should be initiated or continued. - Return to Sleep Center for re-evaluation after 10 weeks of  therapy  [place], American Board of Sleep Medicine  ELECTRONICALLY SIGNED ON:  09/28/2015, 9:46 PM [place] PH: [phone]   FX: [phone] [place]

## 2015-09-28 NOTE — Telephone Encounter (Signed)
Please let patient know that they have significant sleep apnea and had successful CPAP titration and will be set up with CPAP unit.  Please let DME know that order is in EPIC.  Please set patient up for OV in 10 weeks 

## 2015-10-01 NOTE — Telephone Encounter (Signed)
Left message for patient to call back  

## 2015-10-01 NOTE — Telephone Encounter (Signed)
Patient informed of results.  Stated verbal understanding.   He will be set up with [facility] in 2-3 weeks. Once he is set up I will schedule 10 week follow-up.

## 2015-11-23 ENCOUNTER — Telehealth: Payer: Self-pay | Admitting: *Deleted

## 2015-11-23 NOTE — Telephone Encounter (Signed)
Left message for patient to call to schedule follow-up with Dr. Mayford Knife.  If he calls when I am not available, he needs to be scheduled EARLY April for 10 week follow-up with CPAP.

## 2015-12-28 ENCOUNTER — Encounter: Payer: Self-pay | Admitting: Cardiology

## 2016-02-02 ENCOUNTER — Ambulatory Visit (INDEPENDENT_AMBULATORY_CARE_PROVIDER_SITE_OTHER): Payer: BLUE CROSS/BLUE SHIELD | Admitting: Cardiology

## 2016-02-02 ENCOUNTER — Encounter: Payer: Self-pay | Admitting: Cardiology

## 2016-02-02 VITALS — BP 134/74 | HR 68 | Ht 68.0 in | Wt 237.0 lb

## 2016-02-02 DIAGNOSIS — R079 Chest pain, unspecified: Secondary | ICD-10-CM

## 2016-02-02 DIAGNOSIS — I2510 Atherosclerotic heart disease of native coronary artery without angina pectoris: Secondary | ICD-10-CM

## 2016-02-02 DIAGNOSIS — E785 Hyperlipidemia, unspecified: Secondary | ICD-10-CM

## 2016-02-02 DIAGNOSIS — G4733 Obstructive sleep apnea (adult) (pediatric): Secondary | ICD-10-CM | POA: Diagnosis not present

## 2016-02-02 DIAGNOSIS — I10 Essential (primary) hypertension: Secondary | ICD-10-CM | POA: Insufficient documentation

## 2016-02-02 HISTORY — DX: Atherosclerotic heart disease of native coronary artery without angina pectoris: I25.10

## 2016-02-02 HISTORY — DX: Essential (primary) hypertension: I10

## 2016-02-02 HISTORY — DX: Hyperlipidemia, unspecified: E78.5

## 2016-02-02 MED ORDER — PRAVASTATIN SODIUM 20 MG PO TABS: 20.0000 mg | ORAL_TABLET | Freq: Every evening | ORAL | Status: DC

## 2016-02-02 NOTE — Patient Instructions (Addendum)
Medication Instructions:  Your physician has recommended you make the following change in your medication:  1) START PRAVACHOL 20 mg daily  Labwork: Your physician recommends that you return for FASTING lab work in 6-8 weeks.   Testing/Procedures: None  Follow-Up: Your physician wants you to follow-up in: 1 year with Dr. Mayford Knifeurner. You will receive a reminder letter in the mail two months in advance. If you don't receive a letter, please call our office to schedule the follow-up appointment.   Any Other Special Instructions Will Be Listed Below (If Applicable).     If you need a refill on your cardiac medications before your next appointment, please call your pharmacy.

## 2016-02-02 NOTE — Progress Notes (Signed)
Cardiology Office Note    Date:  02/02/2016   ID:  Brandon Pu., DOB [birth year], MRN [id number]  PCP:  Brandon Blamer, MD  Cardiologist:  Quintella Reichert, MD   Chief Complaint  Patient presents with  . Coronary Artery Disease  . Hypertension  . Sleep Apnea    History of Present Illness:  Brandon Villanueva. is a 50 y.o. male who presents for followup  of atypical chest pain. Since I saw him last he has not had any further CP.  His ETT was normal with no ischemia last year.  His coronary calcium score was 0.7 with trivial calcification in the LAD. He is doing well and denies any chest pain.   He denies any SOB or DOE, palpitations, dizziness or syncope.  He was tried on Lipitor due to elevated LDL but he did not tolerate it due to muscle aches.  He denies any LE edema.he also has a history of OSA and is doing well with his CPAP device.  He tolerates the nasal pillow mask and feels the pressure is adequate.  He has no daytime sleepiness.      Past Medical History  Diagnosis Date  . Hypertension   . Arthritis   . Excessive daytime sleepiness 07/07/2015  . OSA (obstructive sleep apnea) 09/28/2015    Severe with AHI 47/hr with successful CPAP titration to 11cm H2O  . Coronary artery calcification seen on CAT scan 02/02/2016  . Hyperlipidemia with target LDL less than 70 02/02/2016    Past Surgical History  Procedure Laterality Date  . Ventral hernia repair      Repaired twice by Dr. Luretha Murphy  . Inguinal hernia repair Right 2010    Current Medications: Outpatient Prescriptions Prior to Visit  Medication Sig Dispense Refill  . aspirin 81 MG tablet Take 81 mg by mouth daily.    . Glucosamine-Chondroitin (GLUCOSAMINE CHONDR COMPLEX PO) Take by mouth daily.    . perindopril (ACEON) 4 MG tablet Take 4 mg by mouth daily.    . traMADol (ULTRAM) 50 MG tablet Take 50 mg by mouth every 6 (six) hours as needed for moderate pain. Reported on 02/02/2016     No facility-administered  medications prior to visit.     Allergies:   Lipitor   Social History   Social History  . Marital Status: Married    Spouse Name: N/A  . Number of Children: N/A  . Years of Education: N/A   Social History Main Topics  . Smoking status: Never Smoker   . Smokeless tobacco: None  . Alcohol Use: No  . Drug Use: No  . Sexual Activity: Not Asked   Other Topics Concern  . None   Social History Narrative     Family History:  The patient's family history includes Coronary artery disease in his paternal grandmother; Coronary artery disease (age of onset: 65) in his father; Diabetes in his paternal grandfather; Heart attack in his paternal grandmother; Hyperlipidemia in his father.   ROS:   Please see the history of present illness.    ROS All other systems reviewed and are negative.   PHYSICAL EXAM:   VS:  BP 134/74 mmHg  Pulse 68  Ht  (1.727 m)  Wt 237 lb (107.502 kg)  BMI 36.04 kg/m2   GEN: Well nourished, well developed, in no acute distress HEENT: normal Neck: no JVD, carotid bruits, or masses Cardiac: RRR; no murmurs, rubs, or gallops,no edema.  Intact distal  pulses bilaterally.  Respiratory:  clear to auscultation bilaterally, normal work of breathing GI: soft, nontender, nondistended, + BS MS: no deformity or atrophy Skin: warm and dry, no rash Neuro:  Alert and Oriented x 3, Strength and sensation are intact Psych: euthymic mood, full affect  Wt Readings from Last 3 Encounters:  02/02/16 237 lb (107.502 kg)  09/27/15 235 lb (106.595 kg)  08/02/15 242 lb (109.77 kg)      Studies/Labs Reviewed:   EKG:  EKG was not ordered today.    Recent Labs: 04/19/2015: BUN 9; Creatinine, Ser 0.86; Hemoglobin 14.3; Platelets 242; Potassium 3.9; Sodium 139   Lipid Panel    Component Value Date/Time   CHOL 176 07/09/2015 0734   TRIG 92.0 07/09/2015 0734   HDL 38.30* 07/09/2015 0734   CHOLHDL 5 07/09/2015 0734   VLDL 18.4 07/09/2015 0734   LDLCALC 119*  07/09/2015 0734    Additional studies/ records that were reviewed today include:  none    ASSESSMENT:    1. Chest pain, unspecified chest pain type   2. Morbid obesity, unspecified obesity type (HCC)   3. Coronary artery calcification seen on CAT scan   4. OSA (obstructive sleep apnea)   5. Benign essential HTN   6. Hyperlipidemia with target LDL less than 70      PLAN:  In order of problems listed above:  1. Atypical chest pain with no ischemia on ETT and no futher CP.   2. Obesity - I have encouraged him to get into a routine exercise program.  I have encouraged him to walk 30-40 minutes daily.  We discussed limiting carbs.   3. ASCAD with minimal calcification noted on coronary CT for calcium score.  Continue ASA.  He was intolerant to lipitor.  Will try another statin.   4. OSA on CPAP and tolerating well.  His d/l today showed an AHI of 0.6/hr on 11cm H2O with 97% compliance in using more than 4 hours nightly.   5. HTN- controlled on current medical regimen.  Continue Aceon.   6. Hyperlipidemia - his goal LDL is < 70 and last LDL was 119.  He is intolerant to Lipitor.  I will start Pravachol 20mg  daily and recheck FLp and ALT in 6 weeks.    Followup with me in 1 year    Medication Adjustments/Labs and Tests Ordered: Current medicines are reviewed at length with the patient today.  Concerns regarding medicines are outlined above.  Medication changes, Labs and Tests ordered today are listed in the Patient Instructions below. There are no Patient Instructions on file for this visit.   Brandon FlorSigned, Carvell Hoeffner R, MD  02/02/2016 8:53 AM    [facility] [address], [place], [place]  [address] Phone: [phone]; Fax: [phone]

## 2016-02-09 DIAGNOSIS — M79671 Pain in right foot: Secondary | ICD-10-CM | POA: Diagnosis not present

## 2016-02-09 DIAGNOSIS — M722 Plantar fascial fibromatosis: Secondary | ICD-10-CM | POA: Diagnosis not present

## 2016-02-22 DIAGNOSIS — R1030 Lower abdominal pain, unspecified: Secondary | ICD-10-CM | POA: Diagnosis not present

## 2016-03-25 DIAGNOSIS — T7840XA Allergy, unspecified, initial encounter: Secondary | ICD-10-CM | POA: Diagnosis not present

## 2016-03-25 DIAGNOSIS — H02846 Edema of left eye, unspecified eyelid: Secondary | ICD-10-CM | POA: Diagnosis not present

## 2016-03-29 ENCOUNTER — Other Ambulatory Visit (INDEPENDENT_AMBULATORY_CARE_PROVIDER_SITE_OTHER): Payer: BLUE CROSS/BLUE SHIELD | Admitting: *Deleted

## 2016-03-29 DIAGNOSIS — E785 Hyperlipidemia, unspecified: Secondary | ICD-10-CM

## 2016-03-29 LAB — LIPID PANEL
CHOLESTEROL: 122 mg/dL — AB (ref 125–200)
HDL: 38 mg/dL — AB (ref >=40)
LDL Cholesterol: 66 mg/dL (ref <130)
TRIGLYCERIDES: 90 mg/dL (ref <150)
Total CHOL/HDL Ratio: 3.2 Ratio (ref <=5.0)
VLDL: 18 mg/dL (ref <30)

## 2016-03-29 LAB — HEPATIC FUNCTION PANEL
ALBUMIN: 3.9 g/dL (ref 3.6–5.1)
ALT: 37 U/L (ref 9–46)
AST: 20 U/L (ref 10–35)
Alkaline Phosphatase: 93 U/L (ref 40–115)
Bilirubin, Direct: 0.1 mg/dL (ref <=0.2)
Indirect Bilirubin: 0.3 mg/dL (ref 0.2–1.2)
TOTAL PROTEIN: 6 g/dL — AB (ref 6.1–8.1)
Total Bilirubin: 0.4 mg/dL (ref 0.2–1.2)

## 2016-03-29 NOTE — Addendum Note (Signed)
Addended by: Tonita PhoenixBOWDEN, ROBIN K on: 03/29/2016 07:37 AM   Modules accepted: Orders

## 2016-03-31 ENCOUNTER — Telehealth: Payer: Self-pay | Admitting: Cardiology

## 2016-03-31 NOTE — Telephone Encounter (Signed)
NEW MESSAGE   PT IS RETURNING CALL FOR KATY HE SAID SHE HAS HIS LAB RESULTS

## 2016-03-31 NOTE — Telephone Encounter (Signed)
Informed patient of results and verbal understanding expressed.  

## 2016-03-31 NOTE — Telephone Encounter (Signed)
-----   Message from Quintella Reichertraci R Turner, MD sent at 03/30/2016  7:56 PM EDT ----- Please let patient know that labs were normal.  Continue current medical therapy.

## 2016-05-19 DIAGNOSIS — G4733 Obstructive sleep apnea (adult) (pediatric): Secondary | ICD-10-CM | POA: Diagnosis not present

## 2016-08-22 DIAGNOSIS — G4733 Obstructive sleep apnea (adult) (pediatric): Secondary | ICD-10-CM | POA: Diagnosis not present

## 2016-08-22 DIAGNOSIS — Z125 Encounter for screening for malignant neoplasm of prostate: Secondary | ICD-10-CM | POA: Diagnosis not present

## 2016-08-22 DIAGNOSIS — I10 Essential (primary) hypertension: Secondary | ICD-10-CM | POA: Diagnosis not present

## 2016-08-22 DIAGNOSIS — E7800 Pure hypercholesterolemia, unspecified: Secondary | ICD-10-CM | POA: Diagnosis not present

## 2016-09-07 DIAGNOSIS — G4733 Obstructive sleep apnea (adult) (pediatric): Secondary | ICD-10-CM | POA: Diagnosis not present

## 2016-10-13 DIAGNOSIS — Z1211 Encounter for screening for malignant neoplasm of colon: Secondary | ICD-10-CM | POA: Diagnosis not present

## 2016-10-13 DIAGNOSIS — K621 Rectal polyp: Secondary | ICD-10-CM | POA: Diagnosis not present

## 2016-12-08 DIAGNOSIS — J329 Chronic sinusitis, unspecified: Secondary | ICD-10-CM | POA: Diagnosis not present

## 2017-01-10 DIAGNOSIS — G4733 Obstructive sleep apnea (adult) (pediatric): Secondary | ICD-10-CM | POA: Diagnosis not present

## 2017-01-22 DIAGNOSIS — R5383 Other fatigue: Secondary | ICD-10-CM | POA: Diagnosis not present

## 2017-01-22 DIAGNOSIS — M5416 Radiculopathy, lumbar region: Secondary | ICD-10-CM | POA: Diagnosis not present

## 2017-01-22 DIAGNOSIS — M2550 Pain in unspecified joint: Secondary | ICD-10-CM | POA: Diagnosis not present

## 2017-02-01 ENCOUNTER — Encounter: Payer: Self-pay | Admitting: Cardiology

## 2017-02-05 DIAGNOSIS — R748 Abnormal levels of other serum enzymes: Secondary | ICD-10-CM | POA: Diagnosis not present

## 2017-02-13 DIAGNOSIS — W540XXA Bitten by dog, initial encounter: Secondary | ICD-10-CM | POA: Diagnosis not present

## 2017-02-13 DIAGNOSIS — S80271A Other superficial bite of right knee, initial encounter: Secondary | ICD-10-CM | POA: Diagnosis not present

## 2017-02-15 ENCOUNTER — Encounter: Payer: Self-pay | Admitting: Cardiology

## 2017-02-16 DIAGNOSIS — M79671 Pain in right foot: Secondary | ICD-10-CM | POA: Diagnosis not present

## 2017-02-16 DIAGNOSIS — G8929 Other chronic pain: Secondary | ICD-10-CM | POA: Diagnosis not present

## 2017-02-19 ENCOUNTER — Encounter: Payer: Self-pay | Admitting: Cardiology

## 2017-02-19 ENCOUNTER — Ambulatory Visit (INDEPENDENT_AMBULATORY_CARE_PROVIDER_SITE_OTHER): Payer: BLUE CROSS/BLUE SHIELD | Admitting: Cardiology

## 2017-02-19 ENCOUNTER — Ambulatory Visit: Payer: BLUE CROSS/BLUE SHIELD | Admitting: Cardiology

## 2017-02-19 VITALS — BP 126/82 | HR 78 | Ht 67.5 in | Wt 211.8 lb

## 2017-02-19 DIAGNOSIS — I2510 Atherosclerotic heart disease of native coronary artery without angina pectoris: Secondary | ICD-10-CM | POA: Diagnosis not present

## 2017-02-19 DIAGNOSIS — E785 Hyperlipidemia, unspecified: Secondary | ICD-10-CM | POA: Diagnosis not present

## 2017-02-19 DIAGNOSIS — G4733 Obstructive sleep apnea (adult) (pediatric): Secondary | ICD-10-CM

## 2017-02-19 DIAGNOSIS — I10 Essential (primary) hypertension: Secondary | ICD-10-CM | POA: Diagnosis not present

## 2017-02-19 MED ORDER — PRAVASTATIN SODIUM 20 MG PO TABS: 20.0000 mg | ORAL_TABLET | Freq: Every evening | ORAL | 11 refills | Status: DC

## 2017-02-19 NOTE — Progress Notes (Signed)
Cardiology Office Note    Date:  02/20/2017   ID:  Brandon Pu., DOB August 06, 1966, MRN [id number]  PCP:  Johny Blamer, MD  Cardiologist:  Armanda Magic, MD   Chief Complaint  Patient presents with  . Sleep Apnea  . Hypertension    History of Present Illness:  Brandon Villanueva. is a 51 y.o. male  who presents for followup  of atypical chest pain. Since I saw him last he has not had any further CP.  His ETT was normal with no ischemia last year.  His coronary calcium score was 0.7 with trivial calcification in the LAD. He is doing well and denies any chest pain.   He denies any SOB or DOE, palpitations, dizziness or syncope.  He was tried on Lipitor due to elevated LDL but he did not tolerate it due to muscle aches.  He is now on Pravachol and tolerating well . He denies any LE edema.he also has a history of OSA and is doing well with his CPAP device.  He tolerates the nasal pillow mask and feels the pressure is adequate.  He has no daytime sleepiness.    Past Medical History:  Diagnosis Date  . Arthritis   . Coronary artery calcification seen on CAT scan 02/02/2016  . Excessive daytime sleepiness 07/07/2015  . Hyperlipidemia with target LDL less than 70 02/02/2016  . Hypertension   . OSA (obstructive sleep apnea) 09/28/2015   Severe with AHI 47/hr with successful CPAP titration to 11cm H2O    Past Surgical History:  Procedure Laterality Date  . INGUINAL HERNIA REPAIR Right 2010  . VENTRAL HERNIA REPAIR     Repaired twice by Dr. Luretha Murphy    Current Medications: Current Meds  Medication Sig  . aspirin 81 MG tablet Take 81 mg by mouth daily.  . Glucosamine-Chondroitin (GLUCOSAMINE CHONDR COMPLEX PO) Take by mouth daily.  . perindopril (ACEON) 4 MG tablet Take 4 mg by mouth daily.  . pravastatin (PRAVACHOL) 20 MG tablet Take 1 tablet (20 mg total) by mouth every evening.  . traMADol (ULTRAM) 50 MG tablet Take 50 mg by mouth every 6 (six) hours as needed for moderate  pain. Reported on 02/02/2016  . [DISCONTINUED] pravastatin (PRAVACHOL) 20 MG tablet Take 1 tablet (20 mg total) by mouth every evening.    Allergies:   Lipitor [atorvastatin]   Social History   Social History  . Marital status: Married    Spouse name: N/A  . Number of children: N/A  . Years of education: N/A   Social History Main Topics  . Smoking status: Never Smoker  . Smokeless tobacco: Never Used  . Alcohol use No  . Drug use: No  . Sexual activity: Not Asked   Other Topics Concern  . None   Social History Narrative  . None     Family History:  The patient's family history includes Coronary artery disease in his paternal grandmother; Coronary artery disease (age of onset: 18) in his father; Diabetes in his paternal grandfather; Heart attack in his paternal grandmother; Hyperlipidemia in his father.   ROS:   Please see the history of present illness.    ROS All other systems reviewed and are negative.  No flowsheet data found.     PHYSICAL EXAM:   VS:  BP 126/82   Pulse 78   Ht 5' 7.5" (1.715 m)   Wt 211 lb 12.8 oz (96.1 kg)   SpO2 96%  BMI 32.68 kg/m    GEN: Well nourished, well developed, in no acute distress  HEENT: normal  Neck: no JVD, carotid bruits, or masses Cardiac: RRR; no murmurs, rubs, or gallops,no edema.  Intact distal pulses bilaterally.  Respiratory:  clear to auscultation bilaterally, normal work of breathing GI: soft, nontender, nondistended, + BS MS: no deformity or atrophy  Skin: warm and dry, no rash Neuro:  Alert and Oriented x 3, Strength and sensation are intact Psych: euthymic mood, full affect  Wt Readings from Last 3 Encounters:  02/19/17 211 lb 12.8 oz (96.1 kg)  02/02/16 237 lb (107.5 kg)  09/27/15 235 lb (106.6 kg)      Studies/Labs Reviewed:   EKG:  EKG is not ordered today.    Recent Labs: 03/29/2016: ALT 37   Lipid Panel    Component Value Date/Time   CHOL 122 (L) 03/29/2016 0750   TRIG 90 03/29/2016 0750    HDL 38 (L) 03/29/2016 0750   CHOLHDL 3.2 03/29/2016 0750   VLDL 18 03/29/2016 0750   LDLCALC 66 03/29/2016 0750    Additional studies/ records that were reviewed today include:  CPAP download    ASSESSMENT:    1. OSA (obstructive sleep apnea)   2. Coronary artery calcification seen on CAT scan   3. Benign essential HTN   4. Morbid obesity (HCC)   5. Hyperlipidemia with target LDL less than 70      PLAN:  In order of problems listed above:  OSA - the patient is tolerating PAP therapy well without any problems. The PAP download was reviewed today and showed an AHI of 1.2/hr on 11 cm H2O with 100% compliance in using more than 4 hours nightly.  The patient has been using and benefiting from CPAP use and will continue to benefit from therapy.  Coronary artery calcifications on CT scan with minimal calcium score.  He has not had any angina CP.  He will continue on ASA and statin.  HTN - BP adequately controlled today in the office. He will continue on ACE I.   Morbid obesity - I have encouraged him to get into a routine exercise program and cut back on carbs and portions.  5.   Hyperlipidemia with LDL goal < 70.  He will continue on pravachol and we will refill this for him today.     Medication Adjustments/Labs and Tests Ordered: Current medicines are reviewed at length with the patient today.  Concerns regarding medicines are outlined above.  Medication changes, Labs and Tests ordered today are listed in the Patient Instructions below.  Patient Instructions  Medication Instructions:  Your physician recommends that you continue on your current medications as directed. Please refer to the Current Medication list given to you today.   Labwork: 1 WEEK:  FASTING LIPID & LFT  Testing/Procedures: Your physician has requested that you have en exercise stress myoview IN October 2018.  For further information please visit [email or web address] Please follow instruction sheet, as  given.    Follow-Up: Your physician wants you to follow-up in: 1 YEAR F/U WITH DR. Sherlyn Lick will receive a reminder letter in the mail two months in advance. If you don't receive a letter, please call our office to schedule the follow-up appointment.   Any Other Special Instructions Will Be Listed Below (If Applicable).   Exercise Stress Electrocardiogram An exercise stress electrocardiogram is a test to check how blood flows to your heart. It is done to find areas of  poor blood flow. You will need to walk on a treadmill for this test. The electrocardiogram will record your heartbeat when you are at rest and when you are exercising. What happens before the procedure?  Do not have drinks with caffeine or foods with caffeine for 24 hours before the test, or as told by your doctor. This includes coffee, tea (even decaf tea), sodas, chocolate, and cocoa.  Follow your doctor's instructions about eating and drinking before the test.  Ask your doctor what medicines you should or should not take before the test. Take your medicines with water unless told by your doctor not to.  If you use an inhaler, bring it with you to the test.  Bring a snack to eat after the test.  Do not  smoke for 4 hours before the test.  Do not put lotions, powders, creams, or oils on your chest before the test.  Wear comfortable shoes and clothing. What happens during the procedure?  You will have patches put on your chest. Small areas of your chest may need to be shaved. Wires will be connected to the patches.  Your heart rate will be watched while you are resting and while you are exercising.  You will walk on the treadmill. The treadmill will slowly get faster to raise your heart rate.  The test will take about 1-2 hours. What happens after the procedure?  Your heart rate and blood pressure will be watched after the test.  You may return to your normal diet, activities, and medicines or as told by  your doctor. This information is not intended to replace advice given to you by your health care provider. Make sure you discuss any questions you have with your health care provider. Document Released: 04/03/2008 Document Revised: 06/14/2016 Document Reviewed: 06/23/2013 Elsevier Interactive Patient Education  2017 [employer].   If you need a refill on your cardiac medications before your next appointment, please call your pharmacy.      Signed, Armanda Magic, MD  02/20/2017 9:51 AM    [facility] [address] [place], [place], [place]  [address] Phone: [phone]; Fax: [phone]

## 2017-02-19 NOTE — Patient Instructions (Signed)
Medication Instructions:  Your physician recommends that you continue on your current medications as directed. Please refer to the Current Medication list given to you today.   Labwork: 1 WEEK:  FASTING LIPID & LFT  Testing/Procedures: Your physician has requested that you have en exercise stress myoview IN October 2018.  For further information please visit [email or web address] Please follow instruction sheet, as given.    Follow-Up: Your physician wants you to follow-up in: 1 YEAR F/U WITH DR. Sherlyn Lick will receive a reminder letter in the mail two months in advance. If you don't receive a letter, please call our office to schedule the follow-up appointment.   Any Other Special Instructions Will Be Listed Below (If Applicable).   Exercise Stress Electrocardiogram An exercise stress electrocardiogram is a test to check how blood flows to your heart. It is done to find areas of poor blood flow. You will need to walk on a treadmill for this test. The electrocardiogram will record your heartbeat when you are at rest and when you are exercising. What happens before the procedure?  Do not have drinks with caffeine or foods with caffeine for 24 hours before the test, or as told by your doctor. This includes coffee, tea (even decaf tea), sodas, chocolate, and cocoa.  Follow your doctor's instructions about eating and drinking before the test.  Ask your doctor what medicines you should or should not take before the test. Take your medicines with water unless told by your doctor not to.  If you use an inhaler, bring it with you to the test.  Bring a snack to eat after the test.  Do not  smoke for 4 hours before the test.  Do not put lotions, powders, creams, or oils on your chest before the test.  Wear comfortable shoes and clothing. What happens during the procedure?  You will have patches put on your chest. Small areas of your chest may need to be shaved. Wires will be connected  to the patches.  Your heart rate will be watched while you are resting and while you are exercising.  You will walk on the treadmill. The treadmill will slowly get faster to raise your heart rate.  The test will take about 1-2 hours. What happens after the procedure?  Your heart rate and blood pressure will be watched after the test.  You may return to your normal diet, activities, and medicines or as told by your doctor. This information is not intended to replace advice given to you by your health care provider. Make sure you discuss any questions you have with your health care provider. Document Released: 04/03/2008 Document Revised: 06/14/2016 Document Reviewed: 06/23/2013 Elsevier Interactive Patient Education  2017 [employer].   If you need a refill on your cardiac medications before your next appointment, please call your pharmacy.

## 2017-02-20 DIAGNOSIS — E7800 Pure hypercholesterolemia, unspecified: Secondary | ICD-10-CM | POA: Diagnosis not present

## 2017-02-20 DIAGNOSIS — Z0000 Encounter for general adult medical examination without abnormal findings: Secondary | ICD-10-CM | POA: Diagnosis not present

## 2017-02-20 DIAGNOSIS — I10 Essential (primary) hypertension: Secondary | ICD-10-CM | POA: Diagnosis not present

## 2017-02-20 DIAGNOSIS — G4733 Obstructive sleep apnea (adult) (pediatric): Secondary | ICD-10-CM | POA: Diagnosis not present

## 2017-03-01 ENCOUNTER — Other Ambulatory Visit: Payer: BLUE CROSS/BLUE SHIELD | Admitting: *Deleted

## 2017-03-01 DIAGNOSIS — I2510 Atherosclerotic heart disease of native coronary artery without angina pectoris: Secondary | ICD-10-CM | POA: Diagnosis not present

## 2017-03-01 DIAGNOSIS — G8929 Other chronic pain: Secondary | ICD-10-CM | POA: Diagnosis not present

## 2017-03-01 DIAGNOSIS — E785 Hyperlipidemia, unspecified: Secondary | ICD-10-CM | POA: Diagnosis not present

## 2017-03-01 DIAGNOSIS — I10 Essential (primary) hypertension: Secondary | ICD-10-CM

## 2017-03-01 DIAGNOSIS — M79671 Pain in right foot: Secondary | ICD-10-CM | POA: Diagnosis not present

## 2017-03-01 LAB — LIPID PANEL
CHOL/HDL RATIO: 2.5 ratio (ref 0.0–5.0)
Cholesterol, Total: 140 mg/dL (ref 100–199)
HDL: 56 mg/dL (ref >39)
LDL Calculated: 76 mg/dL (ref 0–99)
Triglycerides: 42 mg/dL (ref 0–149)
VLDL CHOLESTEROL CAL: 8 mg/dL (ref 5–40)

## 2017-03-01 LAB — HEPATIC FUNCTION PANEL
ALK PHOS: 88 IU/L (ref 39–117)
ALT: 25 IU/L (ref 0–44)
AST: 26 IU/L (ref 0–40)
Albumin: 4.5 g/dL (ref 3.5–5.5)
Bilirubin Total: 0.6 mg/dL (ref 0.0–1.2)
Bilirubin, Direct: 0.17 mg/dL (ref 0.00–0.40)
Total Protein: 7 g/dL (ref 6.0–8.5)

## 2017-07-31 DIAGNOSIS — G4733 Obstructive sleep apnea (adult) (pediatric): Secondary | ICD-10-CM | POA: Diagnosis not present

## 2017-08-07 ENCOUNTER — Telehealth (HOSPITAL_COMMUNITY): Payer: Self-pay | Admitting: *Deleted

## 2017-08-07 NOTE — Telephone Encounter (Signed)
Left message on voicemail in reference to upcoming appointment scheduled for 08/13/17. Phone number given for a call back so details instructions can be given.Brandon Villanueva

## 2017-08-08 ENCOUNTER — Telehealth (HOSPITAL_COMMUNITY): Payer: Self-pay | Admitting: Radiology

## 2017-08-08 NOTE — Telephone Encounter (Signed)
Patient given detailed instructions per Myocardial Perfusion Study Information Sheet for the test on 08/13/2017 at 7:30. Patient notified to arrive 15 minutes early and that it is imperative to arrive on time for appointment to keep from having the test rescheduled.  If you need to cancel or reschedule your appointment, please call the office within 24 hours of your appointment. . Patient verbalized understanding.EHK

## 2017-08-09 DIAGNOSIS — M545 Low back pain: Secondary | ICD-10-CM | POA: Diagnosis not present

## 2017-08-13 ENCOUNTER — Encounter (INDEPENDENT_AMBULATORY_CARE_PROVIDER_SITE_OTHER): Payer: Self-pay

## 2017-08-13 ENCOUNTER — Ambulatory Visit (HOSPITAL_COMMUNITY): Payer: BLUE CROSS/BLUE SHIELD | Attending: Cardiovascular Disease

## 2017-08-13 DIAGNOSIS — R079 Chest pain, unspecified: Secondary | ICD-10-CM | POA: Diagnosis not present

## 2017-08-13 DIAGNOSIS — E785 Hyperlipidemia, unspecified: Secondary | ICD-10-CM

## 2017-08-13 DIAGNOSIS — G4733 Obstructive sleep apnea (adult) (pediatric): Secondary | ICD-10-CM | POA: Diagnosis not present

## 2017-08-13 DIAGNOSIS — Z8249 Family history of ischemic heart disease and other diseases of the circulatory system: Secondary | ICD-10-CM | POA: Diagnosis not present

## 2017-08-13 DIAGNOSIS — I2510 Atherosclerotic heart disease of native coronary artery without angina pectoris: Secondary | ICD-10-CM | POA: Diagnosis not present

## 2017-08-13 DIAGNOSIS — I10 Essential (primary) hypertension: Secondary | ICD-10-CM | POA: Insufficient documentation

## 2017-08-13 LAB — MYOCARDIAL PERFUSION IMAGING
CHL CUP NUCLEAR SDS: 7
CHL CUP RESTING HR STRESS: 63 {beats}/min
CSEPED: 11 min
CSEPHR: 90 %
Estimated workload: 13.1 METS
Exercise duration (sec): 0 s
LVDIAVOL: 118 mL (ref 62–150)
LVSYSVOL: 60 mL
MPHR: 169 {beats}/min
Peak HR: 153 {beats}/min
RATE: 0.36
SRS: 2
SSS: 9
TID: 1.04

## 2017-08-13 MED ORDER — TECHNETIUM TC 99M TETROFOSMIN IV KIT
10.6000 | PACK | Freq: Once | INTRAVENOUS | Status: AC | PRN
  Administered 2017-08-13: 10.6 via INTRAVENOUS
  Filled 2017-08-13: qty 11

## 2017-08-13 MED ORDER — TECHNETIUM TC 99M TETROFOSMIN IV KIT
31.7000 | PACK | Freq: Once | INTRAVENOUS | Status: AC | PRN
  Administered 2017-08-13: 31.7 via INTRAVENOUS
  Filled 2017-08-13: qty 32

## 2017-08-17 ENCOUNTER — Telehealth: Payer: Self-pay | Admitting: Nurse Practitioner

## 2017-08-17 DIAGNOSIS — R0602 Shortness of breath: Secondary | ICD-10-CM

## 2017-08-17 NOTE — Telephone Encounter (Signed)
Reviewed myoview results with patient who verbalized understanding. He agrees with the plan to have an echocardiogram and scheduled for Wednesday, Oct. 24. He thanked me for the call.

## 2017-08-22 ENCOUNTER — Ambulatory Visit (HOSPITAL_COMMUNITY): Payer: BLUE CROSS/BLUE SHIELD | Attending: Cardiovascular Disease

## 2017-08-22 ENCOUNTER — Other Ambulatory Visit: Payer: Self-pay

## 2017-08-22 DIAGNOSIS — R0602 Shortness of breath: Secondary | ICD-10-CM | POA: Diagnosis not present

## 2017-08-22 DIAGNOSIS — I10 Essential (primary) hypertension: Secondary | ICD-10-CM | POA: Diagnosis not present

## 2017-08-22 DIAGNOSIS — G4733 Obstructive sleep apnea (adult) (pediatric): Secondary | ICD-10-CM | POA: Diagnosis not present

## 2017-08-22 DIAGNOSIS — E785 Hyperlipidemia, unspecified: Secondary | ICD-10-CM | POA: Diagnosis not present

## 2017-08-22 MED ORDER — PERFLUTREN LIPID MICROSPHERE
1.0000 mL | INTRAVENOUS | Status: AC | PRN
  Administered 2017-08-22: 2 mL via INTRAVENOUS

## 2017-09-18 DIAGNOSIS — B349 Viral infection, unspecified: Secondary | ICD-10-CM | POA: Diagnosis not present

## 2017-12-13 DIAGNOSIS — J0100 Acute maxillary sinusitis, unspecified: Secondary | ICD-10-CM | POA: Diagnosis not present

## 2017-12-24 DIAGNOSIS — R509 Fever, unspecified: Secondary | ICD-10-CM | POA: Diagnosis not present

## 2017-12-24 DIAGNOSIS — A084 Viral intestinal infection, unspecified: Secondary | ICD-10-CM | POA: Diagnosis not present

## 2017-12-24 DIAGNOSIS — R1084 Generalized abdominal pain: Secondary | ICD-10-CM | POA: Diagnosis not present

## 2018-01-02 ENCOUNTER — Other Ambulatory Visit: Payer: Self-pay | Admitting: Cardiology

## 2018-01-30 DIAGNOSIS — G4733 Obstructive sleep apnea (adult) (pediatric): Secondary | ICD-10-CM | POA: Diagnosis not present

## 2018-02-16 DIAGNOSIS — K529 Noninfective gastroenteritis and colitis, unspecified: Secondary | ICD-10-CM | POA: Diagnosis not present

## 2018-03-01 ENCOUNTER — Telehealth: Payer: Self-pay | Admitting: Cardiology

## 2018-03-01 NOTE — Telephone Encounter (Signed)
Patient c/o dizzy spells yesterday and early this morning. Patient stated he had another dizzy spell and left sided chest pressure while work at work today. 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 They performed an EKG that was normal. Patient stated they took his blood pressure but he does not remember the readings. He states he drinks a lot of fluid. Patient denies chest pain, palpitations or syncope. I scheduled patient with Boyce Medici, PA for 03/04/18 at 8AM. Patient in agreement with plan and thankful for the call. I informed patient if chest pain returns or symptoms get worse prior to OV he should go to ED to be evaluated.   Patient had normal echo 07/2017. Stress test showed positive EKG withno ischemia back on 07/2017

## 2018-03-01 NOTE — Telephone Encounter (Signed)
New Message:      FYI: pt would like [place] to know that he had to get an ekg done in his office today due to having an episode today. Pt states his EKG was normal.

## 2018-03-04 ENCOUNTER — Encounter: Payer: Self-pay | Admitting: Cardiology

## 2018-03-04 ENCOUNTER — Ambulatory Visit: Payer: BLUE CROSS/BLUE SHIELD | Admitting: Cardiology

## 2018-03-04 ENCOUNTER — Encounter (INDEPENDENT_AMBULATORY_CARE_PROVIDER_SITE_OTHER): Payer: Self-pay

## 2018-03-04 VITALS — BP 142/88 | HR 69 | Ht 67.5 in | Wt 233.8 lb

## 2018-03-04 DIAGNOSIS — R079 Chest pain, unspecified: Secondary | ICD-10-CM

## 2018-03-04 NOTE — Progress Notes (Signed)
03/04/2018 Brandon Villanueva.   09/23/1966  [id number]  Primary Physician Johny Blamer, MD Primary Cardiologist: Dr. Mayford Knife    Reason for Visit/CC: Dizziness   HPI:  Brandon Villanueva. is a 52 y.o. male who is being seen today for evaluation of dizziness and left sided chest pain.  He has been followed by Dr. Mayford Knife and and has undergone work-up for atypical chest pain in the past.  He has a history of abnormal exercise tolerance test in the past.  He also has a history of hyperlipidemia, HTN and OSA, on CPAP therapy. In 2013, he had a coronary CTA with morphology which showed atherosclerotic coronary artery disease involving the left anterior descending artery with no significant stenosis.  In 2016, he underwent coronary calcium score test which showed a calcium score of 0.7.  There was a trivial focal calcification in the LAD.  Recently in the fall 2018, he underwent an exercise Myoview test, which showed positive ECG response with 2 mm horizontal ST segment depression in the lateral leads and hypertensive response to exercise.  EF was estimated at 49% with apical hypokinesis however no ischemia was noted on nuclear images.  He has been managed medically with ASA, ACE-I and statin therapy.  He is on Pravachol.  Last lipid panel May 2018 showed an LDL level 76 mg/dL.    He presents to clinic today with complaint of dizziness.  Symptoms first started 4 days ago.  He reports intermittent dizziness but not necessarily associated with positional changes.  It comes and goes.  He works [employer] as a living and reports working in a hot environment, however he notes that he has been staying well-hydrated with fluids.  He also had a bout of diarrhea about 3 weeks ago but was apparently seen at the [facility] and reports that laboratory work was done and was normal.  We have requested that these results and office note be sent to our fax for further review.  The patient also notes a recent  episode of left-sided chest pain that came on the day after his dizziness.  He was at work when this occurred.  It was left-sided pain that was described as chest tightness.  It did not radiate.  It was not any worse with exertion.  The pain persisted throughout the day.  It lasted several hours.  Subsequently, he went to the clinic at his workplace.  He was checked out with an EKG and vital signs were also checked.  He was told his EKG was normal.  He was instructed to take it easy over the weekend.  He called our office and this appointment was made.  He denies any recurrent chest pain since that one episode 3 days ago.  However he has continued to have occasional dizziness that lasts only a couple seconds at a time.  He denies any prolonged symptoms.  No syncope or near syncope.  He reports full medication compliance.  His blood pressure has been running in the 140 systolic which he reports is a little bit higher than normal.  His blood pressures typically in the 120s systolic.  He denies any excessive caffeine.  He has not been eating any excessively salty foods.  EKG shows normal sinus rhythm.  No ischemic abnormalities.  EKG is unchanged compared to previous  Cardiac Studies  CTA w/ Morphology 2013   IMPRESSION:  1.  Atherosclerotic coronary artery disease involving the left anterior descending artery with  no significant stenosis.  2.  Coronary artery calcium score equal 42 which is 81 st percentile for patient's matches aging gender.  3.  Right coronary artery dominance.  Coronary Calcium Score 2016 FINDINGS: Non-cardiac: See separate report from [facility].  Ascending Aorta:  Normal caliber.  No calcification.  Pericardium: Normal.  Coronary arteries:  Normal origin.  IMPRESSION: Coronary calcium score of 0.7. This was 21 percentile for age and sex matched control. There is a trivial focal calcification in the LAD, given patient's young age the percentile appear  high, however is insignificant.  Exercise Myoview 07/2017 Study Highlights     Nuclear stress EF: 49%.  Horizontal ST segment depression ST segment depression of 2 mm was noted during stress.  Blood pressure demonstrated a hypertensive response to exercise.  This is an intermediate risk study.  The left ventricular ejection fraction is mildly decreased (45-54%).   No ischemia Soft tissue attenuation of the anterior apex on resting images EF estimated at 49% with apical hypokinesis Marked positive ECG response with 2 mm horizontal ST segment depression in lateral leads HTN response to exercise Suggest further f/u       Current Meds  Medication Sig  . aspirin 81 MG tablet Take 81 mg by mouth daily.  . Glucosamine-Chondroitin (GLUCOSAMINE CHONDR COMPLEX PO) Take by mouth daily.  . perindopril (ACEON) 4 MG tablet Take 4 mg by mouth daily.  . pravastatin (PRAVACHOL) 20 MG tablet Take 1 tablet (20 mg total) by mouth every evening. Please call and schedule an appt for further refills. 1st attempt  . traMADol (ULTRAM) 50 MG tablet Take 50 mg by mouth every 6 (six) hours as needed for moderate pain. Reported on 02/02/2016   Allergies  Allergen Reactions  . Lipitor [Atorvastatin] Other (See Comments)    Muscle aches   Past Medical History:  Diagnosis Date  . Arthritis   . Coronary artery calcification seen on CAT scan 02/02/2016  . Excessive daytime sleepiness 07/07/2015  . Hyperlipidemia with target LDL less than 70 02/02/2016  . Hypertension   . OSA (obstructive sleep apnea) 09/28/2015   Severe with AHI 47/hr with successful CPAP titration to 11cm H2O   Family History  Problem Relation Age of Onset  . Hyperlipidemia Father   . Coronary artery disease Father 56  . Diabetes Paternal Grandfather   . Heart attack Paternal Grandmother   . Coronary artery disease Paternal Grandmother    Past Surgical History:  Procedure Laterality Date  . INGUINAL HERNIA REPAIR Right 2010  .  VENTRAL HERNIA REPAIR     Repaired twice by Dr. Luretha Murphy   Social History   Socioeconomic History  . Marital status: Married    Spouse name: Not on file  . Number of children: Not on file  . Years of education: Not on file  . Highest education level: Not on file  Occupational History  . Not on file  Social Needs  . Financial resource strain: Not on file  . Food insecurity:    Worry: Not on file    Inability: Not on file  . Transportation needs:    Medical: Not on file    Non-medical: Not on file  Tobacco Use  . Smoking status: Never Smoker  . Smokeless tobacco: Never Used  Substance and Sexual Activity  . Alcohol use: No  . Drug use: No  . Sexual activity: Not on file  Lifestyle  . Physical activity:    Days per week: Not on  file    Minutes per session: Not on file  . Stress: Not on file  Relationships  . Social connections:    Talks on phone: Not on file    Gets together: Not on file    Attends religious service: Not on file    Active member of club or organization: Not on file    Attends meetings of clubs or organizations: Not on file    Relationship status: Not on file  . Intimate partner violence:    Fear of current or ex partner: Not on file    Emotionally abused: Not on file    Physically abused: Not on file    Forced sexual activity: Not on file  Other Topics Concern  . Not on file  Social History Narrative  . Not on file     Review of Systems: General: negative for chills, fever, night sweats or weight changes.  Cardiovascular: negative for chest pain, dyspnea on exertion, edema, orthopnea, palpitations, paroxysmal nocturnal dyspnea or shortness of breath Dermatological: negative for rash Respiratory: negative for cough or wheezing Urologic: negative for hematuria Abdominal: negative for nausea, vomiting, diarrhea, bright red blood per rectum, melena, or hematemesis Neurologic: negative for visual changes, syncope, or dizziness All other  systems reviewed and are otherwise negative except as noted above.   Physical Exam:  Blood pressure (!) 142/88, pulse 69, height 5' 7.5" (1.715 m), weight 233 lb 12.8 oz (106.1 kg).  General appearance: alert, cooperative and no distress Neck: no carotid bruit and no JVD Lungs: clear to auscultation bilaterally Heart: regular rate and rhythm, S1, S2 normal, no murmur, click, rub or gallop Extremities: extremities normal, atraumatic, no cyanosis or edema Pulses: 2+ and symmetric Skin: Skin color, texture, turgor normal. No rashes or lesions Neurologic: Grossly normal  EKG NSR 69 bpm -- personally reviewed   ASSESSMENT AND PLAN:   1. Dizziness: orthostatic VS were checked today and negative. He had diarrhea 3 weeks ago but reports good oral hydration. Also denies melena.  Physical exam is benign. ? Arrhythmia. We will order 2 week heart monitor for further evaluation. We will also request recent labs from [facility] be faxed to our office for review. Also ? BP. See below.    2. Chest Pain: atypical. Left sided pain, non radiating, non exertional that lasted several hrs on 03/01/18. No associated dyspnea. Resolved spontaneously. No recurrence over the weekend. No exertional symptoms. He notes EKG at his work clinic was normal. Minimal calcium on prior CT scans and negative exercise myoview in the fall of 2018. EKG today is normal. Given previous w/u and atypical features, we will not pursue any additional w/u at this time.   3. Coronary artery calcifications on CT scan with minimal calcium score: Coronary Calcium score in 2016 was 0.7. There was a trivial focal calcification in the LAD.  Recently in the fall 2018, he underwent an exercise Myoview test that was negative for ischemia. Continue statin therapy and BP control.   4. HTN: controlled on current regimen, but a bit higher than his usual baseline. Pt instructed to keep a check of his BP at home and to bring readings to his f/u visit. ? If  increase in BP is playing a role in his dizziness. If he proves to have persistently higher BPs, then we may further increase his ACE-I.     5. HLD: on Pravachol. Last Lipid pane was 02/2017 and LDL was 76 mg/dl. Goal given coronary calcifications is , 70 mg/dL.  Will repeat FLP.   6. OSA: compliant w/ CPAP.   Follow-Up in 2 weeks.   Brittainy Delmer Islam, M[facility] 03/04/2018 8:14 AM

## 2018-03-04 NOTE — Patient Instructions (Addendum)
Medication Instructions:  Your physician recommends that you continue on your current medications as directed. Please refer to the Current Medication list given to you today.     If you need a refill on your cardiac medications before your next appointment, please call your pharmacy.   Labwork:  NONE ORDERED  TODAY      Testing/Procedures:TO WEAR FOR 2 WEEKS  Your physician has recommended that you wear an event monitor. Event monitors are medical devices that record the heart's electrical activity. Doctors most often [place] these monitors to diagnose arrhythmias. Arrhythmias are problems with the speed or rhythm of the heartbeat. The monitor is a small, portable device. You can wear one while you do your normal daily activities. This is usually used to diagnose what is causing palpitations/syncope (passing out).     Follow-Up:  WITH SIMMONS OR ON TURNER TEAM IN 3 TO 4 WEEKS    Any Other Special Instructions Will Be Listed Below (If Applicable).

## 2018-03-14 ENCOUNTER — Other Ambulatory Visit: Payer: Self-pay | Admitting: Cardiology

## 2018-03-14 ENCOUNTER — Ambulatory Visit (INDEPENDENT_AMBULATORY_CARE_PROVIDER_SITE_OTHER): Payer: BLUE CROSS/BLUE SHIELD

## 2018-03-14 DIAGNOSIS — R42 Dizziness and giddiness: Secondary | ICD-10-CM

## 2018-03-14 DIAGNOSIS — R079 Chest pain, unspecified: Secondary | ICD-10-CM

## 2018-04-12 DIAGNOSIS — I10 Essential (primary) hypertension: Secondary | ICD-10-CM | POA: Diagnosis not present

## 2018-04-12 DIAGNOSIS — Z0000 Encounter for general adult medical examination without abnormal findings: Secondary | ICD-10-CM | POA: Diagnosis not present

## 2018-04-12 DIAGNOSIS — Z125 Encounter for screening for malignant neoplasm of prostate: Secondary | ICD-10-CM | POA: Diagnosis not present

## 2018-04-12 DIAGNOSIS — E7800 Pure hypercholesterolemia, unspecified: Secondary | ICD-10-CM | POA: Diagnosis not present

## 2018-04-24 ENCOUNTER — Encounter: Payer: Self-pay | Admitting: Cardiology

## 2018-04-24 ENCOUNTER — Telehealth: Payer: Self-pay | Admitting: *Deleted

## 2018-04-24 ENCOUNTER — Ambulatory Visit: Payer: BLUE CROSS/BLUE SHIELD | Admitting: Cardiology

## 2018-04-24 ENCOUNTER — Encounter (INDEPENDENT_AMBULATORY_CARE_PROVIDER_SITE_OTHER): Payer: Self-pay

## 2018-04-24 VITALS — BP 124/68 | HR 59 | Ht 67.5 in | Wt 230.8 lb

## 2018-04-24 DIAGNOSIS — G4733 Obstructive sleep apnea (adult) (pediatric): Secondary | ICD-10-CM

## 2018-04-24 DIAGNOSIS — I2510 Atherosclerotic heart disease of native coronary artery without angina pectoris: Secondary | ICD-10-CM

## 2018-04-24 DIAGNOSIS — I10 Essential (primary) hypertension: Secondary | ICD-10-CM

## 2018-04-24 DIAGNOSIS — E785 Hyperlipidemia, unspecified: Secondary | ICD-10-CM | POA: Diagnosis not present

## 2018-04-24 MED ORDER — PRAVASTATIN SODIUM 40 MG PO TABS: 40.0000 mg | ORAL_TABLET | Freq: Every evening | ORAL | 3 refills | Status: DC

## 2018-04-24 NOTE — Telephone Encounter (Signed)
Resmed Airfit F30 mask  [facility]

## 2018-04-24 NOTE — Patient Instructions (Signed)
Medication Instructions:  Your physician has recommended you make the following change in your medication:  INCREASE: Pravastatin to 40 mg once a day  If you need a refill on your cardiac medications, please contact your pharmacy first.  Labwork: Your physician recommends that you return for lab work in: 6 weeks for fasting lipid panel and liver function test   Testing/Procedures: None ordered   Follow-Up: Your physician wants you to follow-up in: 1 year with Dr. Mayford Knifeurner. You will receive a reminder letter in the mail two months in advance. If you don't receive a letter, please call our office to schedule the follow-up appointment.  Any Other Special Instructions Will Be Listed Below (If Applicable).   [facility]    Lyda PeroneRena Emilio Baylock, RN  [phone]  If you need a refill on your cardiac medications before your next appointment, please call your pharmacy.

## 2018-04-24 NOTE — Telephone Encounter (Signed)
-----   Message from Phineas Semenonnisha Robertson, RN sent at 04/24/2018 10:12 AM EDT ----- Regarding: dme order DME order placed  Thanks  Rena

## 2018-04-24 NOTE — Progress Notes (Signed)
Cardiology Office Note:    Date:  04/24/2018   ID:  Brandon Pu., DOB June 05, 1966, MRN [id number]  PCP:  Brandon Blamer, MD  Cardiologist:  No primary care provider on file.    Referring MD: Brandon Blamer, MD   Chief Complaint  Patient presents with  . Coronary Artery Disease  . Hypertension  . Sleep Apnea  . Hyperlipidemia    History of Present Illness:    Brandon Villanueva. is a 52 y.o. male with a hx of has undergone work-up for atypical chest pain in the past.  He has a history of abnormal exercise tolerance test in the past.  He also has a history of hyperlipidemia, HTN and OSA, on CPAP therapy. In 2013, he had a coronary CTA with morphology which showed atherosclerotic coronary artery disease involving the left anterior descending artery with no significant stenosis.  In 2016, he underwent coronary calcium score test which showed a calcium score of 0.7.  There was a trivial focal calcification in the LAD.  Recently in the fall 2018, he underwent an exercise Myoview test, which showed positive ECG response with 2 mm horizontal ST segment depression in the lateral leads and hypertensive response to exercise.  EF was estimated at 49% with apical hypokinesis however no ischemia was noted on nuclear images.  He has been managed medically with ASA, ACE-I and statin therapy.  He is on Pravachol.    He was seen by Brandon Villanueva on 03/04/2018 with complaints of dizziness.  This occurred in the setting of diarrhea as well as working in a very hot environment.  He also complained of left-sided chest pain that would come on after the dizziness.  An event monitor was ordered to evaluate for arrhythmias that was normal.  The chest pain was felt to be atypical and given prior cardiac work-up including a nuclear stress test back in the fall no further testing was done.  He is now here for follow-up.  He also has a history of sleep apnea and is on CPAP.  He is doing well with his CPAP device.   He tolerates the mask and feels the pressure is adequate.  Since going on CPAP he feels rested in the am and has no significant daytime sleepiness.  He denies any significant mouth or nasal dryness or nasal congestion.  He does not think that he snores.      Past Medical History:  Diagnosis Date  . Arthritis   . Benign essential HTN 02/02/2016  . Chest pain 07/07/2015  . Coronary artery calcification seen on CAT scan 02/02/2016  . Excessive daytime sleepiness 07/07/2015  . Hyperlipidemia with target LDL less than 70 02/02/2016  . Hypertension   . Morbid obesity (HCC) 07/07/2015  . OSA (obstructive sleep apnea) 09/28/2015   Severe with AHI 47/hr with successful CPAP titration to 11cm H2O  . Recurrent right inguinal hernia s/p lap repair with mesh 06/09/14 06/01/2014  . Snoring 07/07/2015    Past Surgical History:  Procedure Laterality Date  . INGUINAL HERNIA REPAIR Right 2010  . VENTRAL HERNIA REPAIR     Repaired twice by Dr. Luretha Murphy    Current Medications: Current Meds  Medication Sig  . aspirin 81 MG tablet Take 81 mg by mouth daily.  Marland Kitchen OVER THE COUNTER MEDICATION Deep blue polyphenel complex  . OVER THE COUNTER MEDICATION xeo mega essential oil  . OVER THE COUNTER MEDICATION Micro plex vmx  . OVER THE COUNTER MEDICATION Alpha  crst cellular vitality complex  . perindopril (ACEON) 4 MG tablet Take 4 mg by mouth daily.  . pravastatin (PRAVACHOL) 20 MG tablet Take 1 tablet (20 mg total) by mouth every evening. Please call and schedule an appt for further refills. 1st attempt  . traMADol (ULTRAM) 50 MG tablet Take 50 mg by mouth every 6 (six) hours as needed for moderate pain. Reported on 02/02/2016     Allergies:   Lipitor [atorvastatin]   Social History   Socioeconomic History  . Marital status: Married    Spouse name: Not on file  . Number of children: Not on file  . Years of education: Not on file  . Highest education level: Not on file  Occupational History  . Not on file    Social Needs  . Financial resource strain: Not on file  . Food insecurity:    Worry: Not on file    Inability: Not on file  . Transportation needs:    Medical: Not on file    Non-medical: Not on file  Tobacco Use  . Smoking status: Never Smoker  . Smokeless tobacco: Never Used  Substance and Sexual Activity  . Alcohol use: No  . Drug use: No  . Sexual activity: Not on file  Lifestyle  . Physical activity:    Days per week: Not on file    Minutes per session: Not on file  . Stress: Not on file  Relationships  . Social connections:    Talks on phone: Not on file    Gets together: Not on file    Attends religious service: Not on file    Active member of club or organization: Not on file    Attends meetings of clubs or organizations: Not on file    Relationship status: Not on file  Other Topics Concern  . Not on file  Social History Narrative  . Not on file     Family History: The patient's family history includes Coronary artery disease in his paternal grandmother; Coronary artery disease (age of onset: 5) in his father; Diabetes in his paternal grandfather; Heart attack in his paternal grandmother; Hyperlipidemia in his father.  ROS:   Please see the history of present illness.    ROS  All other systems reviewed and negative.   EKGs/Labs/Other Studies Reviewed:    The following studies were reviewed today: PAP download  EKG:  EKG is not ordered today.    Recent Labs: No results found for requested labs within last 8760 hours.   Recent Lipid Panel    Component Value Date/Time   CHOL 140 03/01/2017 0848   TRIG 42 03/01/2017 0848   HDL 56 03/01/2017 0848   CHOLHDL 2.5 03/01/2017 0848   CHOLHDL 3.2 03/29/2016 0750   VLDL 18 03/29/2016 0750   LDLCALC 76 03/01/2017 0848    Physical Exam:    VS:  BP 124/68   Pulse (!) 59   Ht 5' 7.5" (1.715 m)   Wt 230 lb 12.8 oz (104.7 kg)   SpO2 96%   BMI 35.61 kg/m     Wt Readings from Last 3 Encounters:   04/24/18 230 lb 12.8 oz (104.7 kg)  03/04/18 233 lb 12.8 oz (106.1 kg)  02/19/17 211 lb 12.8 oz (96.1 kg)     GEN:  Well nourished, well developed in no acute distress HEENT: Normal NECK: No JVD; No carotid bruits LYMPHATICS: No lymphadenopathy CARDIAC: RRR, no murmurs, rubs, gallops RESPIRATORY:  Clear to auscultation without  rales, wheezing or rhonchi  ABDOMEN: Soft, non-tender, non-distended MUSCULOSKELETAL:  No edema; No deformity  SKIN: Warm and dry NEUROLOGIC:  Alert and oriented x 3 PSYCHIATRIC:  Normal affect   ASSESSMENT:    1. OSA (obstructive sleep apnea)   2. Coronary artery calcification seen on CAT scan   3. Benign essential HTN   4. Morbid obesity (HCC)   5. Hyperlipidemia with target LDL less than 70    PLAN:    In order of problems listed above:  1.  OSA - the patient is tolerating PAP therapy well without any problems. The PAP download was reviewed today and showed an AHI of 0.8/hr on 11 cm H2O with 100% compliance in using more than 4 hours nightly.  The patient has been using and benefiting from PAP use and will continue to benefit from therapy.   2.  Coronary artery calcifications on Chest CT -stress Myoview was normal back in the fall.  Continue on statin therapy and ASA 81 mg daily.  3.  Hypertension -he is well controlled on exam today.  He will continue on perindopril 4 mg daily.  Creatinine was stable at 0.78 on 04/12/2018. 4.  Obesity - I have encouraged him to get into a routine exercise program and cut back on carbs and portions.   5.  Hyperlipidemia -LDL goal is less than 70.  LDL was 89 on recent labs.  I will increase his Pravachol to 40 mg daily and repeat FLP and ALT in 6 weeks.   Medication Adjustments/Labs and Tests Ordered: Current medicines are reviewed at length with the patient today.  Concerns regarding medicines are outlined above.  No orders of the defined types were placed in this encounter.  No orders of the defined types were  placed in this encounter.   Signed, Armanda Magicraci Azlaan Isidore, MD  04/24/2018 8:26 AM    [facility]

## 2018-06-05 ENCOUNTER — Other Ambulatory Visit: Payer: BLUE CROSS/BLUE SHIELD | Admitting: *Deleted

## 2018-06-05 DIAGNOSIS — E785 Hyperlipidemia, unspecified: Secondary | ICD-10-CM | POA: Diagnosis not present

## 2018-06-05 LAB — HEPATIC FUNCTION PANEL
ALK PHOS: 93 IU/L (ref 39–117)
ALT: 28 IU/L (ref 0–44)
AST: 17 IU/L (ref 0–40)
Albumin: 4.2 g/dL (ref 3.5–5.5)
Bilirubin Total: 0.3 mg/dL (ref 0.0–1.2)
Bilirubin, Direct: 0.1 mg/dL (ref 0.00–0.40)
TOTAL PROTEIN: 6.3 g/dL (ref 6.0–8.5)

## 2018-06-05 LAB — LIPID PANEL
CHOLESTEROL TOTAL: 142 mg/dL (ref 100–199)
Chol/HDL Ratio: 3.1 ratio (ref 0.0–5.0)
HDL: 46 mg/dL (ref >39)
LDL CALC: 83 mg/dL (ref 0–99)
TRIGLYCERIDES: 63 mg/dL (ref 0–149)
VLDL Cholesterol Cal: 13 mg/dL (ref 5–40)

## 2018-06-06 ENCOUNTER — Telehealth: Payer: Self-pay

## 2018-06-06 DIAGNOSIS — E785 Hyperlipidemia, unspecified: Secondary | ICD-10-CM

## 2018-06-06 MED ORDER — PRAVASTATIN SODIUM 80 MG PO TABS: 80.0000 mg | ORAL_TABLET | Freq: Every evening | ORAL | 11 refills | Status: DC

## 2018-06-06 NOTE — Telephone Encounter (Signed)
Patient called today to ask about his new mask that was ordered on 04/24/18 and never received. I reached out to the re-supply department and was informed by Denita that the patient was in collections and was required to make 2 consecutive payments before he would be eligible to order supplies. After checking the patient had made the required payments but he is not eligible to get a another new mask until August 01 2018 since he just got a new mask 01/30/18. I reached out to the patient and informed him that he should call the re-supply team at [phone] opt #1. Patient was not able to write the information down so I text it to him per his request. Pt is aware and agreeable to treatment.

## 2018-06-06 NOTE — Telephone Encounter (Signed)
-----   Message from Quintella Reichertraci R Turner, MD sent at 06/05/2018  6:02 PM EDT ----- Increase pravachol to 80mg  daily and repeat FLP and ALT in 6 weeks.  LDL goal is < 70.

## 2018-06-06 NOTE — Telephone Encounter (Signed)
Called and made patient aware of results and recommendations to increase pravastatin to 80 mg QD. Patient will come in for fasting LIPIDS and ALT on 9/25.

## 2018-06-10 DIAGNOSIS — M25561 Pain in right knee: Secondary | ICD-10-CM | POA: Diagnosis not present

## 2018-07-04 DIAGNOSIS — M25561 Pain in right knee: Secondary | ICD-10-CM | POA: Diagnosis not present

## 2018-07-10 DIAGNOSIS — M1711 Unilateral primary osteoarthritis, right knee: Secondary | ICD-10-CM | POA: Diagnosis not present

## 2018-07-10 DIAGNOSIS — S83281D Other tear of lateral meniscus, current injury, right knee, subsequent encounter: Secondary | ICD-10-CM | POA: Diagnosis not present

## 2018-07-10 DIAGNOSIS — S83241D Other tear of medial meniscus, current injury, right knee, subsequent encounter: Secondary | ICD-10-CM | POA: Diagnosis not present

## 2018-07-24 ENCOUNTER — Other Ambulatory Visit: Payer: BLUE CROSS/BLUE SHIELD | Admitting: *Deleted

## 2018-07-24 ENCOUNTER — Encounter (INDEPENDENT_AMBULATORY_CARE_PROVIDER_SITE_OTHER): Payer: Self-pay

## 2018-07-24 DIAGNOSIS — E785 Hyperlipidemia, unspecified: Secondary | ICD-10-CM | POA: Diagnosis not present

## 2018-07-24 LAB — ALT: ALT: 35 IU/L (ref 0–44)

## 2018-07-24 LAB — LIPID PANEL
CHOLESTEROL TOTAL: 139 mg/dL (ref 100–199)
Chol/HDL Ratio: 2.8 ratio (ref 0.0–5.0)
HDL: 49 mg/dL (ref >39)
LDL CALC: 80 mg/dL (ref 0–99)
TRIGLYCERIDES: 51 mg/dL (ref 0–149)
VLDL Cholesterol Cal: 10 mg/dL (ref 5–40)

## 2018-08-29 DIAGNOSIS — S83206D Unspecified tear of unspecified meniscus, current injury, right knee, subsequent encounter: Secondary | ICD-10-CM | POA: Diagnosis not present

## 2018-09-24 DIAGNOSIS — J069 Acute upper respiratory infection, unspecified: Secondary | ICD-10-CM | POA: Diagnosis not present

## 2018-10-09 DIAGNOSIS — E7800 Pure hypercholesterolemia, unspecified: Secondary | ICD-10-CM | POA: Diagnosis not present

## 2018-10-09 DIAGNOSIS — I10 Essential (primary) hypertension: Secondary | ICD-10-CM | POA: Diagnosis not present

## 2018-10-09 DIAGNOSIS — S83203A Other tear of unspecified meniscus, current injury, right knee, initial encounter: Secondary | ICD-10-CM | POA: Diagnosis not present

## 2018-10-09 DIAGNOSIS — G4733 Obstructive sleep apnea (adult) (pediatric): Secondary | ICD-10-CM | POA: Diagnosis not present

## 2018-10-17 DIAGNOSIS — G8918 Other acute postprocedural pain: Secondary | ICD-10-CM | POA: Diagnosis not present

## 2018-10-17 DIAGNOSIS — M94261 Chondromalacia, right knee: Secondary | ICD-10-CM | POA: Diagnosis not present

## 2018-10-17 DIAGNOSIS — S83231A Complex tear of medial meniscus, current injury, right knee, initial encounter: Secondary | ICD-10-CM | POA: Diagnosis not present

## 2018-10-17 DIAGNOSIS — S83271A Complex tear of lateral meniscus, current injury, right knee, initial encounter: Secondary | ICD-10-CM | POA: Diagnosis not present

## 2018-10-25 DIAGNOSIS — M25561 Pain in right knee: Secondary | ICD-10-CM | POA: Diagnosis not present

## 2018-10-29 DIAGNOSIS — M25561 Pain in right knee: Secondary | ICD-10-CM | POA: Diagnosis not present

## 2018-11-01 DIAGNOSIS — M25561 Pain in right knee: Secondary | ICD-10-CM | POA: Diagnosis not present

## 2018-11-05 DIAGNOSIS — M25561 Pain in right knee: Secondary | ICD-10-CM | POA: Diagnosis not present

## 2018-11-08 DIAGNOSIS — Z4789 Encounter for other orthopedic aftercare: Secondary | ICD-10-CM | POA: Diagnosis not present

## 2018-11-08 DIAGNOSIS — M25561 Pain in right knee: Secondary | ICD-10-CM | POA: Diagnosis not present

## 2018-11-12 DIAGNOSIS — M25561 Pain in right knee: Secondary | ICD-10-CM | POA: Diagnosis not present

## 2018-11-13 DIAGNOSIS — G4733 Obstructive sleep apnea (adult) (pediatric): Secondary | ICD-10-CM | POA: Diagnosis not present

## 2018-11-15 DIAGNOSIS — M25561 Pain in right knee: Secondary | ICD-10-CM | POA: Diagnosis not present

## 2018-11-19 DIAGNOSIS — M25561 Pain in right knee: Secondary | ICD-10-CM | POA: Diagnosis not present

## 2018-11-25 DIAGNOSIS — G4733 Obstructive sleep apnea (adult) (pediatric): Secondary | ICD-10-CM | POA: Diagnosis not present

## 2018-12-24 DIAGNOSIS — J40 Bronchitis, not specified as acute or chronic: Secondary | ICD-10-CM | POA: Diagnosis not present

## 2019-04-15 DIAGNOSIS — E7800 Pure hypercholesterolemia, unspecified: Secondary | ICD-10-CM | POA: Diagnosis not present

## 2019-04-15 DIAGNOSIS — M79604 Pain in right leg: Secondary | ICD-10-CM | POA: Diagnosis not present

## 2019-04-15 DIAGNOSIS — M79673 Pain in unspecified foot: Secondary | ICD-10-CM | POA: Diagnosis not present

## 2019-04-15 DIAGNOSIS — I10 Essential (primary) hypertension: Secondary | ICD-10-CM | POA: Diagnosis not present

## 2019-04-15 DIAGNOSIS — R109 Unspecified abdominal pain: Secondary | ICD-10-CM | POA: Diagnosis not present

## 2019-04-15 DIAGNOSIS — Z125 Encounter for screening for malignant neoplasm of prostate: Secondary | ICD-10-CM | POA: Diagnosis not present

## 2019-04-15 DIAGNOSIS — Z0000 Encounter for general adult medical examination without abnormal findings: Secondary | ICD-10-CM | POA: Diagnosis not present

## 2019-04-22 ENCOUNTER — Encounter: Payer: Self-pay | Admitting: Podiatry

## 2019-04-22 ENCOUNTER — Ambulatory Visit: Payer: BC Managed Care – PPO | Admitting: Podiatry

## 2019-04-22 ENCOUNTER — Other Ambulatory Visit: Payer: Self-pay

## 2019-04-22 ENCOUNTER — Ambulatory Visit (INDEPENDENT_AMBULATORY_CARE_PROVIDER_SITE_OTHER): Payer: BC Managed Care – PPO

## 2019-04-22 ENCOUNTER — Other Ambulatory Visit: Payer: Self-pay | Admitting: Podiatry

## 2019-04-22 VITALS — BP 131/77 | Temp 97.9°F

## 2019-04-22 DIAGNOSIS — M79672 Pain in left foot: Secondary | ICD-10-CM | POA: Diagnosis not present

## 2019-04-22 DIAGNOSIS — M779 Enthesopathy, unspecified: Secondary | ICD-10-CM | POA: Diagnosis not present

## 2019-04-22 DIAGNOSIS — M79671 Pain in right foot: Secondary | ICD-10-CM | POA: Diagnosis not present

## 2019-04-22 DIAGNOSIS — M7751 Other enthesopathy of right foot: Secondary | ICD-10-CM | POA: Diagnosis not present

## 2019-04-22 DIAGNOSIS — M722 Plantar fascial fibromatosis: Secondary | ICD-10-CM

## 2019-04-22 DIAGNOSIS — M21622 Bunionette of left foot: Secondary | ICD-10-CM

## 2019-04-22 DIAGNOSIS — M7752 Other enthesopathy of left foot: Secondary | ICD-10-CM | POA: Diagnosis not present

## 2019-04-22 MED ORDER — MELOXICAM 15 MG PO TABS: 15.0000 mg | ORAL_TABLET | Freq: Every day | ORAL | 0 refills | Status: DC

## 2019-05-01 NOTE — Progress Notes (Signed)
Subjective:   Patient ID: Brandon Villanueva., male   DOB: 53 y.o.   MRN: [id number]   HPI 53 year old male presents the office today for concerns of pain both of his feet.  Pain is in the left foot mild lateral aspect of the right foot he has been submetatarsal 4 and fifth toes as well 1 time.  He states it happened about 4 to 5 years ago when it started.  Patient states it is due to an old injury.  Hurts more with pressure.  Previously tried orthotics.  No numbness or tingling.   Review of Systems  All other systems reviewed and are negative.  Past Medical History:  Diagnosis Date  . Arthritis   . Benign essential HTN 02/02/2016  . Chest pain 07/07/2015  . Coronary artery calcification seen on CAT scan 02/02/2016  . Excessive daytime sleepiness 07/07/2015  . Hyperlipidemia with target LDL less than 70 02/02/2016  . Hypertension   . Morbid obesity ([place]) 07/07/2015  . OSA (obstructive sleep apnea) 09/28/2015   Severe with AHI 47/hr with successful CPAP titration to 11cm H2O  . Recurrent right inguinal hernia s/p lap repair with mesh 06/09/14 06/01/2014  . Snoring 07/07/2015    Past Surgical History:  Procedure Laterality Date  . INGUINAL HERNIA REPAIR Right 2010  . VENTRAL HERNIA REPAIR     Repaired twice by Dr. Johnathan Hausen     Current Outpatient Medications:  .  aspirin 81 MG tablet, Take 81 mg by mouth daily., Disp: , Rfl:  .  OVER THE COUNTER MEDICATION, Deep blue polyphenel complex, Disp: , Rfl:  .  OVER THE COUNTER MEDICATION, xeo mega essential oil, Disp: , Rfl:  .  OVER THE COUNTER MEDICATION, Micro plex vmx, Disp: , Rfl:  .  OVER THE COUNTER MEDICATION, Alpha crst cellular vitality complex, Disp: , Rfl:  .  perindopril (ACEON) 4 MG tablet, Take 4 mg by mouth daily., Disp: , Rfl:  .  pravastatin (PRAVACHOL) 80 MG tablet, Take 1 tablet (80 mg total) by mouth every evening., Disp: 30 tablet, Rfl: 11 .  tiZANidine (ZANAFLEX) 2 MG tablet, tizanidine 2 mg tablet  TAKE 1/2-2 TABLETS AT  BEDTIME AS NEEDED FOR MUSCLE SPASMS, Disp: , Rfl:  .  traMADol (ULTRAM) 50 MG tablet, Take 50 mg by mouth every 6 (six) hours as needed for moderate pain. Reported on 02/02/2016, Disp: , Rfl:  .  meloxicam (MOBIC) 15 MG tablet, Take 1 tablet (15 mg total) by mouth daily., Disp: 30 tablet, Rfl: 0  Allergies  Allergen Reactions  . Lipitor [Atorvastatin] Other (See Comments)    Muscle aches          Objective:  Physical Exam  General: AAO x3, NAD  Dermatological: Skin is warm, dry and supple bilateral. Nails x 10 are well manicured; remaining integument appears unremarkable at this time. There are no open sores, no preulcerative lesions, no rash or signs of infection present.  Vascular: Dorsalis Pedis artery and Posterior Tibial artery pedal pulses are 2/4 bilateral with immedate capillary fill time.There is no pain with calf compression, swelling, warmth, erythema.   Neruologic: Grossly intact via light touch bilateral. Protective threshold with Semmes Wienstein monofilament intact to all pedal sites bilateral.   Musculoskeletal: On the left foot there is tenderness on the lateral aspect of the foot mostly on the area the tailor's bunion there is localized edema to this area without any erythema or warmth.  There is tenderness to the dorsal aspect  of the right foot but no specific area pinpoint tenderness.  There is no edema, erythema.  No palpable neuroma.  Muscular strength 5/5 in all groups tested bilateral.  Gait: Unassisted, Nonantalgic.       Assessment:   53 year old male left foot capsulitis, tailor's bunion, right dorsal midfoot pain/capsulitis     Plan:  -Treatment options discussed including all alternatives, risks, and complications -Etiology of symptoms were discussed -X-rays were obtained and reviewed with the patient. Tailors bunions present.  No evidence of acute fracture. -There is an injection on the left side.  Areas cleaned with alcohol.  Mixture of 0.5 cc  Kenalog 10, 0.5 cc of Marcaine plain, 0.5 cc of lidocaine plain was infiltrated into the lateral aspect of foot on the area of tailor's bunion without complications.  Postinjection care was discussed. -Prescribed mobic. Discussed side effects of the medication and directed to stop if any are to occur and call the office. -Offloading pads dispensed. -Shoe modifications.  No follow-ups on file.  Vivi BarrackMatthew R Shenee Wignall DPM

## 2019-05-14 ENCOUNTER — Other Ambulatory Visit: Payer: Self-pay | Admitting: Podiatry

## 2019-05-15 ENCOUNTER — Ambulatory Visit: Payer: BC Managed Care – PPO | Admitting: Podiatry

## 2019-05-15 ENCOUNTER — Other Ambulatory Visit: Payer: BC Managed Care – PPO | Admitting: Orthotics

## 2019-05-15 ENCOUNTER — Other Ambulatory Visit: Payer: Self-pay

## 2019-05-15 DIAGNOSIS — M779 Enthesopathy, unspecified: Secondary | ICD-10-CM | POA: Diagnosis not present

## 2019-05-15 DIAGNOSIS — M21622 Bunionette of left foot: Secondary | ICD-10-CM

## 2019-05-15 NOTE — Progress Notes (Signed)
Subjective: 53 year old male presents the office today for Pap evaluation of bilateral foot pain and orthotics.  He states that overall he is doing much better.  Injection helped significantly on the left foot.  He states he did modify the inserts he has already and since then the knee has been doing much better he is currently not experiencing discomfort on the right side.  He gets some tightness to his feet after being on his feet.  No swelling or redness.  No recent injury or changes. Denies any systemic complaints such as fevers, chills, nausea, vomiting. No acute changes since last appointment, and no other complaints at this time.   Objective: AAO x3, NAD DP/PT pulses palpable bilaterally, CRT less than 3 seconds There is currently no significant tenderness on the tailor's bunion left side there is no fluid.  No edema, erythema.  There is no significant area of pinpoint tenderness identified bilaterally.  Specifically there is no pain on the right foot over where he had discomfort last appointment.  No pain of the toe is bunion. No open lesions or pre-ulcerative lesions.  No pain with calf compression, swelling, warmth, erythema  Assessment: Bilateral chronic pain   Plan: -All treatment options discussed with the patient including all alternatives, risks, complications.  -We hold off another steroid injection today as he is doing well and no pain currently.  Orthotics were dispensed today.  Oral and written break-in instructions were provided. -Patient encouraged to call the office with any questions, concerns, change in symptoms.   Trula Slade DPM

## 2019-06-22 ENCOUNTER — Other Ambulatory Visit: Payer: Self-pay | Admitting: Podiatry

## 2019-06-23 ENCOUNTER — Other Ambulatory Visit: Payer: BC Managed Care – PPO | Admitting: Orthotics

## 2019-06-23 ENCOUNTER — Ambulatory Visit: Payer: BC Managed Care – PPO | Admitting: Podiatry

## 2019-07-18 ENCOUNTER — Other Ambulatory Visit: Payer: Self-pay | Admitting: Cardiology

## 2019-07-24 ENCOUNTER — Other Ambulatory Visit: Payer: Self-pay | Admitting: Podiatry

## 2019-07-25 ENCOUNTER — Other Ambulatory Visit: Payer: Self-pay | Admitting: Cardiology

## 2019-08-01 DIAGNOSIS — G4733 Obstructive sleep apnea (adult) (pediatric): Secondary | ICD-10-CM | POA: Diagnosis not present

## 2019-08-11 DIAGNOSIS — M5412 Radiculopathy, cervical region: Secondary | ICD-10-CM | POA: Diagnosis not present

## 2019-08-11 DIAGNOSIS — M8949 Other hypertrophic osteoarthropathy, multiple sites: Secondary | ICD-10-CM | POA: Diagnosis not present

## 2019-08-29 ENCOUNTER — Other Ambulatory Visit: Payer: Self-pay

## 2019-08-29 DIAGNOSIS — Z20822 Contact with and (suspected) exposure to covid-19: Secondary | ICD-10-CM

## 2019-08-30 LAB — NOVEL CORONAVIRUS, NAA: SARS-CoV-2, NAA: NOT DETECTED

## 2019-09-22 ENCOUNTER — Other Ambulatory Visit: Payer: Self-pay | Admitting: Family Medicine

## 2019-09-22 DIAGNOSIS — M5412 Radiculopathy, cervical region: Secondary | ICD-10-CM

## 2019-09-22 DIAGNOSIS — M1711 Unilateral primary osteoarthritis, right knee: Secondary | ICD-10-CM | POA: Diagnosis not present

## 2019-10-09 DIAGNOSIS — Z03818 Encounter for observation for suspected exposure to other biological agents ruled out: Secondary | ICD-10-CM | POA: Diagnosis not present

## 2019-10-09 DIAGNOSIS — J069 Acute upper respiratory infection, unspecified: Secondary | ICD-10-CM | POA: Diagnosis not present

## 2019-10-10 ENCOUNTER — Other Ambulatory Visit: Payer: Self-pay

## 2019-10-10 DIAGNOSIS — Z20822 Contact with and (suspected) exposure to covid-19: Secondary | ICD-10-CM

## 2019-10-11 LAB — NOVEL CORONAVIRUS, NAA: SARS-CoV-2, NAA: NOT DETECTED

## 2019-10-12 ENCOUNTER — Ambulatory Visit
Admission: RE | Admit: 2019-10-12 | Discharge: 2019-10-12 | Disposition: A | Discharge status: Home / Self Care | Payer: BC Managed Care – PPO | Source: Ambulatory Visit | Attending: Family Medicine | Admitting: Family Medicine

## 2019-10-12 ENCOUNTER — Other Ambulatory Visit: Payer: Self-pay

## 2019-10-12 DIAGNOSIS — M5412 Radiculopathy, cervical region: Secondary | ICD-10-CM

## 2019-10-12 DIAGNOSIS — M50222 Other cervical disc displacement at C5-C6 level: Secondary | ICD-10-CM | POA: Diagnosis not present

## 2019-10-12 IMAGING — MR MR CERVICAL SPINE W/O CM
4 of 5 series · 16 of 48 positions shown · non-contrast
Comparison: MRI cervical spine [DATE]

CLINICAL DATA: Cervical radiculopathy. Right neck and shoulder and
arm pain

EXAM:
MRI CERVICAL SPINE WITHOUT CONTRAST
TECHNIQUE: Multiplanar, multisequence MR imaging of the cervical spine was
performed. No intravenous contrast was administered.

[Series 5: T1 · sagittal · 3.0mm · 0.41mm/px · 3 of 13 slices shown]
[im 3/13]
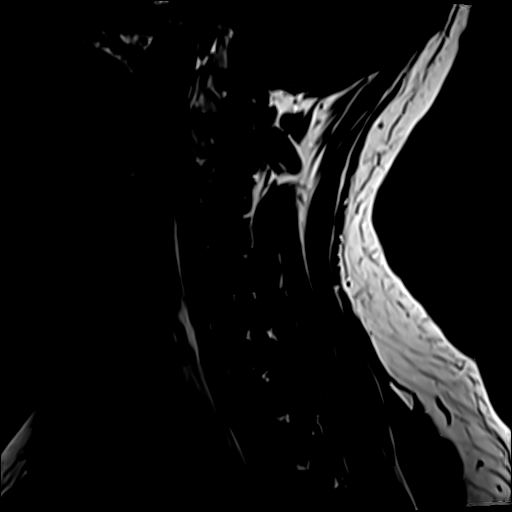
[im 8/13]
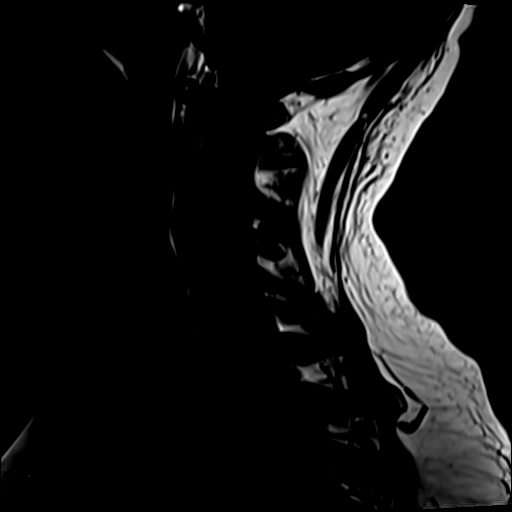
[im 13/13]
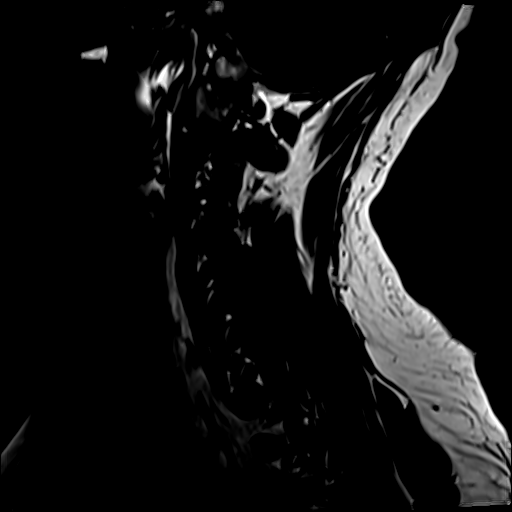

[Series 6: T2 · sagittal · 3.0mm · 0.33mm/px · 6 of 13 slices shown (1 of 2)]
[im 1/13]
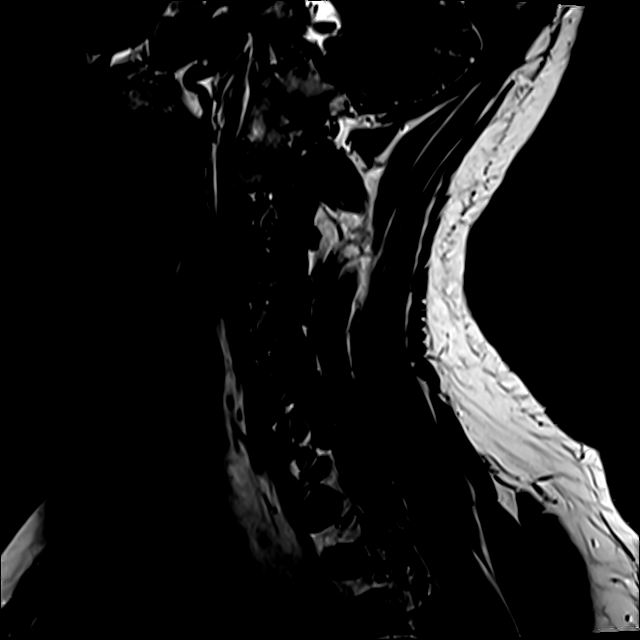
[im 3/13]
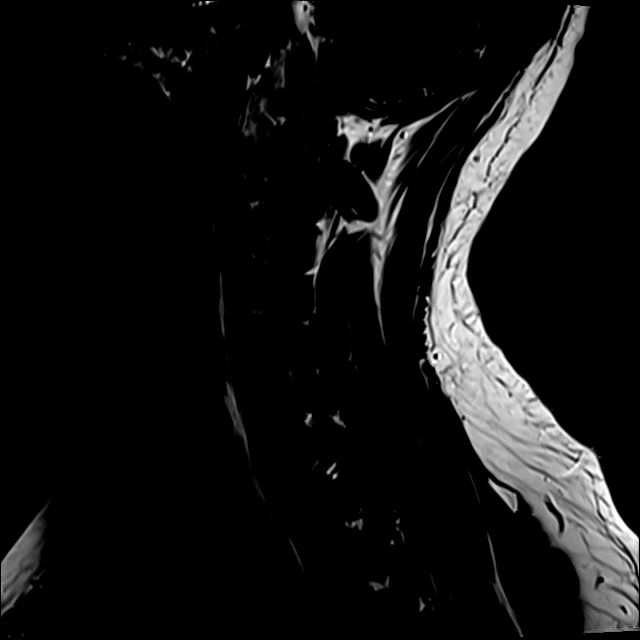
[im 5/13]
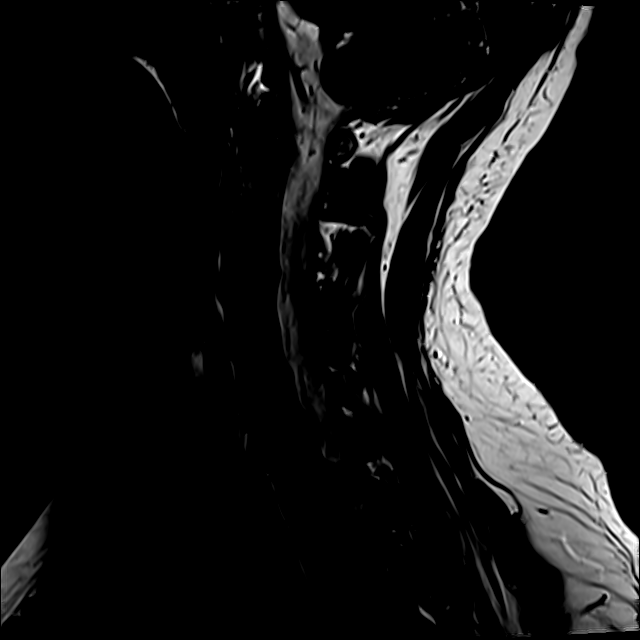
[im 8/13]
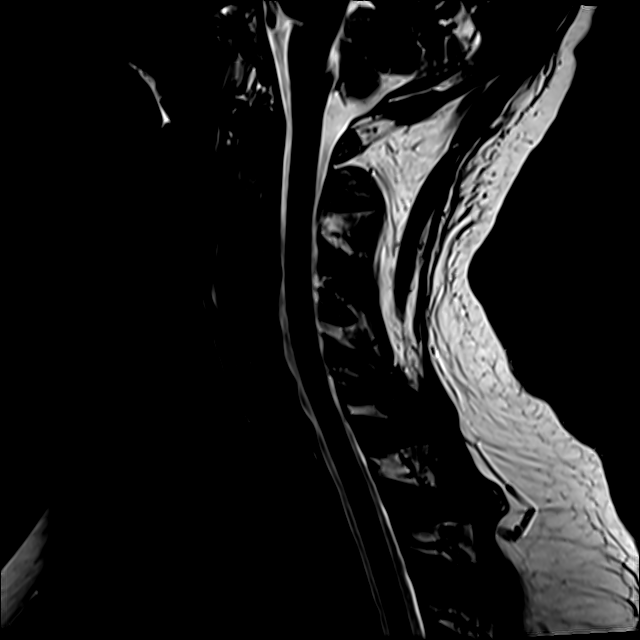
[im 10/13]
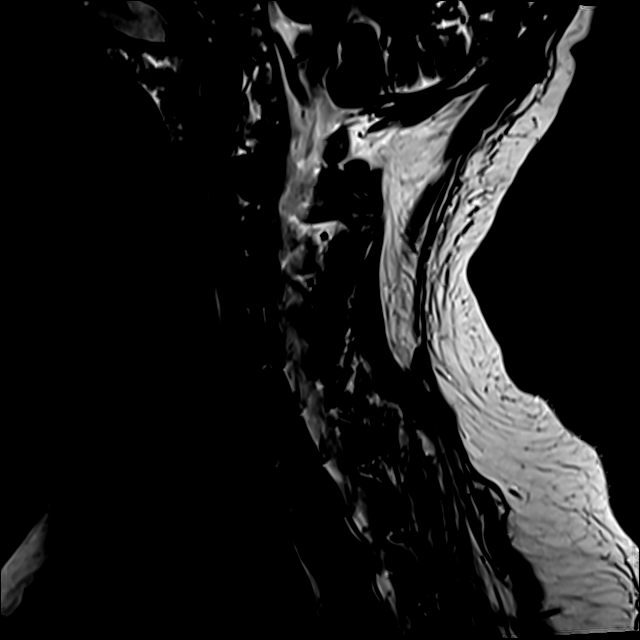
[im 13/13]
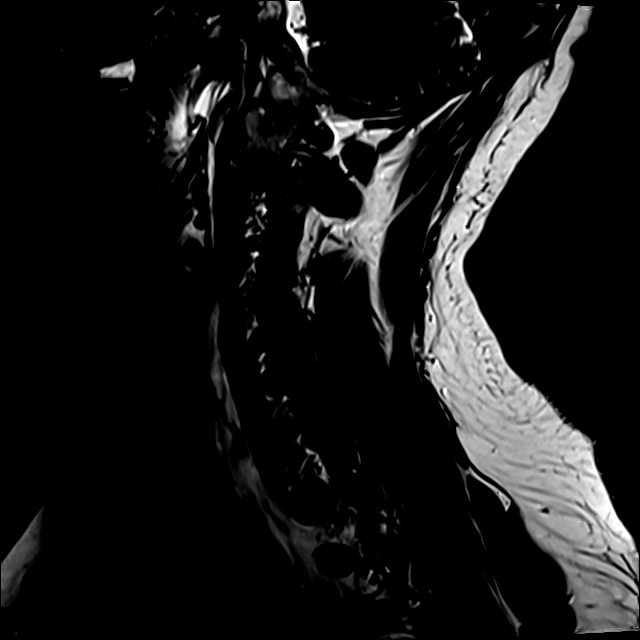

[Series 7: STIR · sagittal · 3.0mm · 0.41mm/px · 3 of 13 slices shown]
[im 3/13]
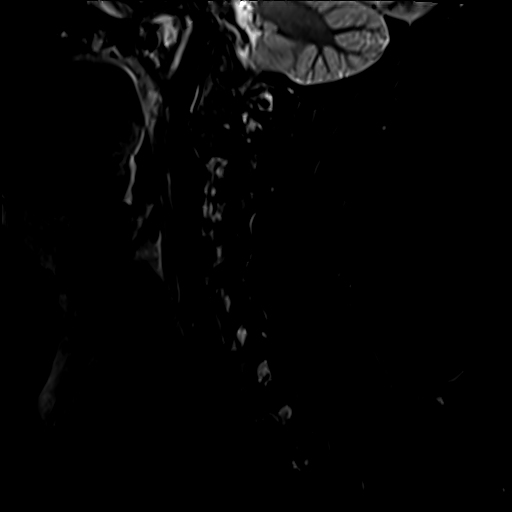
[im 8/13]
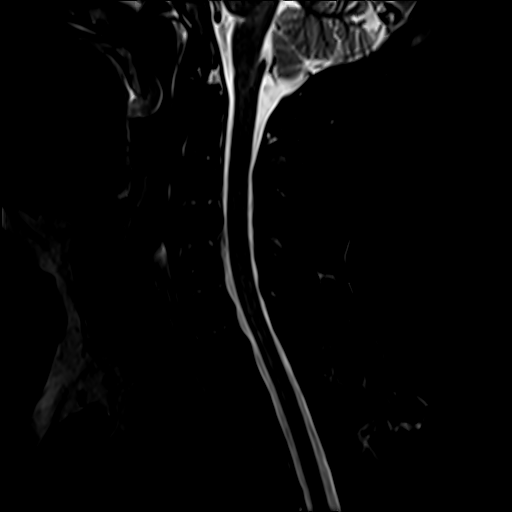
[im 13/13]
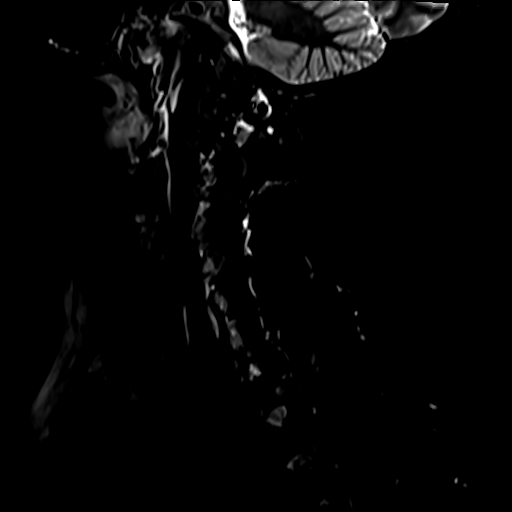

[Series 8: T2 · axial · 3.0mm · 0.31mm/px · z∈[-66,+22]mm · 4 of 30 slices shown (2 of 2)]
[im 1/30]
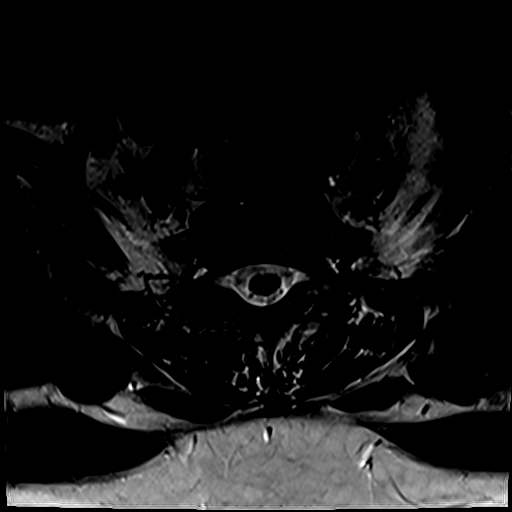
[im 5/30]
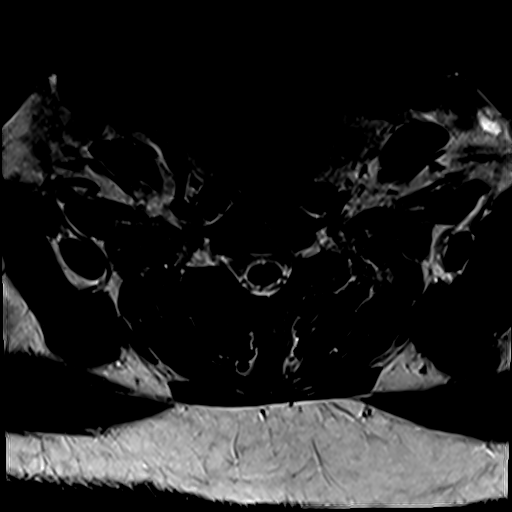
[im 15/30]
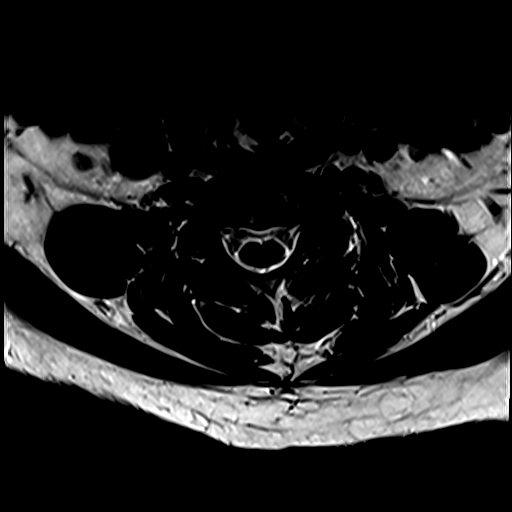
[im 25/30]
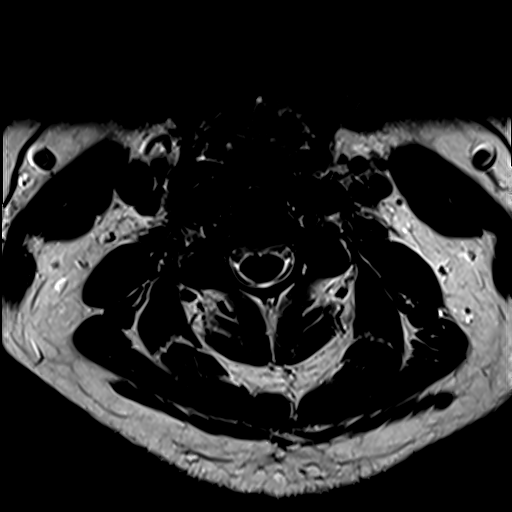

[16 of 48 positions shown; findings below may reference images not displayed]

FINDINGS: Alignment: Mild anterolisthesis C4-5. Remaining alignment normal.

Vertebrae: Normal bone marrow. Negative for fracture or mass.

Cord: Normal signal and morphology.

Posterior Fossa, vertebral arteries, paraspinal tissues: Negative

Disc levels:

C2-3: Negative

C3-4: Mild disc degeneration and spurring. No significant spinal or
foraminal stenosis

C4-5: Left-sided facet degeneration. No significant disc
degeneration. Mild left foraminal narrowing.

C5-6: Small central disc protrusion. Bilateral facet degeneration.
Mild right foraminal narrowing. Left foramen patent

C6-7: Mild disc degeneration and disc bulging. Negative for stenosis

C7-T1: Negative
IMPRESSION: 1. Mild cervical spine degenerative change. Mild left foraminal
narrowing at C4-5 due to spurring.
2. Small central disc protrusion C5-6 with mild right foraminal
narrowing.

## 2019-10-15 DIAGNOSIS — G4733 Obstructive sleep apnea (adult) (pediatric): Secondary | ICD-10-CM | POA: Diagnosis not present

## 2019-10-15 DIAGNOSIS — M5412 Radiculopathy, cervical region: Secondary | ICD-10-CM | POA: Diagnosis not present

## 2019-10-15 DIAGNOSIS — I10 Essential (primary) hypertension: Secondary | ICD-10-CM | POA: Diagnosis not present

## 2019-10-15 DIAGNOSIS — E7800 Pure hypercholesterolemia, unspecified: Secondary | ICD-10-CM | POA: Diagnosis not present

## 2019-10-30 DIAGNOSIS — G4733 Obstructive sleep apnea (adult) (pediatric): Secondary | ICD-10-CM | POA: Diagnosis not present

## 2019-12-10 DIAGNOSIS — M1711 Unilateral primary osteoarthritis, right knee: Secondary | ICD-10-CM | POA: Diagnosis not present

## 2019-12-17 DIAGNOSIS — M1711 Unilateral primary osteoarthritis, right knee: Secondary | ICD-10-CM | POA: Diagnosis not present

## 2019-12-24 DIAGNOSIS — M1711 Unilateral primary osteoarthritis, right knee: Secondary | ICD-10-CM | POA: Diagnosis not present

## 2020-01-08 DIAGNOSIS — M545 Low back pain: Secondary | ICD-10-CM | POA: Diagnosis not present

## 2020-01-15 ENCOUNTER — Ambulatory Visit: Payer: BC Managed Care – PPO | Attending: Internal Medicine

## 2020-01-15 DIAGNOSIS — Z23 Encounter for immunization: Secondary | ICD-10-CM

## 2020-01-15 NOTE — Progress Notes (Signed)
   [id number]  Name:  Darus Hershman.    MRN: [id number] DOB: 02/26/1966  01/15/2020  Mr. Hankinson was observed post Covid-19 immunization for 15 minutes without incident. He was provided with Vaccine Information Sheet and instruction to access the V-Safe system.   Mr. Zaremba was instructed to call 911 with any severe reactions post vaccine: Marland Kitchen Difficulty breathing  . Swelling of face and throat  . A fast heartbeat  . A bad rash all over body  . Dizziness and weakness   Immunizations Administered    Name Date Dose VIS Date Route   Pfizer COVID-19 Vaccine 01/15/2020  3:09 PM 0.3 mL 10/10/2019 Intramuscular   Manufacturer: [employer], [employer]   Lot: [id number]   NDC: [address]-0

## 2020-01-28 DIAGNOSIS — G4733 Obstructive sleep apnea (adult) (pediatric): Secondary | ICD-10-CM | POA: Diagnosis not present

## 2020-02-09 ENCOUNTER — Ambulatory Visit: Payer: Self-pay | Attending: Internal Medicine

## 2020-02-09 DIAGNOSIS — Z23 Encounter for immunization: Secondary | ICD-10-CM

## 2020-02-09 NOTE — Progress Notes (Signed)
   [id number]  Name:  Brandon Villanueva.    MRN: [id number] DOB: [birth year]  02/09/2020  [name] was observed post Covid-19 immunization for 15 minutes without incident. He was provided with Vaccine Information Sheet and instruction to access the V-Safe system.   Mr. Keep was instructed to call 911 with any severe reactions post vaccine: Marland Kitchen Difficulty breathing  . Swelling of face and throat  . A fast heartbeat  . A bad rash all over body  . Dizziness and weakness   Immunizations Administered    Name Date Dose VIS Date Route   Pfizer COVID-19 Vaccine 02/09/2020  3:35 PM 0.3 mL 10/10/2019 Intramuscular   Manufacturer: [employer], [employer]   Lot: [id number]   NDC: [address]-0

## 2020-04-11 IMAGING — CR DG LUMBAR SPINE 2-3V
3 series · 3 of 3 positions shown · non-contrast
Comparison: None.

CLINICAL DATA: Radiculopathy

EXAM:
LUMBAR SPINE - 2-3 VIEW

[t l-spine a.p.]
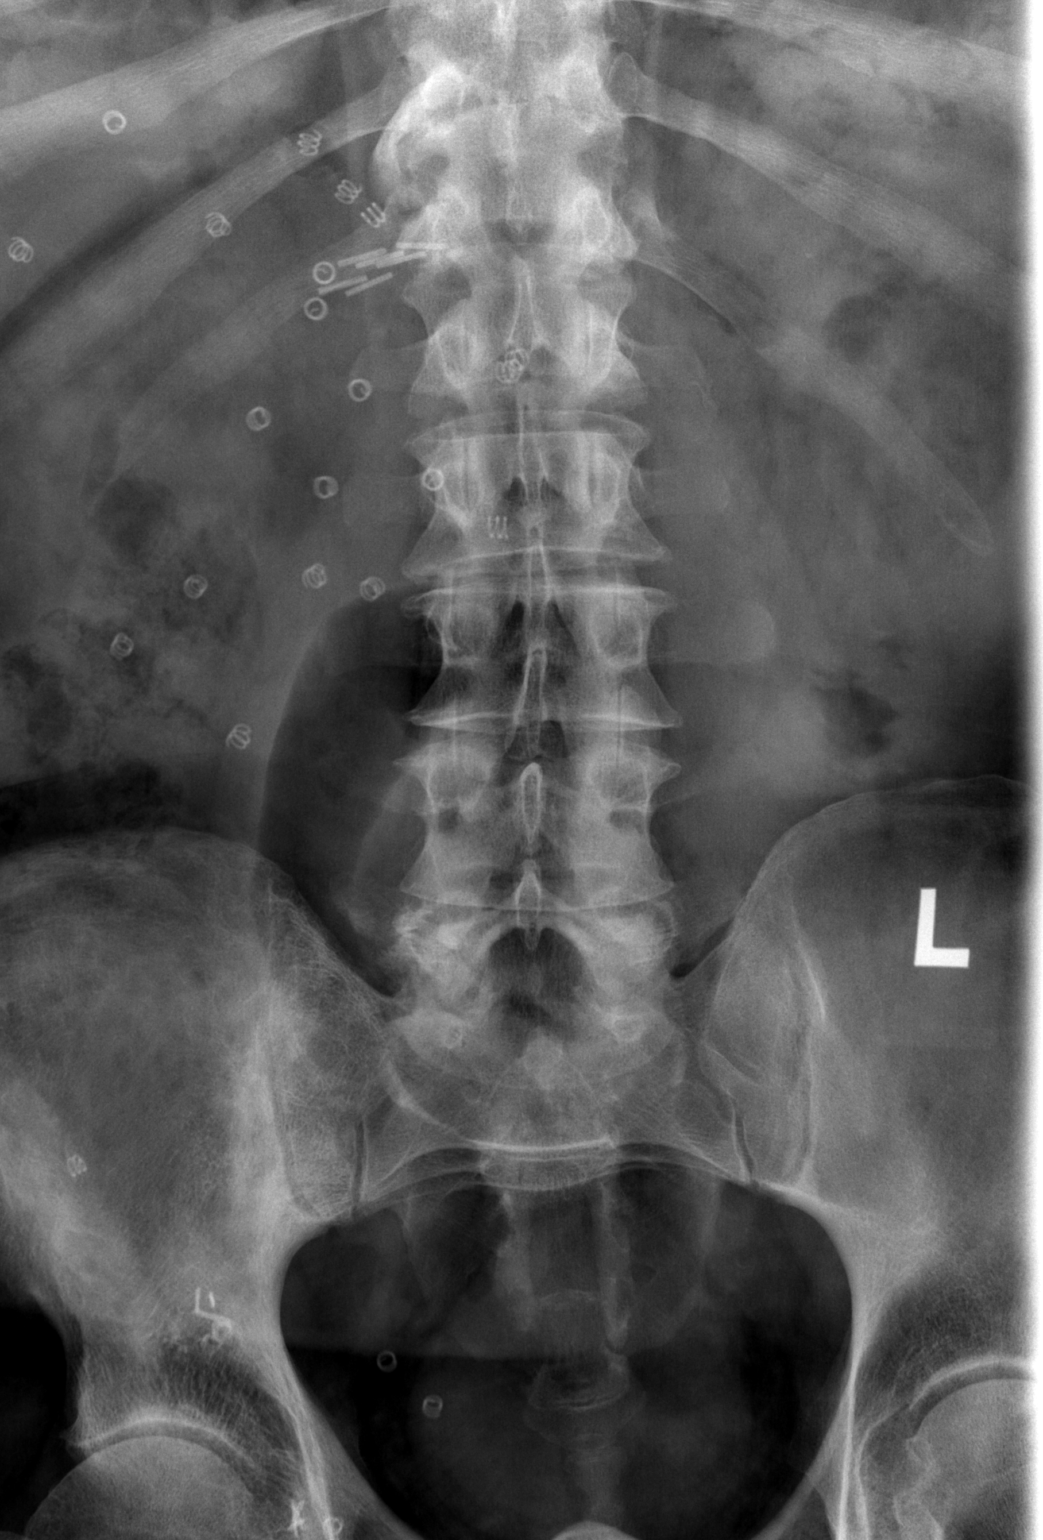

[t l-spine lat]
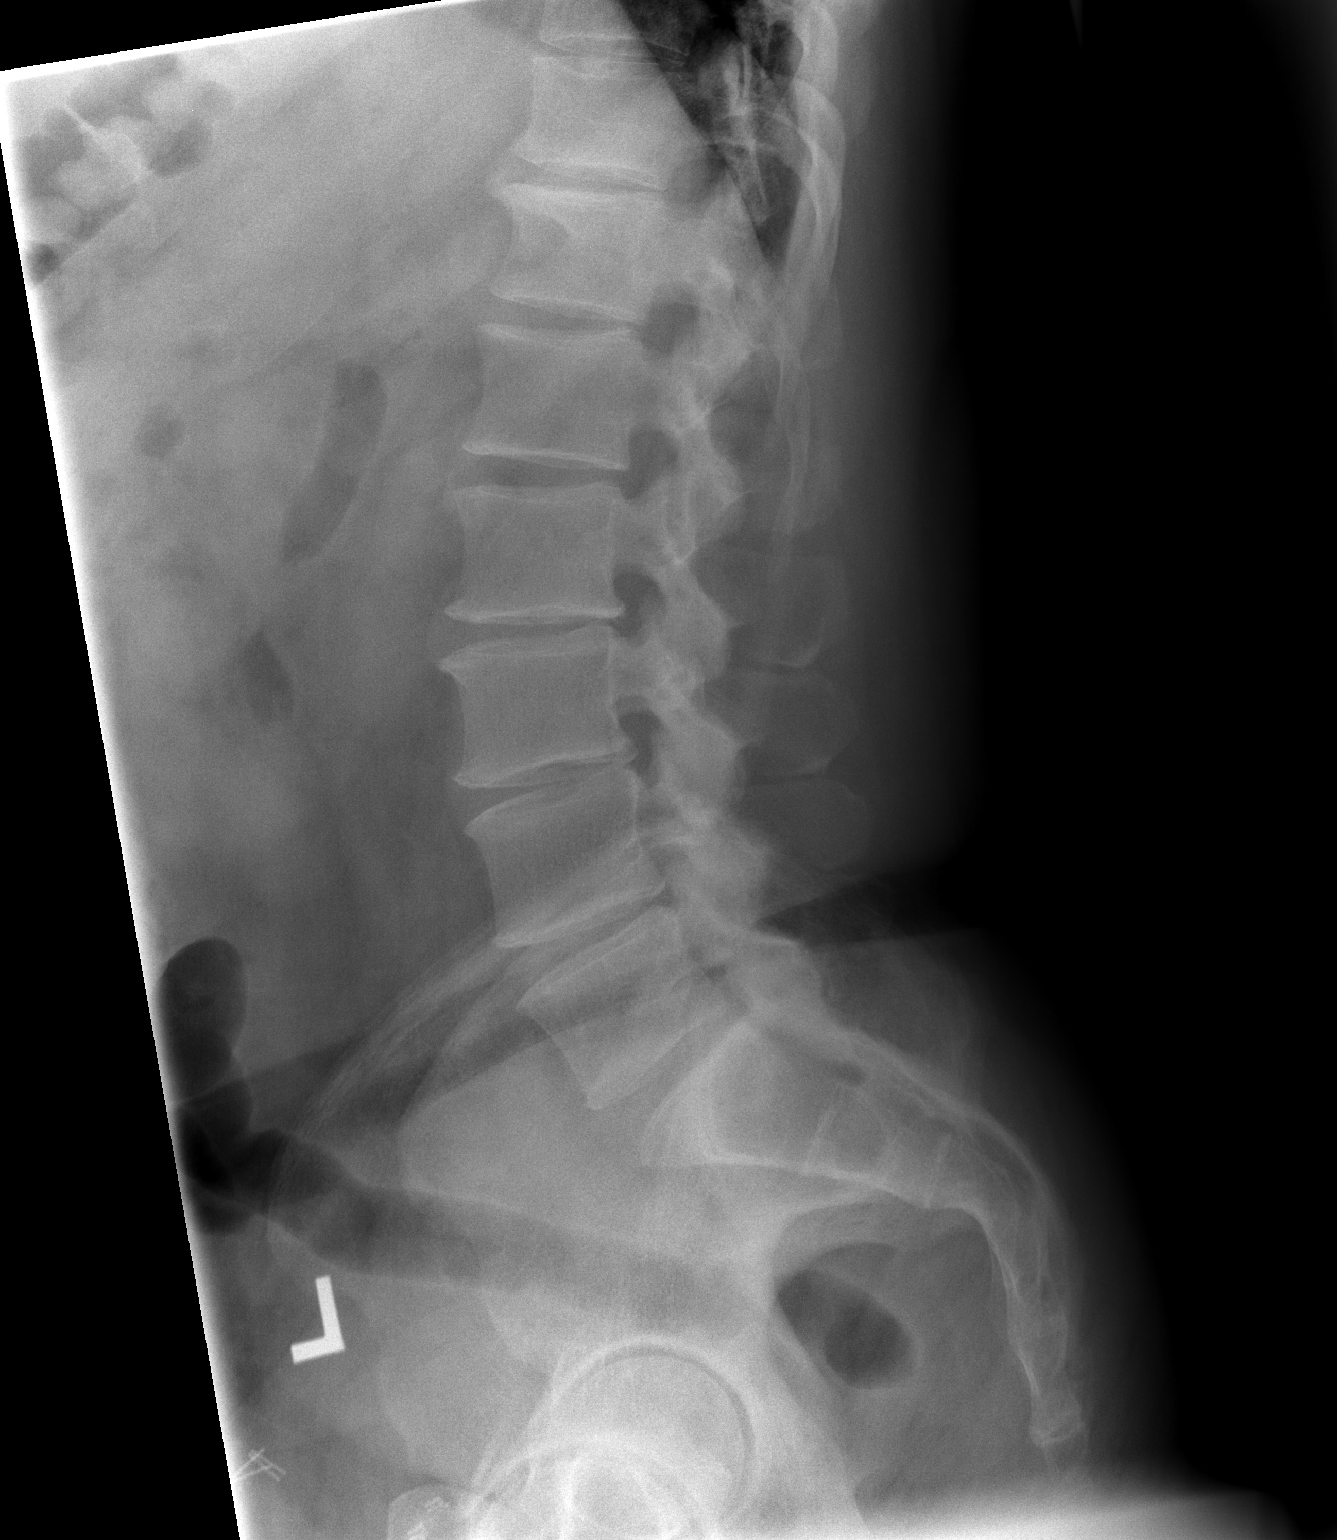

[t l-spine l5-s1 spot]
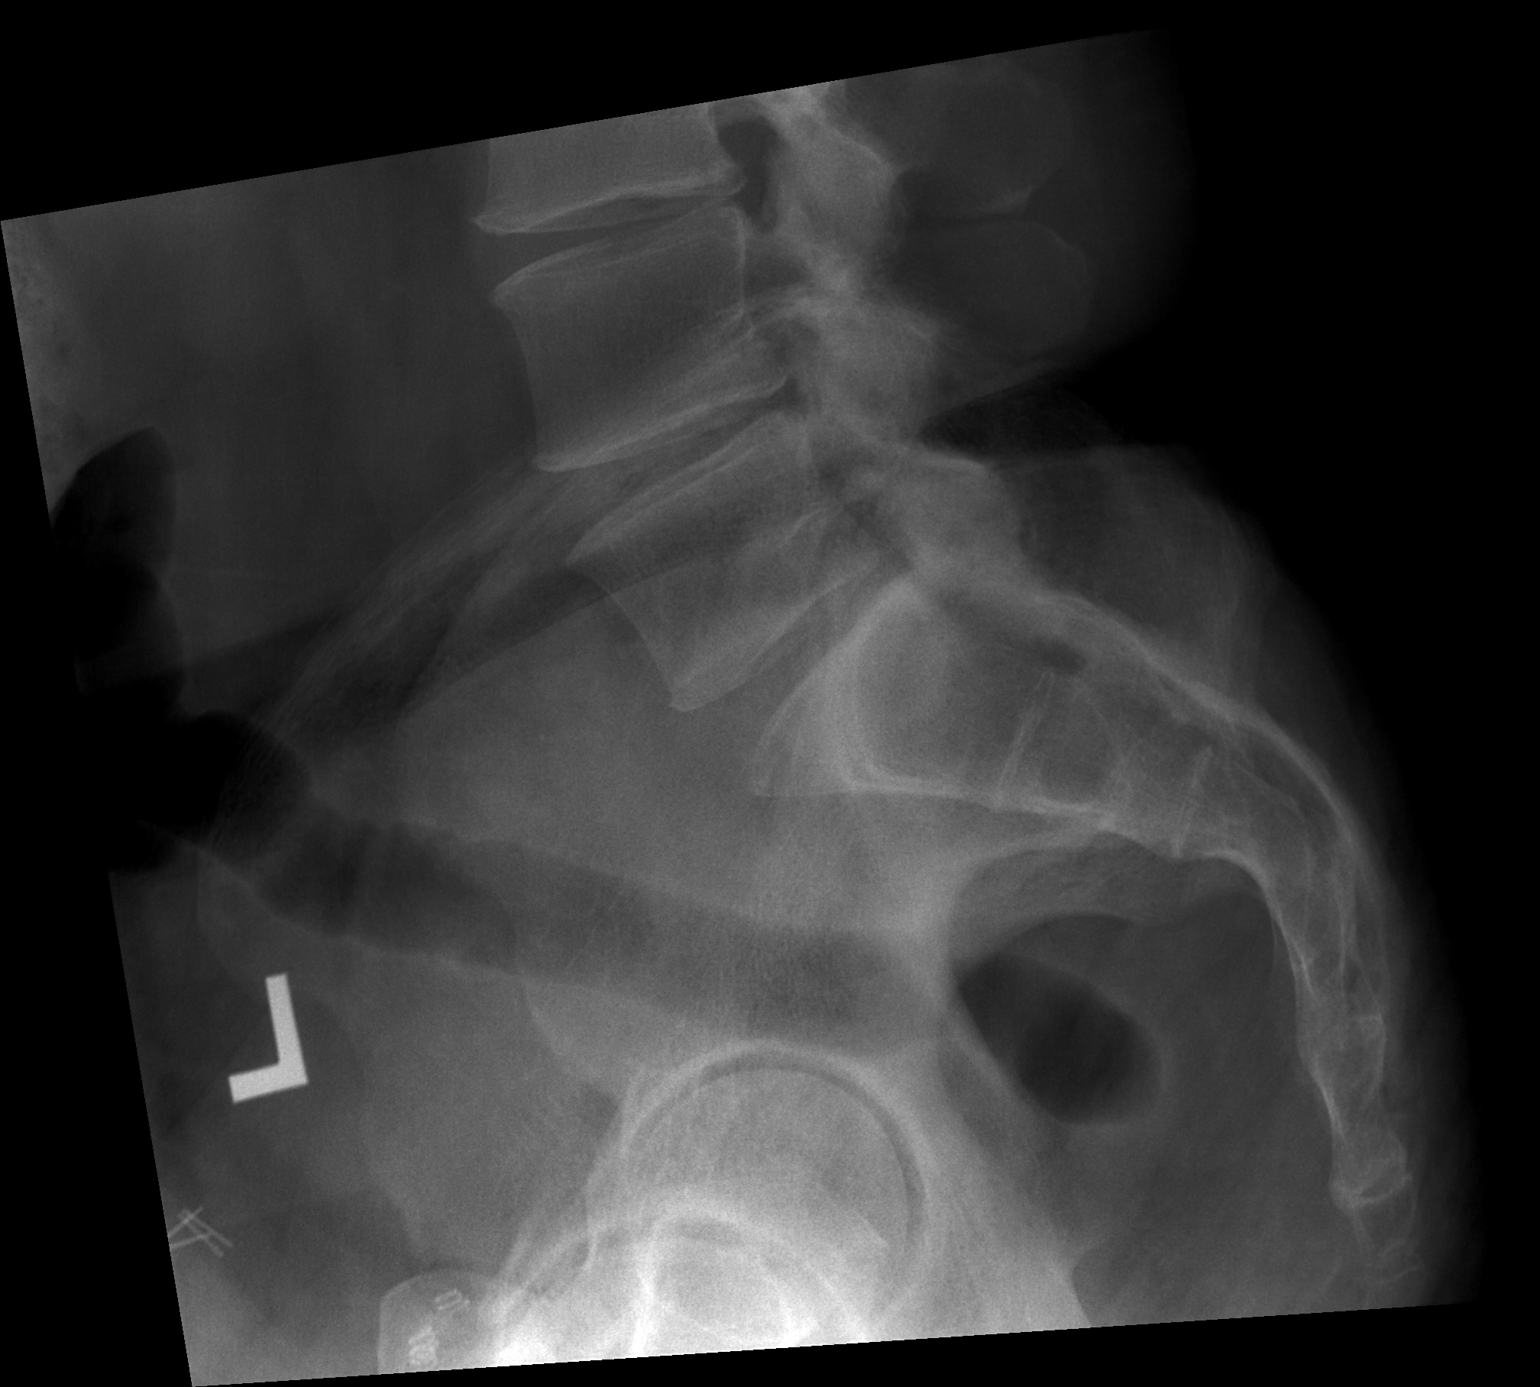

[3 of 3 positions shown; findings below may reference images not displayed]

FINDINGS: Multilevel mild degenerative disc disease with small anterior
osteophytes. Probable lower lumbar facet degenerative changes. No
fracture or malalignment.
IMPRESSION: Degenerative changes as above.

## 2020-04-16 DIAGNOSIS — Z0000 Encounter for general adult medical examination without abnormal findings: Secondary | ICD-10-CM | POA: Diagnosis not present

## 2020-04-16 DIAGNOSIS — M8949 Other hypertrophic osteoarthropathy, multiple sites: Secondary | ICD-10-CM | POA: Diagnosis not present

## 2020-04-16 DIAGNOSIS — E7800 Pure hypercholesterolemia, unspecified: Secondary | ICD-10-CM | POA: Diagnosis not present

## 2020-04-16 DIAGNOSIS — Z125 Encounter for screening for malignant neoplasm of prostate: Secondary | ICD-10-CM | POA: Diagnosis not present

## 2020-04-16 DIAGNOSIS — I10 Essential (primary) hypertension: Secondary | ICD-10-CM | POA: Diagnosis not present

## 2020-04-26 NOTE — Progress Notes (Signed)
Virtual Visit via Telephone Note   This visit type was conducted due to national recommendations for restrictions regarding the COVID-19 Pandemic (e.g. social distancing) in an effort to limit this patient's exposure and mitigate transmission in our community.  Due to his co-morbid illnesses, this patient is at least at moderate risk for complications without adequate follow up.  This format is felt to be most appropriate for this patient at this time.  The patient did not have access to video technology/had technical difficulties with video requiring transitioning to audio format only (telephone).  All issues noted in this document were discussed and addressed.  No physical exam could be performed with this format.  Please refer to the patient's chart for his  consent to telehealth for [facility].  Evaluation Performed:  Follow-up visit  This visit type was conducted due to national recommendations for restrictions regarding the COVID-19 Pandemic (e.g. social distancing).  This format is felt to be most appropriate for this patient at this time.  All issues noted in this document were discussed and addressed.  No physical exam was performed (except for noted visual exam findings with Video Visits).  Please refer to the patient's chart (MyChart message for video visits and phone note for telephone visits) for the patient's consent to telehealth for [facility].  Date:  04/26/2020   ID:  Brandon Villanueva., DOB 09/25/1966, MRN [id number]  Patient Location:  Home  Provider location:   [facility]  Cardiology Office Note:    Date:  04/27/2020   ID:  Brandon Villanueva., DOB 05-15-1966, MRN [id number]  PCP:  [name], MD  Cardiologist:  Fransico Him, MD    Referring MD: [name], MD   No chief complaint on file.   History of Present Illness:    Brandon Villanueva. is a 54 y.o. male with a hx of has undergone work-up for atypical chest pain in the past.  He has a  history of abnormal exercise tolerance test in the past.  He also has a history of hyperlipidemia, HTN and OSA, on CPAP therapy. In 2013, he had a coronary CTA with morphology which showed atherosclerotic coronary artery disease involving the left anterior descending artery with no significant stenosis.  In 2016, he underwent coronary calcium score test which showed a calcium score of 0.7.  There was  trivial focal calcification in the LAD.  Recently in the fall 2018, he underwent an exercise Myoview test, which showed positive ECG response with 2 mm horizontal ST segment depression in the lateral leads and hypertensive response to exercise.  EF was estimated at 49% with apical hypokinesis however no ischemia was noted on nuclear images.  He has been managed medically with ASA, ACE-I and statin therapy.  He is on Pravachol.    He was seen by Lyda Jester on 03/04/2018 with complaints of dizziness.  This occurred in the setting of diarrhea as well as working in a very hot environment.  He also complained of left-sided chest pain that would come on after the dizziness.  An event monitor was ordered to evaluate for arrhythmias that was normal.  The chest pain was felt to be atypical and given prior cardiac work-up including a nuclear stress test back in the fall no further testing was done.  He is now here for follow-up.  He also has a history of sleep apnea and is on CPAP.  He is here today for followup and  is doing well.  He denies any chest pain or pressure, SOB, DOE, PND, orthopnea, LE edema, dizziness, palpitations or syncope. He is compliant with his meds and is tolerating meds with no SE.  He is doing well with his CPAP device.  he tolerates the mask and feels the pressure is adequate.  Since going on CPAP he feels rested in the am and has no significant daytime sleepiness.  He denies any significant mouth or nasal dryness or nasal congestion.  He does not think that he snores.    Past Medical History:    Diagnosis Date  . Arthritis   . Benign essential HTN 02/02/2016  . Chest pain 07/07/2015  . Coronary artery calcification seen on CAT scan 02/02/2016  . Excessive daytime sleepiness 07/07/2015  . Hyperlipidemia with target LDL less than 70 02/02/2016  . Hypertension   . Morbid obesity (HCC) 07/07/2015  . OSA (obstructive sleep apnea) 09/28/2015   Severe with AHI 47/hr with successful CPAP titration to 11cm H2O  . Recurrent right inguinal hernia s/p lap repair with mesh 06/09/14 06/01/2014  . Snoring 07/07/2015    Past Surgical History:  Procedure Laterality Date  . INGUINAL HERNIA REPAIR Right 2010  . VENTRAL HERNIA REPAIR     Repaired twice by Dr. Luretha Murphy    Current Medications: Current Meds  Medication Sig  . aspirin 81 MG tablet Take 81 mg by mouth daily.  . meloxicam (MOBIC) 15 MG tablet TAKE 1 TABLET BY MOUTH EVERY DAY  . OVER THE COUNTER MEDICATION Deep blue polyphenel complex  . OVER THE COUNTER MEDICATION xeo mega essential oil  . OVER THE COUNTER MEDICATION Micro plex vmx  . OVER THE COUNTER MEDICATION Alpha crst cellular vitality complex  . perindopril (ACEON) 4 MG tablet Take 4 mg by mouth daily.  . pravastatin (PRAVACHOL) 80 MG tablet TAKE 1 TABLET BY MOUTH EVERY EVENING  . tiZANidine (ZANAFLEX) 2 MG tablet tizanidine 2 mg tablet  TAKE 1/2-2 TABLETS AT BEDTIME AS NEEDED FOR MUSCLE SPASMS  . traMADol (ULTRAM) 50 MG tablet Take 50 mg by mouth every 6 (six) hours as needed for moderate pain. Reported on 02/02/2016     Allergies:   Lipitor [atorvastatin]   Social History   Socioeconomic History  . Marital status: Married    Spouse name: Not on file  . Number of children: Not on file  . Years of education: Not on file  . Highest education level: Not on file  Occupational History  . Not on file  Tobacco Use  . Smoking status: Never Smoker  . Smokeless tobacco: Never Used  Vaping Use  . Vaping Use: Never used  Substance and Sexual Activity  . Alcohol use: No  .  Drug use: No  . Sexual activity: Not on file  Other Topics Concern  . Not on file  Social History Narrative  . Not on file   Social Determinants of Health   Financial Resource Strain:   . Difficulty of Paying Living Expenses:   Food Insecurity:   . Worried About Programme researcher, broadcasting/film/video in the Last Year:   . Barista in the Last Year:   Transportation Needs:   . Freight forwarder (Medical):   Marland Kitchen Lack of Transportation (Non-Medical):   Physical Activity:   . Days of Exercise per Week:   . Minutes of Exercise per Session:   Stress:   . Feeling of Stress :   Social Connections:   .  Frequency of Communication with Friends and Family:   . Frequency of Social Gatherings with Friends and Family:   . Attends Religious Services:   . Active Member of Clubs or Organizations:   . Attends Banker Meetings:   Marland Kitchen Marital Status:      Family History: The patient's family history includes Coronary artery disease in his paternal grandmother; Coronary artery disease (age of onset: 42) in his father; Diabetes in his paternal grandfather; Heart attack in his paternal grandmother; Hyperlipidemia in his father.  ROS:   Please see the history of present illness.    ROS  All other systems reviewed and negative.   EKGs/Labs/Other Studies Reviewed:    The following studies were reviewed today: PAP compliance download  EKG:  EKG is not ordered today.    Recent Labs: No results found for requested labs within last 8760 hours.   Recent Lipid Panel    Component Value Date/Time   CHOL 139 07/24/2018 0723   TRIG 51 07/24/2018 0723   HDL 49 07/24/2018 0723   CHOLHDL 2.8 07/24/2018 0723   CHOLHDL 3.2 03/29/2016 0750   VLDL 18 03/29/2016 0750   LDLCALC 80 07/24/2018 0723    Physical Exam:    VS:  Ht 5\' 8"  (1.727 m)   Wt 220 lb (99.8 kg)   BMI 33.45 kg/m     Wt Readings from Last 3 Encounters:  04/27/20 220 lb (99.8 kg)  04/24/18 230 lb 12.8 oz (104.7 kg)  03/04/18  233 lb 12.8 oz (106.1 kg)     ASSESSMENT:    1. OSA (obstructive sleep apnea)   2. Coronary artery calcification seen on CAT scan   3. Benign essential HTN   4. Obesity (BMI 30-39.9)   5. Hyperlipidemia with target LDL less than 70    PLAN:    In order of problems listed above:  1.  OSA -  The patient is tolerating PAP therapy well without any problems. The PAP download was reviewed today and showed an AHI of 1.3/hr on 11 cm H2O with 100% compliance in using more than 4 hours nightly.  The patient has been using and benefiting from PAP use and will continue to benefit from therapy.   2.  Coronary artery calcifications on Chest CT  -coronary calcium score showed a calcium score of 0.7 with trivial focal calcification in the LAD.   - 2018 exercise Myoview tes with  positive ECG response with 2 mm horizontal ST segment depression in the lateral leads and hypertensive response to exercise.  EF was estimated at 49% with apical hypokinesis however no ischemia was noted on nuclear images. -he has not had any further CP -Continue on statin therapy and ASA 81 mg daily.  3.  Hypertension  -BP controlled -continue Perindopril 4mg  daily  4.  Obesity -I have encouraged him to get into a routine exercise program and cut back on carbs and portions.   5.  Hyperlipidemia  -LDL goal is less than 70.   -I will get a copy fo last FLP from PCP -continue Pravachol 40mg  daily  COVID-19 Education: He has been vaccinated  Patient Risk:   After full review of this patient's clinical status, I feel that they are at least moderate risk at this time.  Time:   Today, I have spent 20 minutes on telemedicine discussing medical problems including OSA, Coronary calcium, HLD, HTN, Obesity and reviewing patient's chart including PAP compliance download.    Medication Adjustments/Labs and  Tests Ordered: Current medicines are reviewed at length with the patient today.  Concerns regarding medicines are  outlined above.  No orders of the defined types were placed in this encounter.  No orders of the defined types were placed in this encounter.   Signed, Armanda Magic, MD  04/27/2020 8:04 AM    [facility]

## 2020-04-27 ENCOUNTER — Other Ambulatory Visit: Payer: Self-pay

## 2020-04-27 ENCOUNTER — Telehealth (INDEPENDENT_AMBULATORY_CARE_PROVIDER_SITE_OTHER): Payer: BC Managed Care – PPO | Admitting: Cardiology

## 2020-04-27 ENCOUNTER — Encounter: Payer: Self-pay | Admitting: Cardiology

## 2020-04-27 VITALS — Ht 68.0 in | Wt 220.0 lb

## 2020-04-27 DIAGNOSIS — I2510 Atherosclerotic heart disease of native coronary artery without angina pectoris: Secondary | ICD-10-CM | POA: Diagnosis not present

## 2020-04-27 DIAGNOSIS — E785 Hyperlipidemia, unspecified: Secondary | ICD-10-CM

## 2020-04-27 DIAGNOSIS — G4733 Obstructive sleep apnea (adult) (pediatric): Secondary | ICD-10-CM

## 2020-04-27 DIAGNOSIS — E669 Obesity, unspecified: Secondary | ICD-10-CM | POA: Diagnosis not present

## 2020-04-27 DIAGNOSIS — I10 Essential (primary) hypertension: Secondary | ICD-10-CM

## 2020-04-27 NOTE — Patient Instructions (Signed)

## 2020-06-18 ENCOUNTER — Other Ambulatory Visit: Payer: Self-pay

## 2020-06-18 ENCOUNTER — Ambulatory Visit: Payer: BC Managed Care – PPO | Admitting: Podiatry

## 2020-06-18 ENCOUNTER — Ambulatory Visit (INDEPENDENT_AMBULATORY_CARE_PROVIDER_SITE_OTHER): Payer: BC Managed Care – PPO

## 2020-06-18 ENCOUNTER — Encounter: Payer: Self-pay | Admitting: Podiatry

## 2020-06-18 VITALS — Temp 97.0°F

## 2020-06-18 DIAGNOSIS — M7732 Calcaneal spur, left foot: Secondary | ICD-10-CM | POA: Diagnosis not present

## 2020-06-18 DIAGNOSIS — M76829 Posterior tibial tendinitis, unspecified leg: Secondary | ICD-10-CM

## 2020-06-18 DIAGNOSIS — M19072 Primary osteoarthritis, left ankle and foot: Secondary | ICD-10-CM

## 2020-06-18 DIAGNOSIS — M2142 Flat foot [pes planus] (acquired), left foot: Secondary | ICD-10-CM

## 2020-06-18 DIAGNOSIS — M2141 Flat foot [pes planus] (acquired), right foot: Secondary | ICD-10-CM | POA: Diagnosis not present

## 2020-06-18 IMAGING — DX DG FOOT COMPLETE 3+V*L*
3 series · 3 of 3 positions shown · non-contrast
Comparison: [DATE]

CLINICAL DATA: Medial left foot pain for 3 weeks

EXAM:
LEFT FOOT - COMPLETE 3+ VIEW

[foot ap]
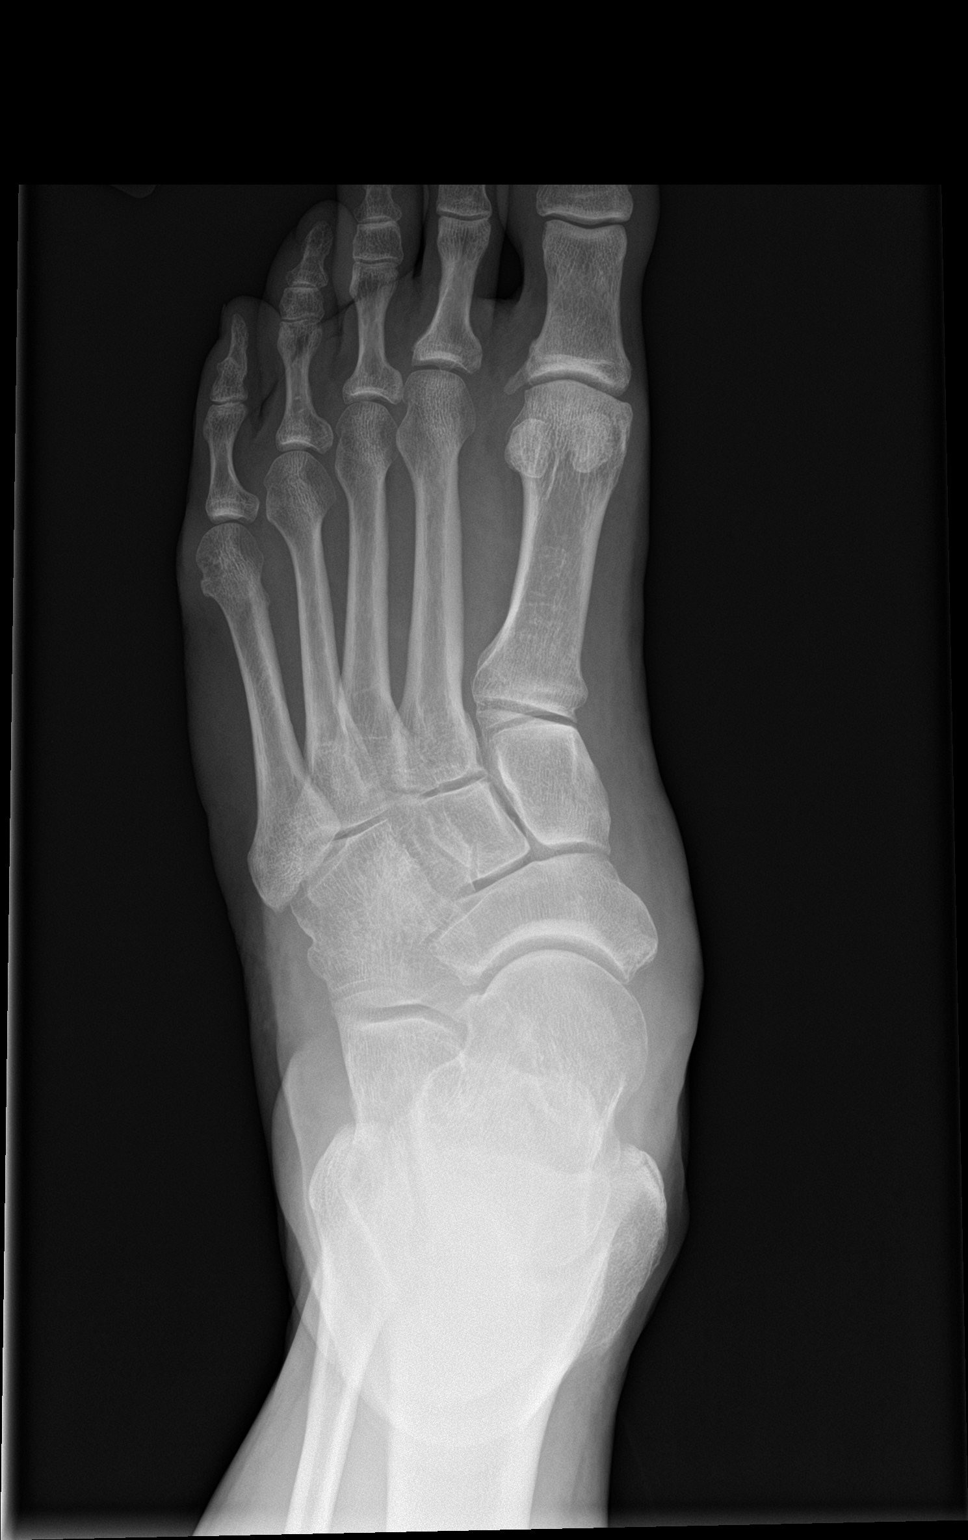

[foot obl]
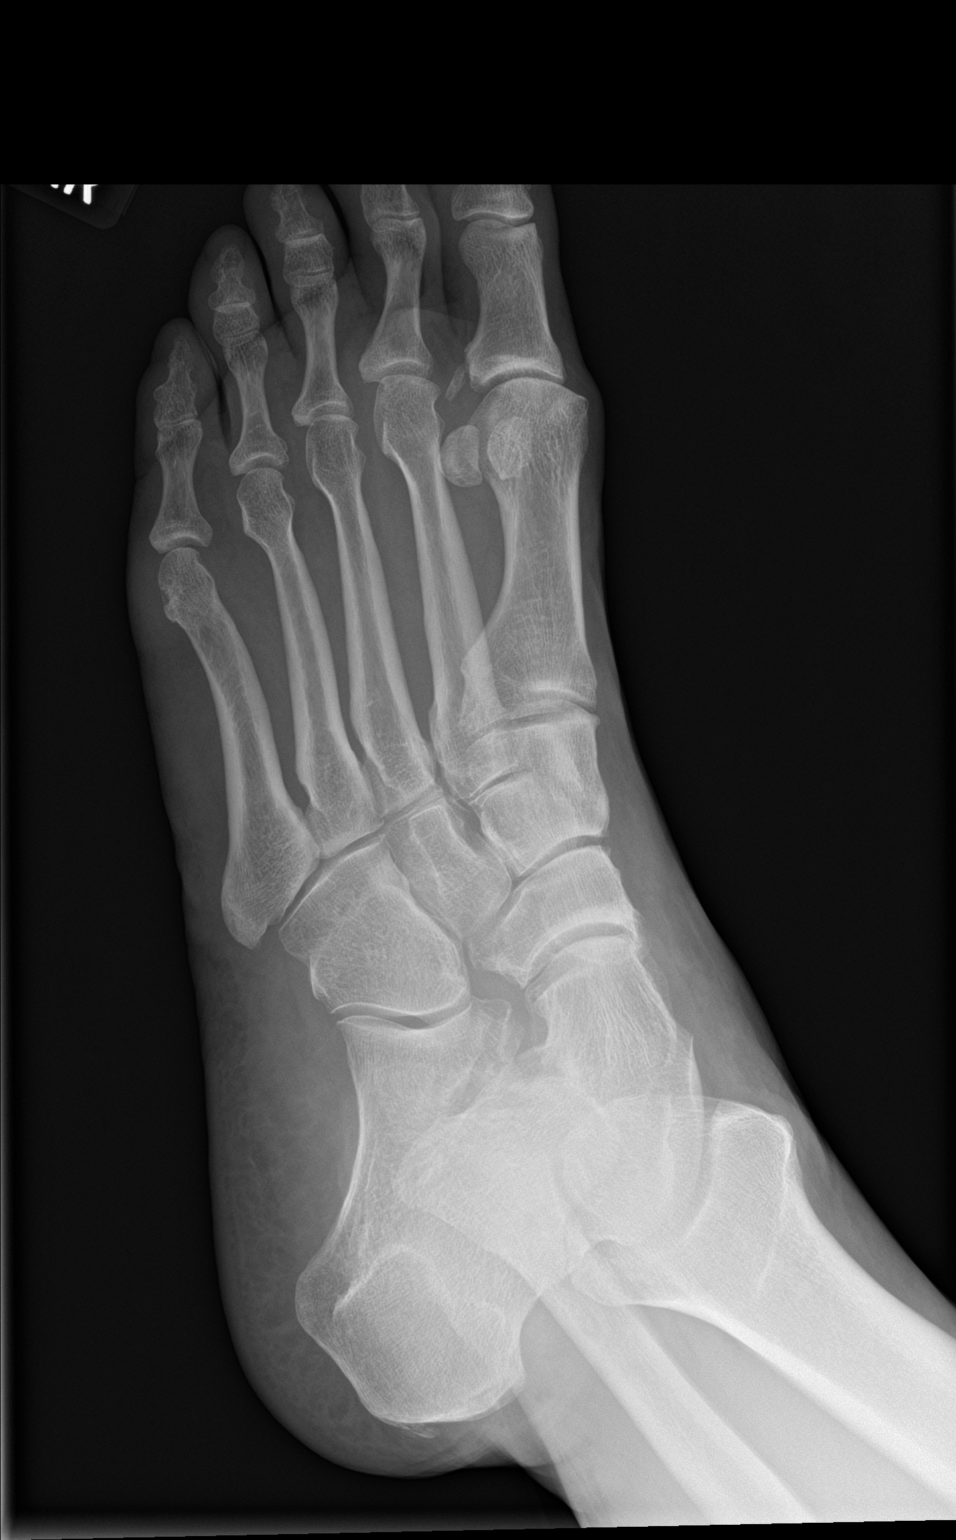

[foot lat]
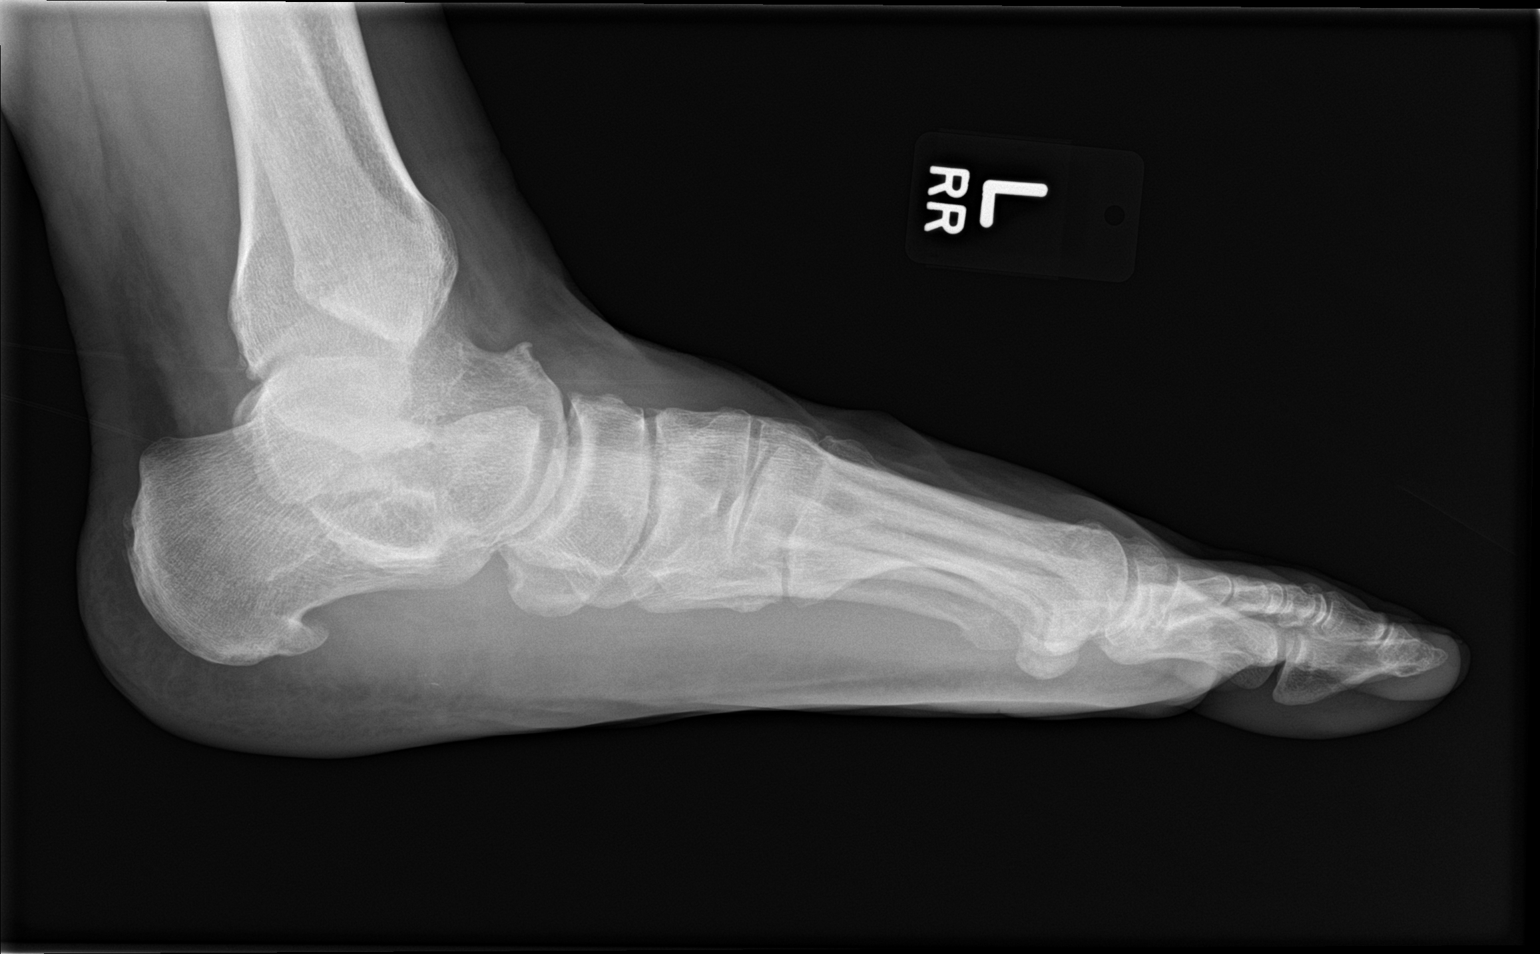

[3 of 3 positions shown; findings below may reference images not displayed]

FINDINGS: Frontal, oblique, and lateral views of the left foot are obtained.
No fracture, subluxation, or dislocation. Stable osteoarthritis at
the first metatarsophalangeal joint. Prominent inferior calcaneal
spur. Soft tissues are normal.
IMPRESSION: 1. Mild osteoarthritis.  No acute bony abnormality.

## 2020-06-18 MED ORDER — METHYLPREDNISOLONE 4 MG PO TBPK
ORAL_TABLET | ORAL | 0 refills | Status: DC
Start: 2020-06-18 — End: 2021-05-28

## 2020-06-20 NOTE — Progress Notes (Signed)
Subjective: 54 year old male presents the office today for concerns of pain to the medial aspect of his left foot pointing along the talonavicular joint area.  He states that once in a while he'll get a sharp pain into the foot and feels sensitive.  No injuries.  He has noticed increasing swelling. Denies any systemic complaints such as fevers, chills, nausea, vomiting. No acute changes since last appointment, and no other complaints at this time.   Objective: AAO x3, NAD DP/PT pulses palpable bilaterally, CRT less than 3 seconds Forefoot is present with left side worse than right.  There is tenderness on the course the posterior tibial tendon near the insertion into the navicular.  There is edema to this area with is no erythema or warmth.  He is able to do a single heel rise on the left side although his discomfort the heel does invert.  No pain with calf compression, swelling, warmth, erythema  Assessment: 54 year old male posterior tibial tendon dysfunction, arthritis  Plan: -All treatment options discussed with the patient including all alternatives, risks, complications.  -Given the discomfort as well as swelling I recommend immobilization in a cam boot was dispensed today.  Prescribed a Medrol Dosepak.  Ice to the area daily limit activity.  If no improvement will order an MRI. -Patient encouraged to call the office with any questions, concerns, change in symptoms.   Vivi Barrack DPM

## 2020-07-15 ENCOUNTER — Ambulatory Visit: Payer: BC Managed Care – PPO | Admitting: Podiatry

## 2020-07-15 ENCOUNTER — Other Ambulatory Visit: Payer: Self-pay

## 2020-07-15 DIAGNOSIS — M76829 Posterior tibial tendinitis, unspecified leg: Secondary | ICD-10-CM | POA: Diagnosis not present

## 2020-07-15 DIAGNOSIS — M2142 Flat foot [pes planus] (acquired), left foot: Secondary | ICD-10-CM | POA: Diagnosis not present

## 2020-07-15 DIAGNOSIS — M2141 Flat foot [pes planus] (acquired), right foot: Secondary | ICD-10-CM

## 2020-07-15 DIAGNOSIS — M19072 Primary osteoarthritis, left ankle and foot: Secondary | ICD-10-CM

## 2020-07-15 DIAGNOSIS — S96912D Strain of unspecified muscle and tendon at ankle and foot level, left foot, subsequent encounter: Secondary | ICD-10-CM

## 2020-07-18 NOTE — Progress Notes (Signed)
Subjective: 54 year old male presents the office today for follow-up evaluation of left foot, ankle pain.  He states that overall he is doing better but he still having pain through the medial aspect the foot.  Particularly when he drives home from work he gets out of the car it hurts more.  He denies any recent injury or trauma.  Occasional swelling.  No redness or warmth. Denies any systemic complaints such as fevers, chills, nausea, vomiting. No acute changes since last appointment, and no other complaints at this time.   Objective: AAO x3, NAD DP/PT pulses palpable bilaterally, CRT less than 3 seconds Significant flattening of the left foot compared to contra extremity and there is tenderness on the distal portion of the posterior tibial tendon and along the insertion at the navicular tuberosity.  No area of pinpoint tenderness identified today.  MMT 5/5. No pain with calf compression, swelling, warmth, erythema  Assessment: Posterior tibial tendon dysfunction, rule out tear  Plan: -All treatment options discussed with the patient including all alternatives, risks, complications.  -At this point I recommend MRI to further evaluate the integrity of the posterior tibial tendon to rule out tearing or partial tearing of the tendon.  This was ordered today.  For now continue ankle brace, supportive shoes.  He can use anti-inflammatories as needed. -Patient encouraged to call the office with any questions, concerns, change in symptoms.   Vivi Barrack DPM

## 2020-07-20 ENCOUNTER — Other Ambulatory Visit: Payer: Self-pay

## 2020-07-20 ENCOUNTER — Ambulatory Visit: Payer: BC Managed Care – PPO | Admitting: Orthotics

## 2020-07-20 DIAGNOSIS — M19072 Primary osteoarthritis, left ankle and foot: Secondary | ICD-10-CM

## 2020-07-20 DIAGNOSIS — M2141 Flat foot [pes planus] (acquired), right foot: Secondary | ICD-10-CM

## 2020-07-20 DIAGNOSIS — M76829 Posterior tibial tendinitis, unspecified leg: Secondary | ICD-10-CM

## 2020-07-20 NOTE — Progress Notes (Signed)
Adjusted f/o by adding varus RF wedge and navicular padding.

## 2020-07-21 ENCOUNTER — Telehealth: Payer: Self-pay | Admitting: *Deleted

## 2020-07-21 NOTE — Telephone Encounter (Signed)
Patient has an appointment on 07-24-20 at 11:30 at [facility] imaging. Brandon Villanueva

## 2020-07-21 NOTE — Telephone Encounter (Signed)
-----   Message from Vivi Barrack, DPM sent at 07/18/2020  9:14 AM EDT ----- I have ordered an MRI of the left ankle if you could please follow up on this one. Thanks.

## 2020-07-23 ENCOUNTER — Other Ambulatory Visit: Payer: BC Managed Care – PPO | Admitting: Orthotics

## 2020-07-24 ENCOUNTER — Ambulatory Visit
Admission: RE | Admit: 2020-07-24 | Discharge: 2020-07-24 | Disposition: A | Discharge status: Home / Self Care | Payer: BC Managed Care – PPO | Source: Ambulatory Visit | Attending: Podiatry | Admitting: Podiatry

## 2020-07-24 DIAGNOSIS — M76829 Posterior tibial tendinitis, unspecified leg: Secondary | ICD-10-CM

## 2020-07-24 DIAGNOSIS — S96912D Strain of unspecified muscle and tendon at ankle and foot level, left foot, subsequent encounter: Secondary | ICD-10-CM

## 2020-07-24 DIAGNOSIS — M19072 Primary osteoarthritis, left ankle and foot: Secondary | ICD-10-CM | POA: Diagnosis not present

## 2020-07-24 DIAGNOSIS — M65872 Other synovitis and tenosynovitis, left ankle and foot: Secondary | ICD-10-CM | POA: Diagnosis not present

## 2020-07-24 DIAGNOSIS — R600 Localized edema: Secondary | ICD-10-CM | POA: Diagnosis not present

## 2020-07-24 IMAGING — MR MR ANKLE*L* W/O CM
5 series · 40 of 40 positions shown · non-contrast
Comparison: Radiographs [DATE]

CLINICAL DATA: Pain and swelling medially just below the ankle
joint.

EXAM:
MRI OF THE LEFT ANKLE WITHOUT CONTRAST
TECHNIQUE: Multiplanar, multisequence MR imaging of the ankle was performed. No
intravenous contrast was administered.

[Series 4: T2 fat-sat · axial · 3.0mm · 0.56mm/px · z∈[-71,+68]mm · 8 of 36 slices shown (1 of 2)]
[im 1/36]
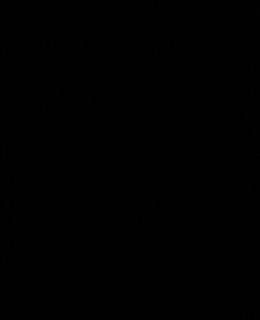
[im 6/36]
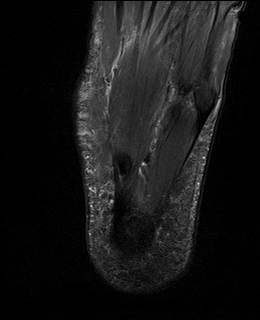
[im 11/36]
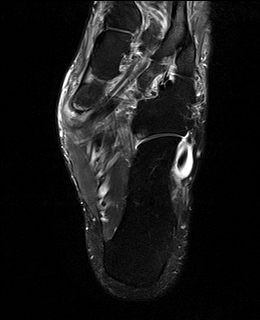
[im 16/36]
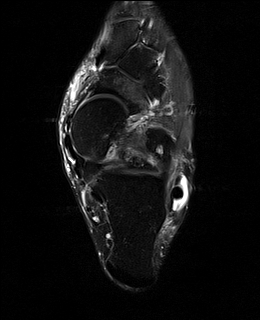
[im 21/36]
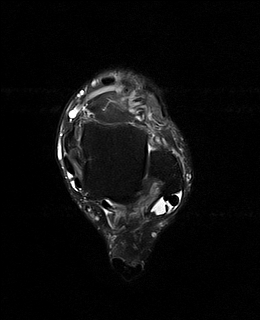
[im 26/36]
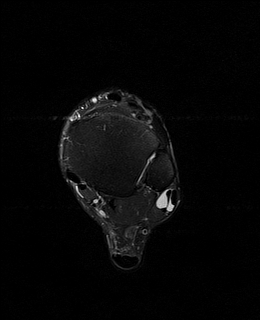
[im 31/36]
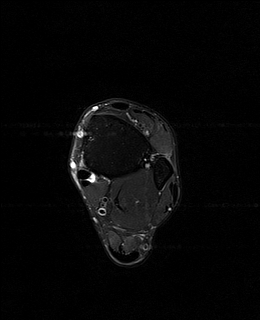
[im 36/36]
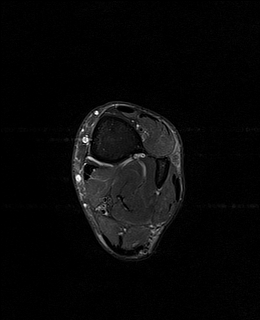

[Series 5: PD fat-sat · axial · 3.5mm · 0.56mm/px · z∈[-71,+69]mm · 9 of 33 slices shown]
[im 1/33]
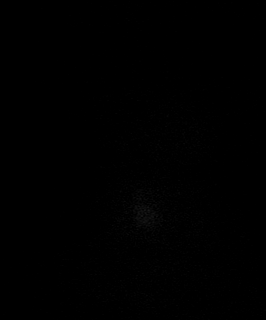
[im 5/33]
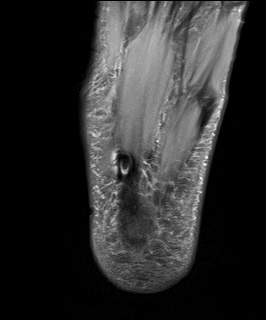
[im 9/33]
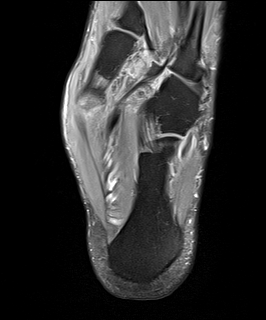
[im 13/33]
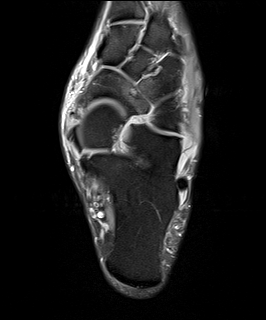
[im 17/33]
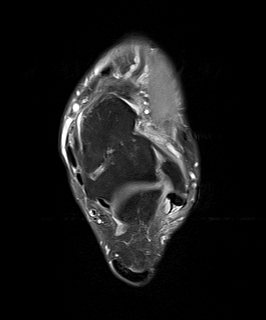
[im 21/33]
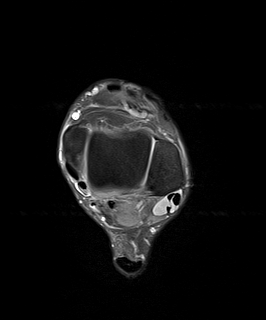
[im 25/33]
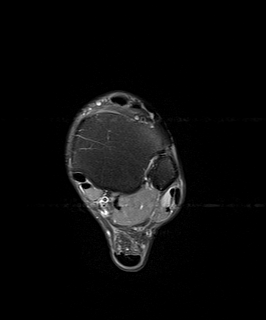
[im 29/33]
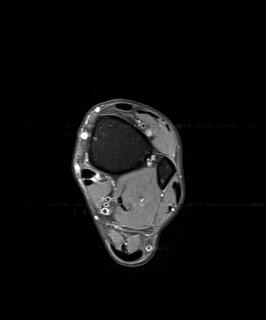
[im 33/33]
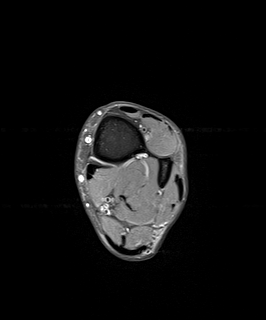

[Series 6: T2 fat-sat · coronal · 3.0mm · 0.66mm/px · 11 of 41 slices shown (2 of 2)]
[im 1/41]
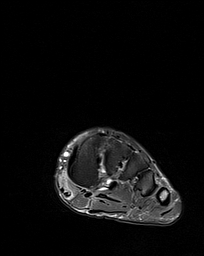
[im 5/41]
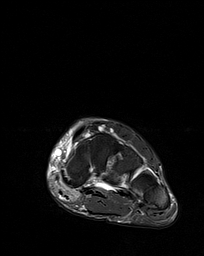
[im 9/41]
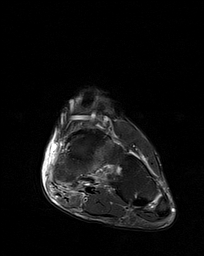
[im 13/41]
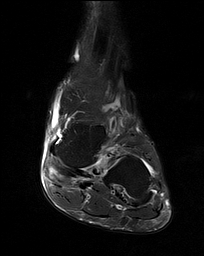
[im 17/41]
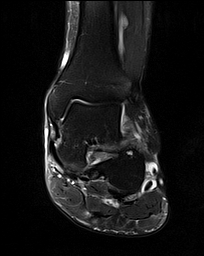
[im 21/41]
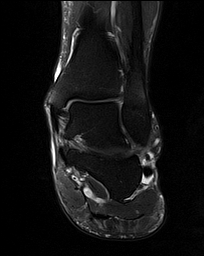
[im 25/41]
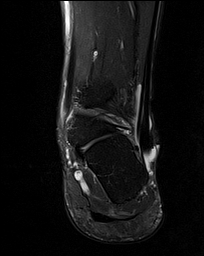
[im 29/41]
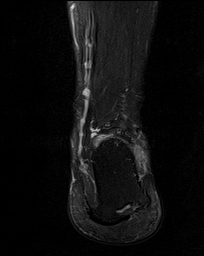
[im 33/41]
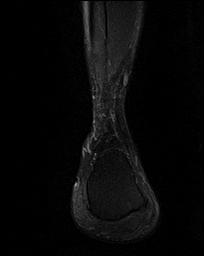
[im 37/41]
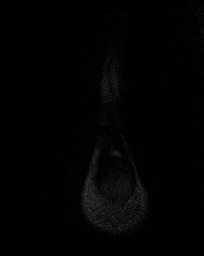
[im 41/41]
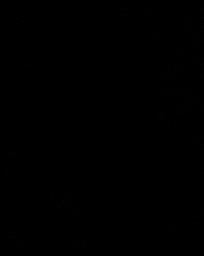

[Series 7: T1 · sagittal · 4.0mm · 0.56mm/px · 6 of 22 slices shown]
[im 1/22]
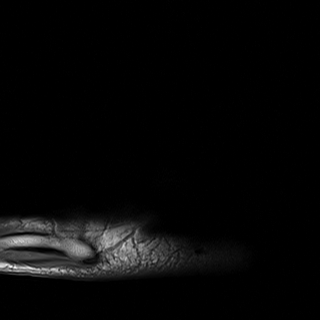
[im 5/22]
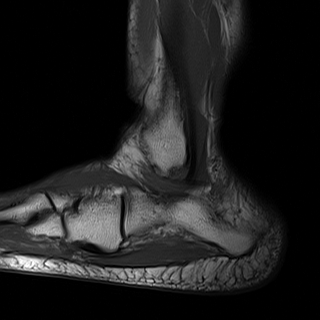
[im 9/22]
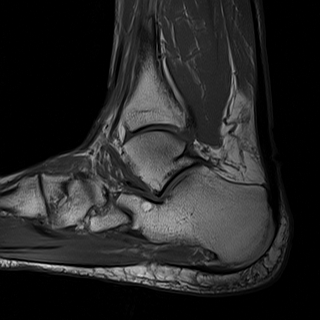
[im 13/22]
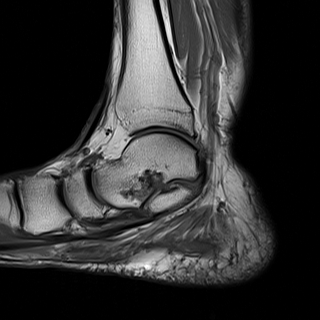
[im 17/22]
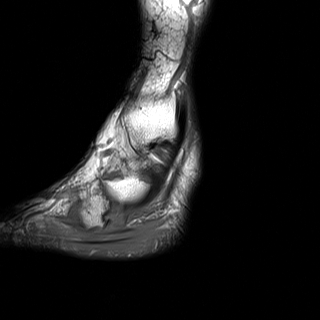
[im 22/22]
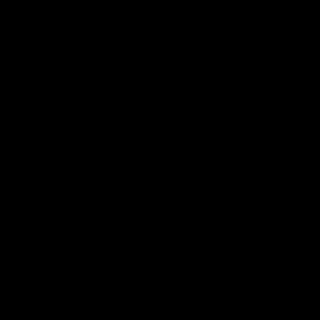

[Series 8: STIR · sagittal · 4.0mm · 0.70mm/px · 6 of 22 slices shown]
[im 1/22]
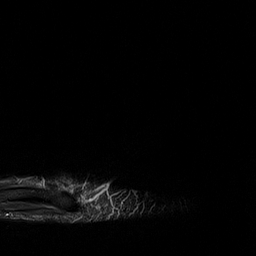
[im 5/22]
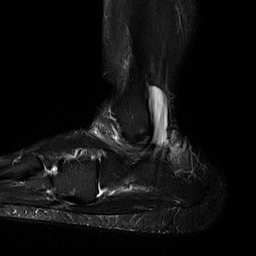
[im 9/22]
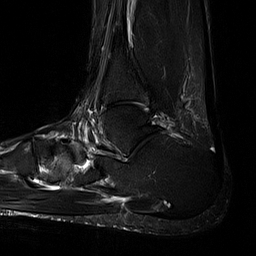
[im 13/22]
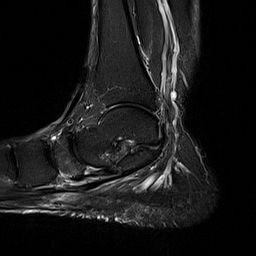
[im 17/22]
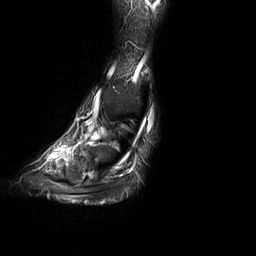
[im 22/22]
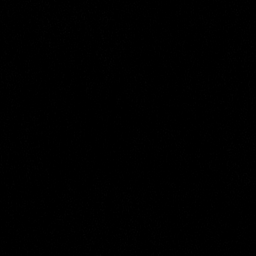

[40 of 40 positions shown; findings below may reference images not displayed]

FINDINGS: TENDONS

Peroneal: Intact. Significant tenosynovitis involving both tendons.
No significant tendinopathy or tendon tear/rupture.

Posteromedial: Intact. Mild tenosynovitis. Mild distal posterior
tibialis tendon tendinopathy with small interstitial tears. No
complete tear/rupture.

Anterior: Intact.  No significant tendinopathy or tenosynovitis.

Achilles: Normal

Plantar Fascia: Intact. Small calcaneal heel spur noted. Small focus
of artifact noted in the region of the central slip and flexor
digitorum brevis muscle. Possible foreign body or postoperative
change.

LIGAMENTS

Lateral: Intact

Medial: Intact

CARTILAGE

Ankle Joint: Minimal degenerative changes. The articular cartilage
is intact. No joint effusion. No osteochondral lesion.

Subtalar Joints/Sinus Tarsi: Mild subtalar joint degenerative
changes. Signal abnormality in the sinus tarsi. The sinus tarsi is
narrowed due to pes planus and fairly significant hindfoot valgus.
The cervical and interosseous ligaments appear intact and the spring
ligament is intact.

Bones: Midfoot degenerative changes are noted. There is edema like
signal abnormality in the navicular bone with subchondral cystic
change. No definite stress fracture.

Other: Unremarkable foot and ankle musculature.
IMPRESSION: 1. Significant tenosynovitis involving both peroneal tendons. No
significant tendinopathy or tendon tear/rupture.
2. Mild distal posterior tibialis tendinopathy with small
interstitial tears.
3. Hindfoot valgus and pes planus with moderate narrowing of the
sinus tarsi.
4. Midfoot degenerative changes.
5. Small focus of artifact noted in the region of the central slip
of the plantar fascia and flexor digitorum brevis muscle. Possible
foreign body or postoperative change.

## 2020-07-26 DIAGNOSIS — G4733 Obstructive sleep apnea (adult) (pediatric): Secondary | ICD-10-CM | POA: Diagnosis not present

## 2020-07-27 ENCOUNTER — Encounter: Payer: Self-pay | Admitting: Podiatry

## 2020-08-18 DIAGNOSIS — M6281 Muscle weakness (generalized): Secondary | ICD-10-CM | POA: Diagnosis not present

## 2020-08-18 DIAGNOSIS — M799 Soft tissue disorder, unspecified: Secondary | ICD-10-CM | POA: Diagnosis not present

## 2020-08-18 DIAGNOSIS — M2142 Flat foot [pes planus] (acquired), left foot: Secondary | ICD-10-CM | POA: Diagnosis not present

## 2020-08-18 DIAGNOSIS — M25572 Pain in left ankle and joints of left foot: Secondary | ICD-10-CM | POA: Diagnosis not present

## 2020-08-20 DIAGNOSIS — M799 Soft tissue disorder, unspecified: Secondary | ICD-10-CM | POA: Diagnosis not present

## 2020-08-20 DIAGNOSIS — M6281 Muscle weakness (generalized): Secondary | ICD-10-CM | POA: Diagnosis not present

## 2020-08-20 DIAGNOSIS — M25572 Pain in left ankle and joints of left foot: Secondary | ICD-10-CM | POA: Diagnosis not present

## 2020-08-20 DIAGNOSIS — M2142 Flat foot [pes planus] (acquired), left foot: Secondary | ICD-10-CM | POA: Diagnosis not present

## 2020-08-23 DIAGNOSIS — M799 Soft tissue disorder, unspecified: Secondary | ICD-10-CM | POA: Diagnosis not present

## 2020-08-23 DIAGNOSIS — M6281 Muscle weakness (generalized): Secondary | ICD-10-CM | POA: Diagnosis not present

## 2020-08-23 DIAGNOSIS — M2142 Flat foot [pes planus] (acquired), left foot: Secondary | ICD-10-CM | POA: Diagnosis not present

## 2020-08-23 DIAGNOSIS — M25572 Pain in left ankle and joints of left foot: Secondary | ICD-10-CM | POA: Diagnosis not present

## 2020-08-25 DIAGNOSIS — M25572 Pain in left ankle and joints of left foot: Secondary | ICD-10-CM | POA: Diagnosis not present

## 2020-08-25 DIAGNOSIS — M2142 Flat foot [pes planus] (acquired), left foot: Secondary | ICD-10-CM | POA: Diagnosis not present

## 2020-08-25 DIAGNOSIS — M799 Soft tissue disorder, unspecified: Secondary | ICD-10-CM | POA: Diagnosis not present

## 2020-08-25 DIAGNOSIS — M6281 Muscle weakness (generalized): Secondary | ICD-10-CM | POA: Diagnosis not present

## 2020-08-30 DIAGNOSIS — M2142 Flat foot [pes planus] (acquired), left foot: Secondary | ICD-10-CM | POA: Diagnosis not present

## 2020-08-30 DIAGNOSIS — M6281 Muscle weakness (generalized): Secondary | ICD-10-CM | POA: Diagnosis not present

## 2020-08-30 DIAGNOSIS — M25572 Pain in left ankle and joints of left foot: Secondary | ICD-10-CM | POA: Diagnosis not present

## 2020-08-30 DIAGNOSIS — M799 Soft tissue disorder, unspecified: Secondary | ICD-10-CM | POA: Diagnosis not present

## 2020-09-01 DIAGNOSIS — M2142 Flat foot [pes planus] (acquired), left foot: Secondary | ICD-10-CM | POA: Diagnosis not present

## 2020-09-01 DIAGNOSIS — M6281 Muscle weakness (generalized): Secondary | ICD-10-CM | POA: Diagnosis not present

## 2020-09-01 DIAGNOSIS — M799 Soft tissue disorder, unspecified: Secondary | ICD-10-CM | POA: Diagnosis not present

## 2020-09-01 DIAGNOSIS — M25572 Pain in left ankle and joints of left foot: Secondary | ICD-10-CM | POA: Diagnosis not present

## 2020-09-08 DIAGNOSIS — M25572 Pain in left ankle and joints of left foot: Secondary | ICD-10-CM | POA: Diagnosis not present

## 2020-09-08 DIAGNOSIS — M799 Soft tissue disorder, unspecified: Secondary | ICD-10-CM | POA: Diagnosis not present

## 2020-09-08 DIAGNOSIS — M6281 Muscle weakness (generalized): Secondary | ICD-10-CM | POA: Diagnosis not present

## 2020-09-08 DIAGNOSIS — M2142 Flat foot [pes planus] (acquired), left foot: Secondary | ICD-10-CM | POA: Diagnosis not present

## 2020-09-30 ENCOUNTER — Ambulatory Visit: Payer: BC Managed Care – PPO | Admitting: Podiatry

## 2020-10-15 DIAGNOSIS — Z23 Encounter for immunization: Secondary | ICD-10-CM | POA: Diagnosis not present

## 2020-10-15 DIAGNOSIS — G4733 Obstructive sleep apnea (adult) (pediatric): Secondary | ICD-10-CM | POA: Diagnosis not present

## 2020-10-15 DIAGNOSIS — M8949 Other hypertrophic osteoarthropathy, multiple sites: Secondary | ICD-10-CM | POA: Diagnosis not present

## 2020-10-15 DIAGNOSIS — I10 Essential (primary) hypertension: Secondary | ICD-10-CM | POA: Diagnosis not present

## 2020-10-15 DIAGNOSIS — E7800 Pure hypercholesterolemia, unspecified: Secondary | ICD-10-CM | POA: Diagnosis not present

## 2020-11-02 DIAGNOSIS — G4733 Obstructive sleep apnea (adult) (pediatric): Secondary | ICD-10-CM | POA: Diagnosis not present

## 2020-11-21 DIAGNOSIS — Z20822 Contact with and (suspected) exposure to covid-19: Secondary | ICD-10-CM | POA: Diagnosis not present

## 2021-02-18 ENCOUNTER — Ambulatory Visit
Admission: RE | Admit: 2021-02-18 | Discharge: 2021-02-18 | Disposition: A | Discharge status: Home / Self Care | Payer: BC Managed Care – PPO | Source: Ambulatory Visit | Attending: Family Medicine | Admitting: Family Medicine

## 2021-02-18 ENCOUNTER — Other Ambulatory Visit: Payer: Self-pay | Admitting: Family Medicine

## 2021-02-18 DIAGNOSIS — M5412 Radiculopathy, cervical region: Secondary | ICD-10-CM

## 2021-02-18 DIAGNOSIS — M5416 Radiculopathy, lumbar region: Secondary | ICD-10-CM

## 2021-02-18 DIAGNOSIS — M4722 Other spondylosis with radiculopathy, cervical region: Secondary | ICD-10-CM | POA: Diagnosis not present

## 2021-02-18 DIAGNOSIS — M47816 Spondylosis without myelopathy or radiculopathy, lumbar region: Secondary | ICD-10-CM | POA: Diagnosis not present

## 2021-02-18 IMAGING — CR DG CERVICAL SPINE 2 OR 3 VIEWS
3 series · 3 of 3 positions shown · non-contrast
Comparison: None.

CLINICAL DATA: Radiculopathy.

EXAM:
CERVICAL SPINE - 2-3 VIEW

[w c-spine lat]
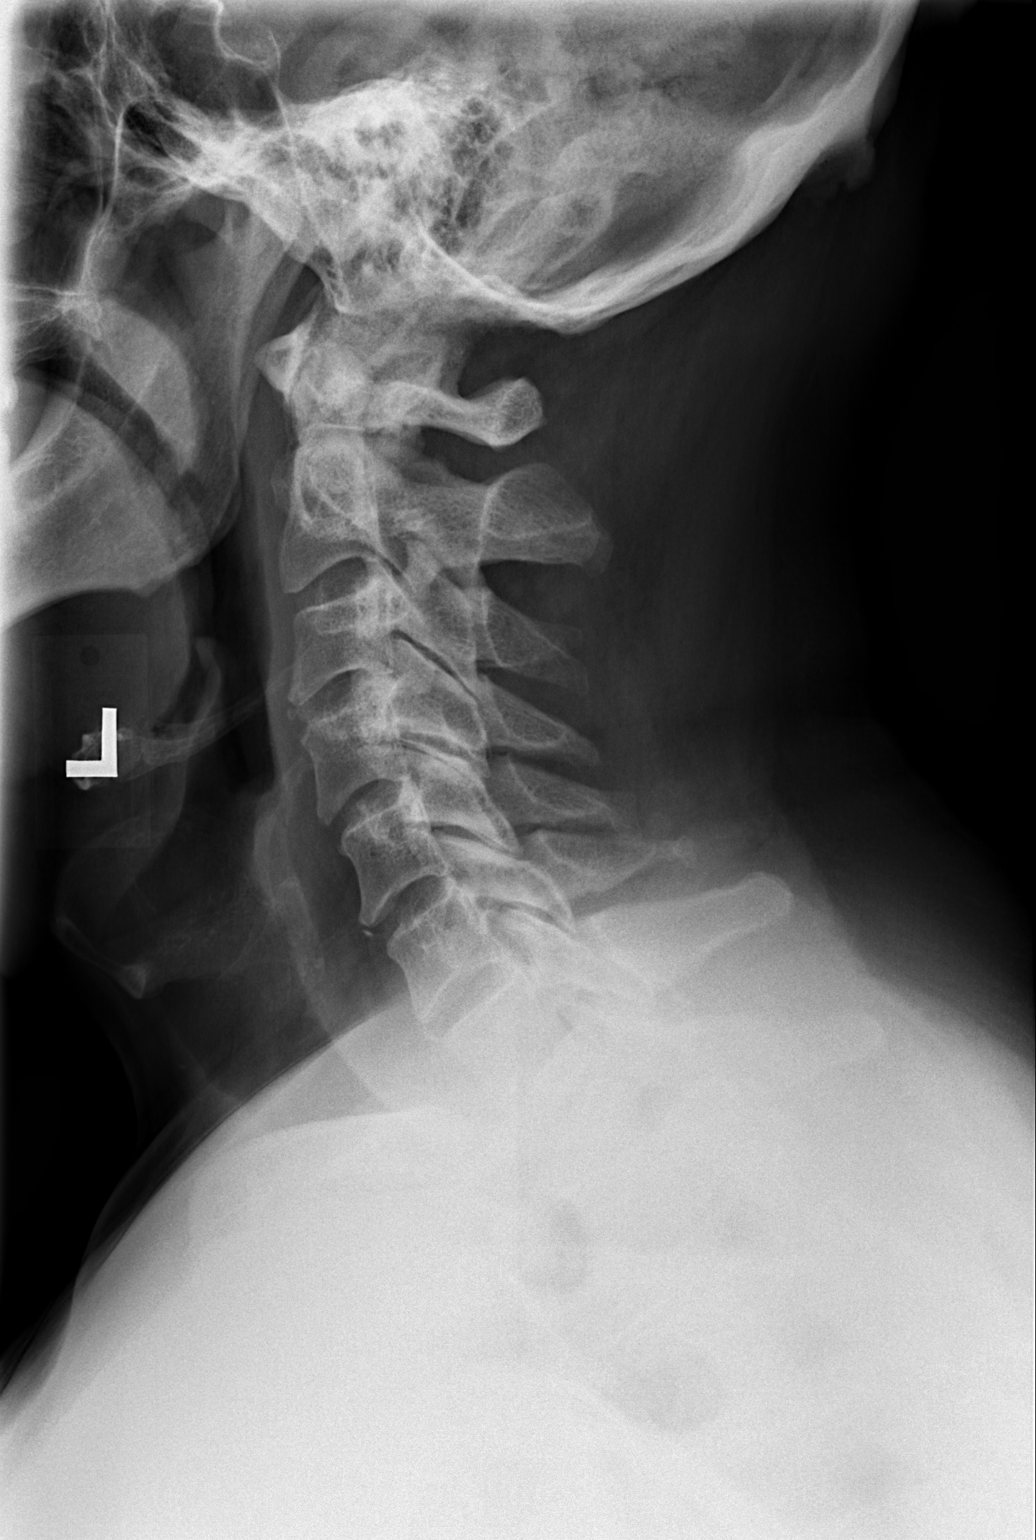

[w c-spine a.p.]
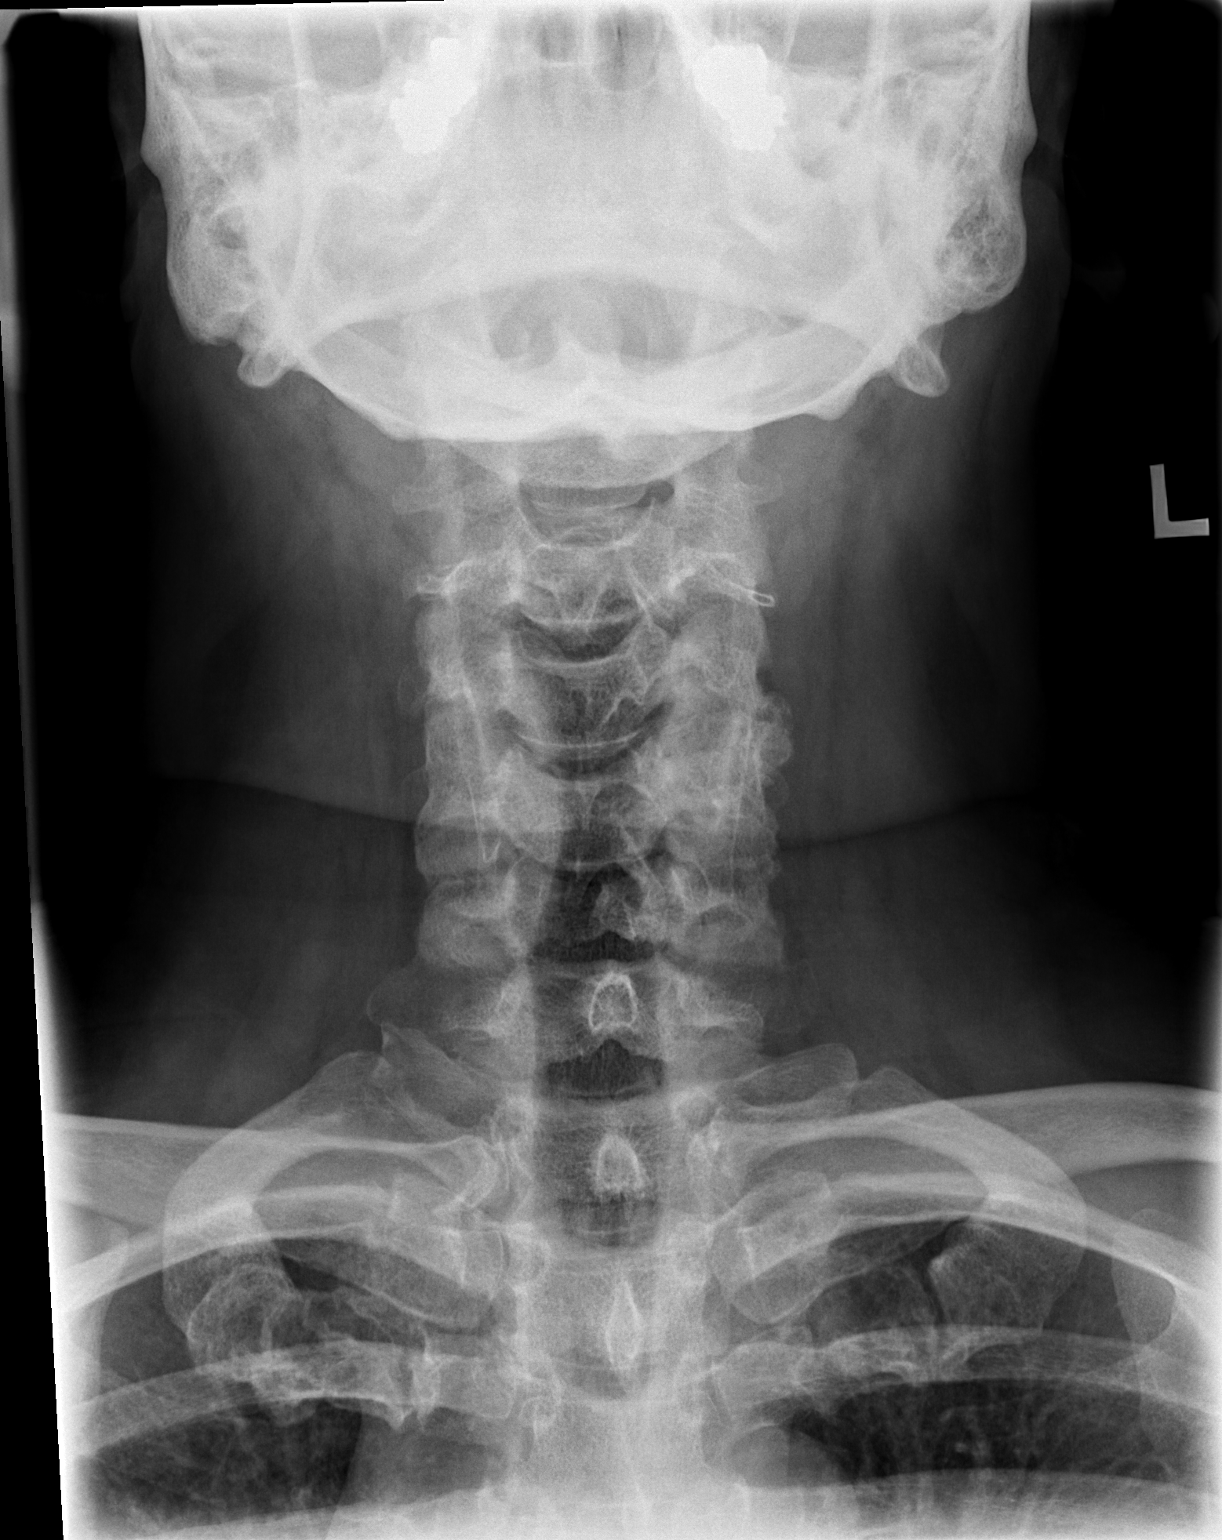

[w c-spine odontoid]
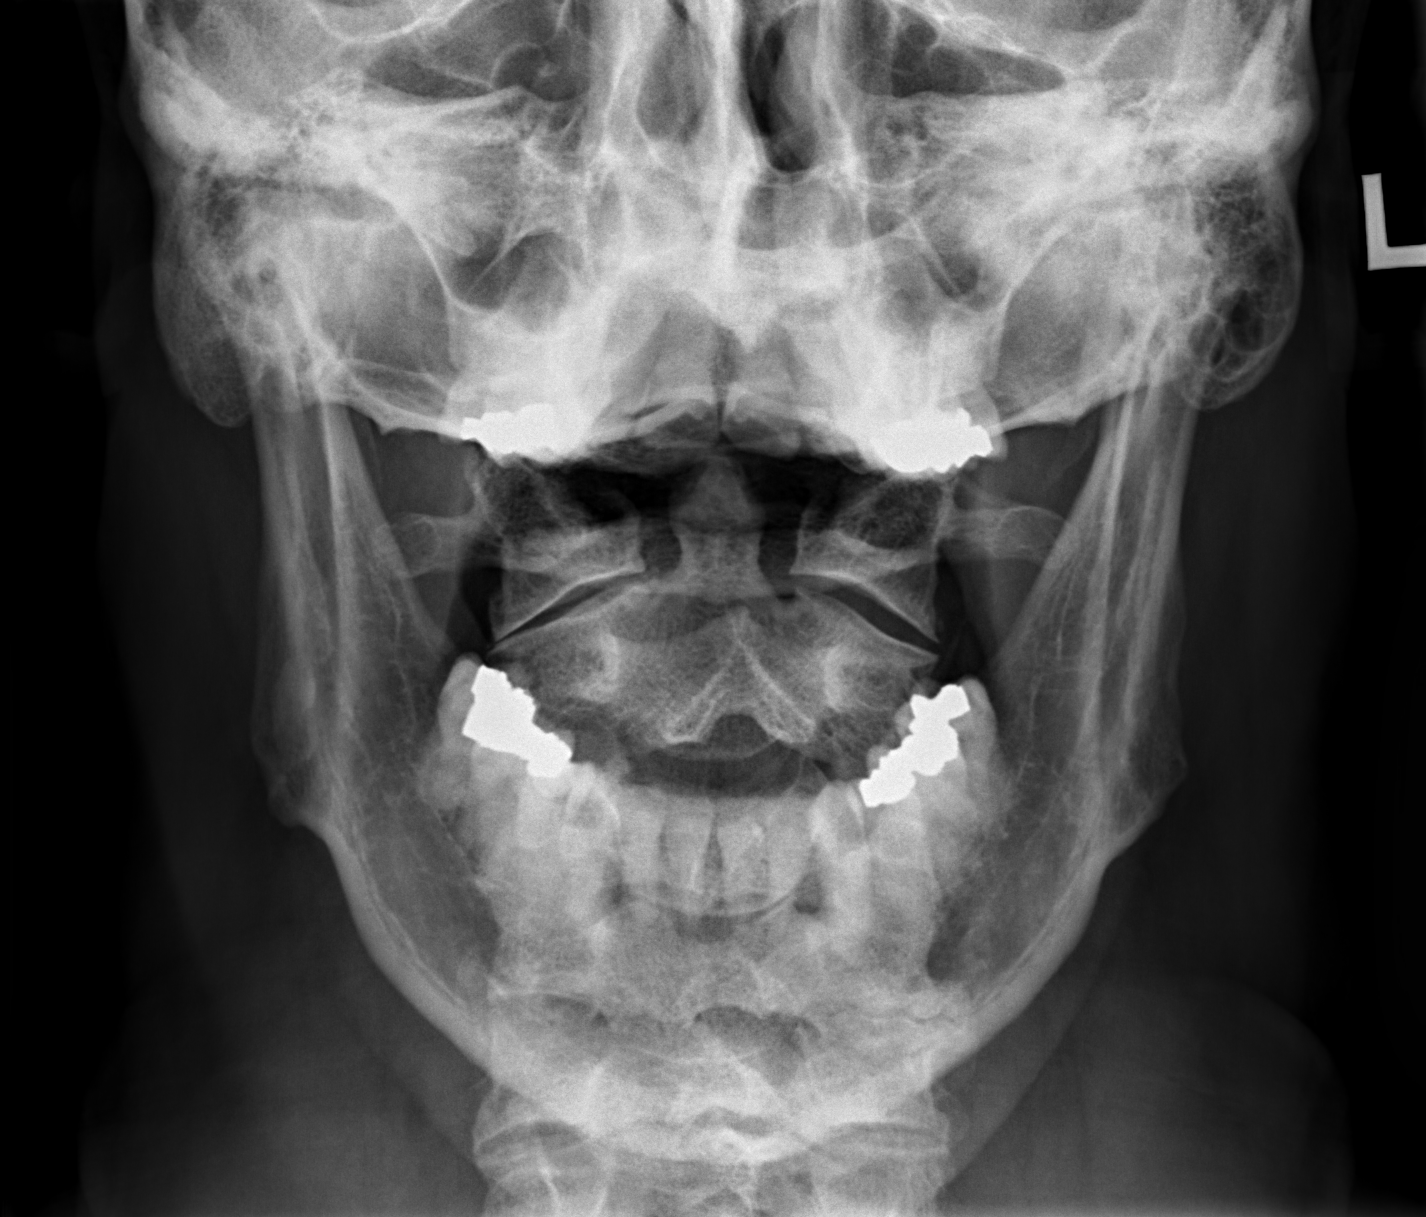

[3 of 3 positions shown; findings below may reference images not displayed]

FINDINGS: The cervical spine is well seen through C6. C7 and the
cervicothoracic junction are partially obscured by overlapping bones
and soft tissues. No malalignment or fracture noted. Uncovertebral
degenerative changes identified. No other abnormalities.
IMPRESSION: Uncovertebral degenerative changes. Poor visualization of C7 and the
cervicothoracic junction. No other abnormalities.

## 2021-04-08 DIAGNOSIS — Z20822 Contact with and (suspected) exposure to covid-19: Secondary | ICD-10-CM | POA: Diagnosis not present

## 2021-04-19 DIAGNOSIS — Z125 Encounter for screening for malignant neoplasm of prostate: Secondary | ICD-10-CM | POA: Diagnosis not present

## 2021-04-19 DIAGNOSIS — I10 Essential (primary) hypertension: Secondary | ICD-10-CM | POA: Diagnosis not present

## 2021-04-19 DIAGNOSIS — Z0000 Encounter for general adult medical examination without abnormal findings: Secondary | ICD-10-CM | POA: Diagnosis not present

## 2021-04-19 DIAGNOSIS — L237 Allergic contact dermatitis due to plants, except food: Secondary | ICD-10-CM | POA: Diagnosis not present

## 2021-04-19 DIAGNOSIS — E7800 Pure hypercholesterolemia, unspecified: Secondary | ICD-10-CM | POA: Diagnosis not present

## 2021-04-19 DIAGNOSIS — Z23 Encounter for immunization: Secondary | ICD-10-CM | POA: Diagnosis not present

## 2021-05-04 DIAGNOSIS — G4733 Obstructive sleep apnea (adult) (pediatric): Secondary | ICD-10-CM | POA: Diagnosis not present

## 2021-05-16 ENCOUNTER — Other Ambulatory Visit: Payer: Self-pay | Admitting: Family Medicine

## 2021-05-16 DIAGNOSIS — M5412 Radiculopathy, cervical region: Secondary | ICD-10-CM

## 2021-05-25 ENCOUNTER — Ambulatory Visit
Admission: RE | Admit: 2021-05-25 | Discharge: 2021-05-25 | Disposition: A | Discharge status: Home / Self Care | Payer: BC Managed Care – PPO | Source: Ambulatory Visit | Attending: Family Medicine | Admitting: Family Medicine

## 2021-05-25 DIAGNOSIS — M5412 Radiculopathy, cervical region: Secondary | ICD-10-CM

## 2021-05-25 DIAGNOSIS — M5011 Cervical disc disorder with radiculopathy,  high cervical region: Secondary | ICD-10-CM | POA: Diagnosis not present

## 2021-05-25 DIAGNOSIS — M50122 Cervical disc disorder at C5-C6 level with radiculopathy: Secondary | ICD-10-CM | POA: Diagnosis not present

## 2021-05-25 DIAGNOSIS — M4722 Other spondylosis with radiculopathy, cervical region: Secondary | ICD-10-CM | POA: Diagnosis not present

## 2021-05-25 DIAGNOSIS — M50123 Cervical disc disorder at C6-C7 level with radiculopathy: Secondary | ICD-10-CM | POA: Diagnosis not present

## 2021-05-25 IMAGING — MR MR CERVICAL SPINE W/O CM
4 of 5 series · 27 of 48 positions shown · non-contrast
Comparison: Radiographs of the cervical spine [DATE]. MRI of
the cervical spine [DATE].

CLINICAL DATA: Cervical radiculopathy [3F] ([3F]-CM).
Additional history provided by scanning technologist: Patient
reports posterior pain shooting to right shoulder intermittently for
2 years, swelling in hands.

EXAM:
MRI CERVICAL SPINE WITHOUT CONTRAST
TECHNIQUE: Multiplanar, multisequence MR imaging of the cervical spine was
performed. No intravenous contrast was administered.

[Series 5: T2 · sagittal · 3.0mm · 0.55mm/px · 7 of 15 slices shown (1 of 2)]
[im 1/15]
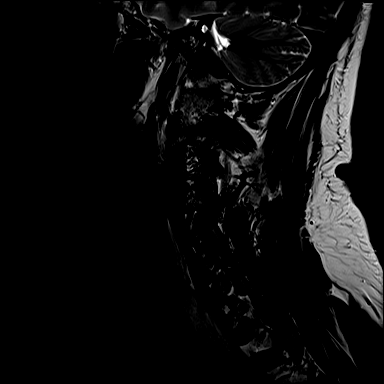
[im 3/15]
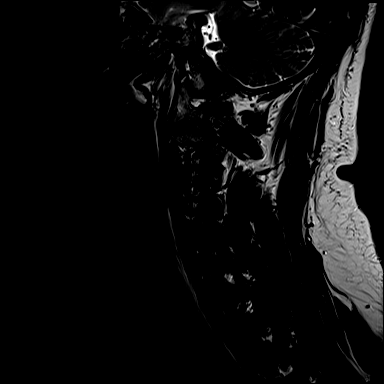
[im 5/15]
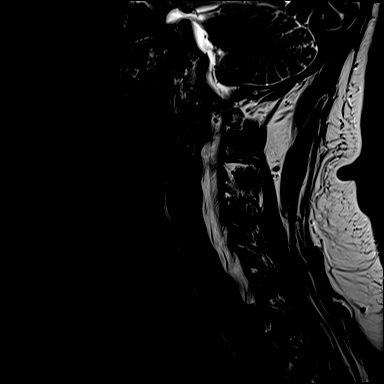
[im 8/15]
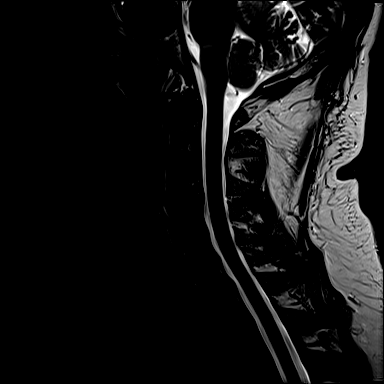
[im 10/15]
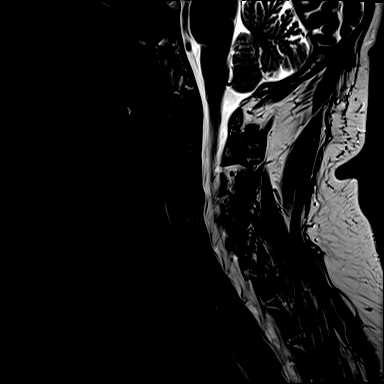
[im 12/15]
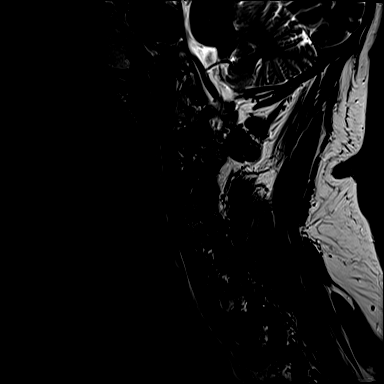
[im 15/15]
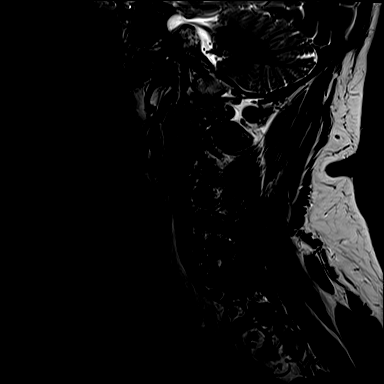

[Series 6: T1 · sagittal · 3.0mm · 0.66mm/px · 7 of 15 slices shown]
[im 1/15]
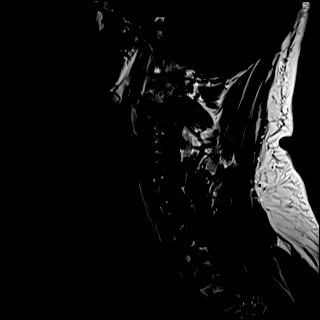
[im 3/15]
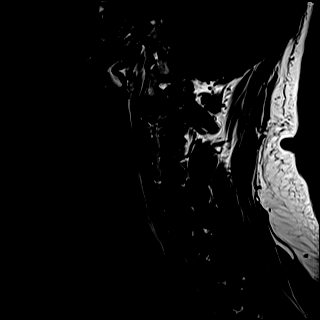
[im 5/15]
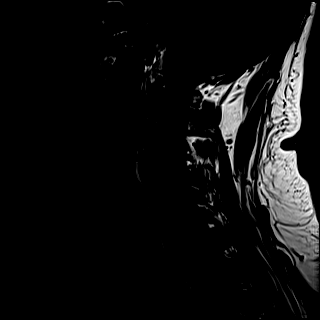
[im 8/15]
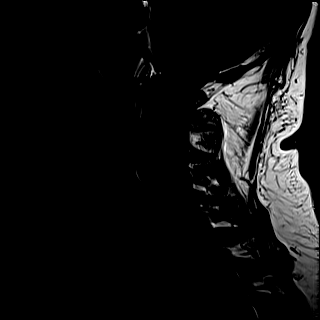
[im 10/15]
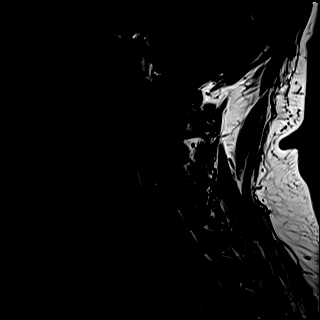
[im 12/15]
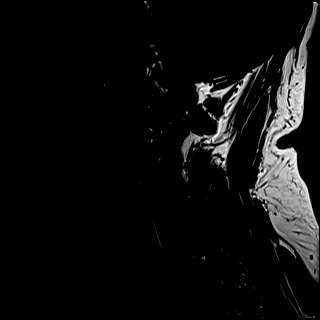
[im 15/15]
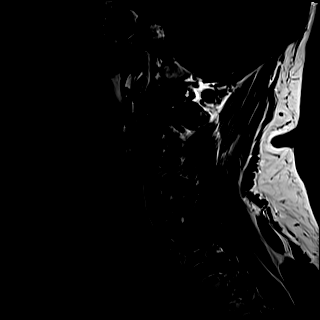

[Series 7: STIR · sagittal · 3.0mm · 0.33mm/px · 5 of 15 slices shown]
[im 1/15]
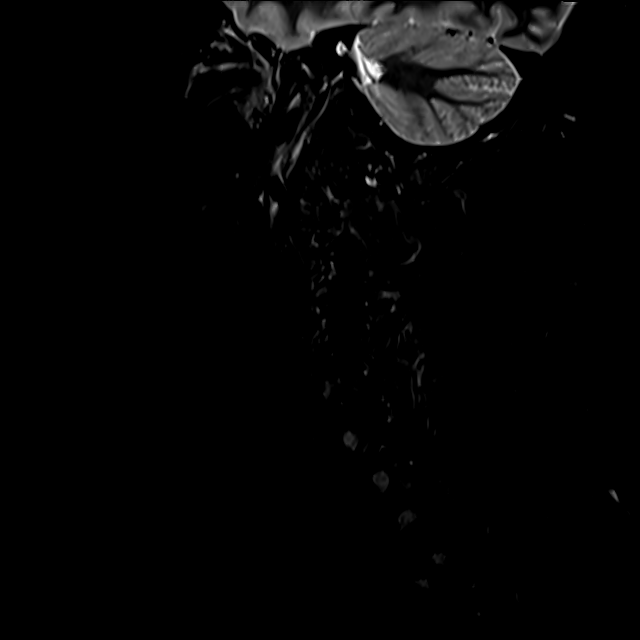
[im 3/15]
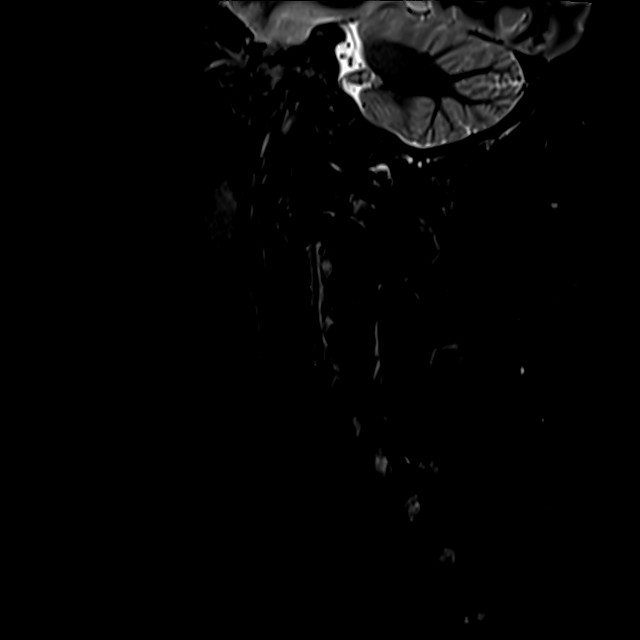
[im 6/15]
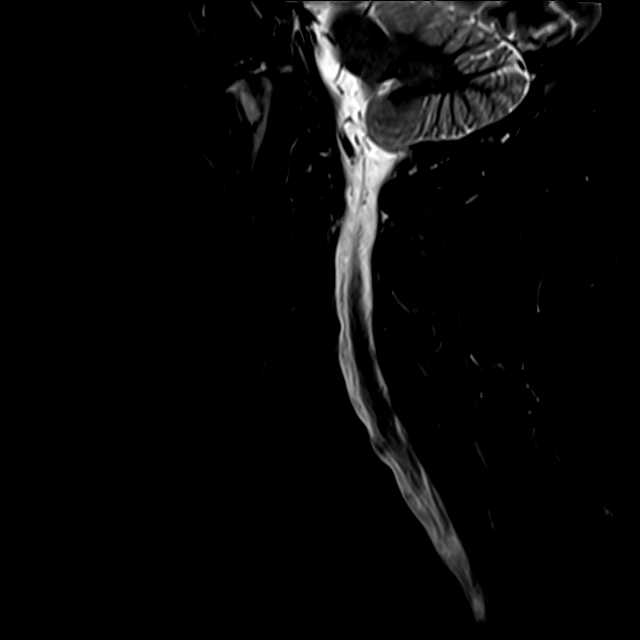
[im 9/15]
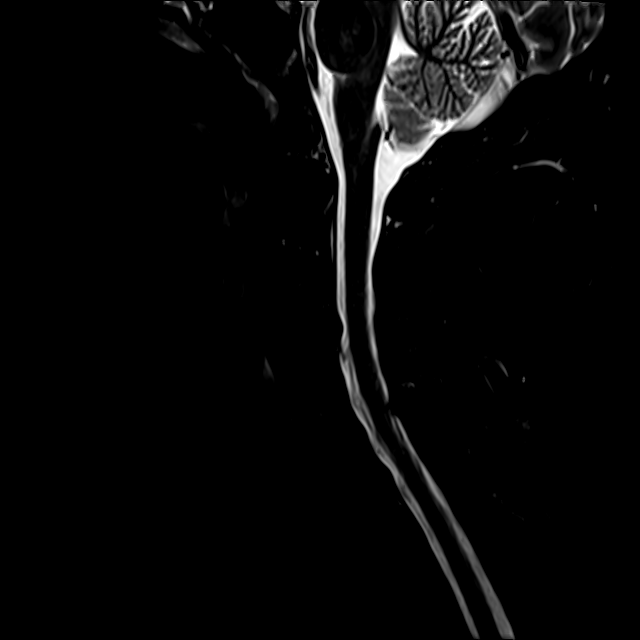
[im 15/15]
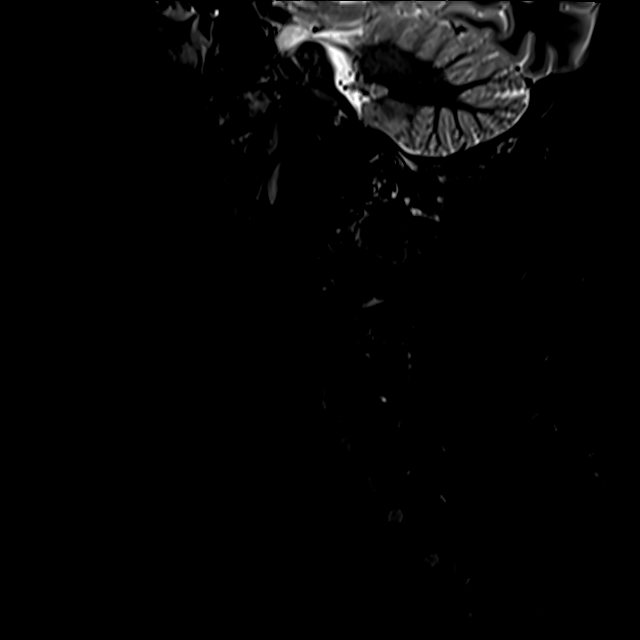

[Series 8: T2 · axial · 3.0mm · 0.50mm/px · z∈[-78,+23]mm · 8 of 34 slices shown (2 of 2)]
[im 1/34]
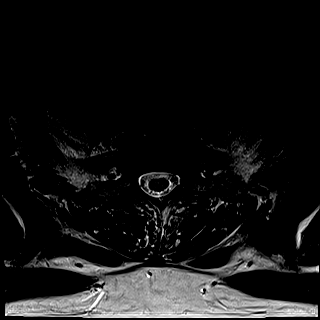
[im 6/34]
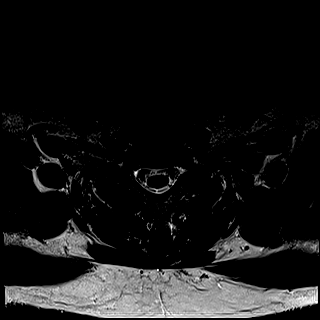
[im 11/34]
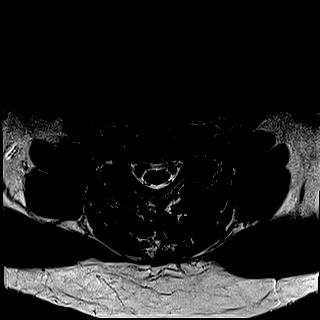
[im 16/34]
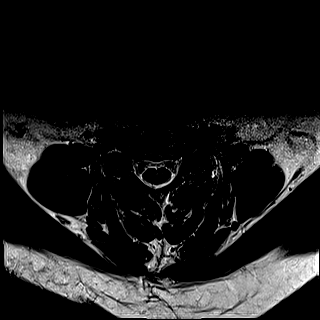
[im 18/34]
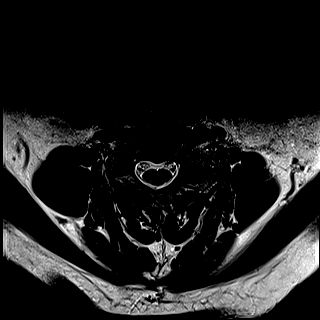
[im 23/34]
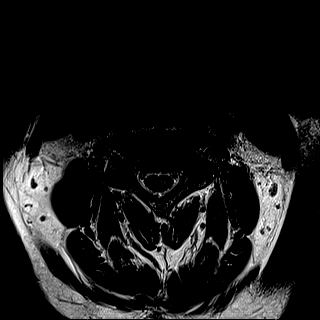
[im 28/34]
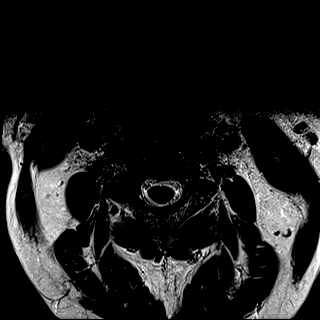
[im 34/34]
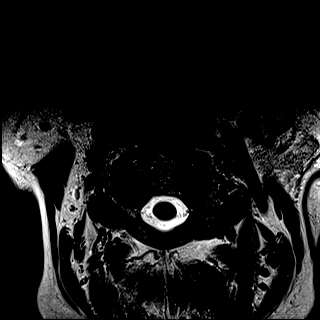

[27 of 48 positions shown; findings below may reference images not displayed]

FINDINGS: Alignment: Trace C3-C4 grade 1 retrolisthesis.

Vertebrae: Vertebral body height is maintained. No significant
marrow edema or focal suspicious osseous lesion.

Cord: No spinal cord signal abnormality is identified.

Posterior Fossa, vertebral arteries, paraspinal tissues: No
abnormality identified within included portions of the posterior
fossa. Flow voids preserved within the imaged cervical vertebral
arteries. Paraspinal soft tissues within normal limits.

Disc levels:

Unless otherwise stated, the level by level findings below have not
significantly changed from the prior MRI of [DATE].

No more than mild disc degeneration at any level.

C2-C3: No significant disc herniation or stenosis.

C3-C4: Trace grade 1 anterolisthesis. Disc bulge. Bilateral
uncovertebral hypertrophy. Facet arthrosis. No significant spinal
canal stenosis. Mild right neural foraminal narrowing, new from the
prior exam.

C4-C5: No significant disc herniation or spinal canal stenosis.
Mild-to-moderate facet arthrosis. Mild relative right neural
foraminal narrowing.

C5-C6: Shallow broad-based central disc protrusion. Uncovertebral
hypertrophy on the right. Facet arthrosis with mild ligamentum
flavum hypertrophy. Mild partial effacement of the ventral thecal
sac without spinal cord mass effect. Moderate right neural foraminal
narrowing.

C6-C7: Shallow disc bulge. Minimal facet arthrosis. No significant
spinal canal or foraminal stenosis.

C7-T1: No significant disc herniation or stenosis.
IMPRESSION: Comparison is made to the prior cervical spine MRI of [DATE].

Mild right C3-C4 neural foraminal narrowing is new from the prior
exam.

Cervical spondylosis, as outlined and otherwise unchanged. No more
than mild relative spinal canal narrowing (without spinal cord mass
effect). Moderate neural foraminal narrowing on the right at C5-C6.
Mild relative neural foraminal narrowing on the right at C4-C5.

## 2021-05-27 ENCOUNTER — Emergency Department (HOSPITAL_COMMUNITY): Payer: BC Managed Care – PPO

## 2021-05-27 ENCOUNTER — Inpatient Hospital Stay (HOSPITAL_COMMUNITY)
Admission: EM | Disposition: A | Discharge status: Home / Self Care | Payer: Self-pay | Source: Home / Self Care | Attending: Internal Medicine

## 2021-05-27 ENCOUNTER — Inpatient Hospital Stay (HOSPITAL_COMMUNITY)
Admission: EM | Admit: 2021-05-27 | Discharge: 2021-05-29 | DRG: 246 | Disposition: A | Discharge status: Home / Self Care | Payer: BC Managed Care – PPO | Attending: Internal Medicine | Admitting: Internal Medicine

## 2021-05-27 DIAGNOSIS — I213 ST elevation (STEMI) myocardial infarction of unspecified site: Secondary | ICD-10-CM

## 2021-05-27 DIAGNOSIS — Z888 Allergy status to other drugs, medicaments and biological substances status: Secondary | ICD-10-CM

## 2021-05-27 DIAGNOSIS — I2109 ST elevation (STEMI) myocardial infarction involving other coronary artery of anterior wall: Principal | ICD-10-CM | POA: Diagnosis present

## 2021-05-27 DIAGNOSIS — Z833 Family history of diabetes mellitus: Secondary | ICD-10-CM

## 2021-05-27 DIAGNOSIS — I249 Acute ischemic heart disease, unspecified: Secondary | ICD-10-CM | POA: Diagnosis not present

## 2021-05-27 DIAGNOSIS — I214 Non-ST elevation (NSTEMI) myocardial infarction: Secondary | ICD-10-CM | POA: Diagnosis not present

## 2021-05-27 DIAGNOSIS — Z8249 Family history of ischemic heart disease and other diseases of the circulatory system: Secondary | ICD-10-CM | POA: Diagnosis not present

## 2021-05-27 DIAGNOSIS — I2102 ST elevation (STEMI) myocardial infarction involving left anterior descending coronary artery: Secondary | ICD-10-CM

## 2021-05-27 DIAGNOSIS — Z791 Long term (current) use of non-steroidal anti-inflammatories (NSAID): Secondary | ICD-10-CM

## 2021-05-27 DIAGNOSIS — I2510 Atherosclerotic heart disease of native coronary artery without angina pectoris: Secondary | ICD-10-CM | POA: Diagnosis not present

## 2021-05-27 DIAGNOSIS — G4733 Obstructive sleep apnea (adult) (pediatric): Secondary | ICD-10-CM | POA: Diagnosis present

## 2021-05-27 DIAGNOSIS — R12 Heartburn: Secondary | ICD-10-CM | POA: Diagnosis not present

## 2021-05-27 DIAGNOSIS — U071 COVID-19: Secondary | ICD-10-CM | POA: Diagnosis not present

## 2021-05-27 DIAGNOSIS — Z83438 Family history of other disorder of lipoprotein metabolism and other lipidemia: Secondary | ICD-10-CM

## 2021-05-27 DIAGNOSIS — I10 Essential (primary) hypertension: Secondary | ICD-10-CM

## 2021-05-27 DIAGNOSIS — I517 Cardiomegaly: Secondary | ICD-10-CM | POA: Diagnosis not present

## 2021-05-27 DIAGNOSIS — R0789 Other chest pain: Secondary | ICD-10-CM | POA: Diagnosis not present

## 2021-05-27 DIAGNOSIS — I255 Ischemic cardiomyopathy: Secondary | ICD-10-CM

## 2021-05-27 DIAGNOSIS — Z7982 Long term (current) use of aspirin: Secondary | ICD-10-CM | POA: Diagnosis not present

## 2021-05-27 DIAGNOSIS — I472 Ventricular tachycardia: Secondary | ICD-10-CM | POA: Diagnosis not present

## 2021-05-27 DIAGNOSIS — Z955 Presence of coronary angioplasty implant and graft: Secondary | ICD-10-CM

## 2021-05-27 DIAGNOSIS — E785 Hyperlipidemia, unspecified: Secondary | ICD-10-CM | POA: Diagnosis present

## 2021-05-27 DIAGNOSIS — Z79899 Other long term (current) drug therapy: Secondary | ICD-10-CM | POA: Diagnosis not present

## 2021-05-27 DIAGNOSIS — I499 Cardiac arrhythmia, unspecified: Secondary | ICD-10-CM | POA: Diagnosis not present

## 2021-05-27 DIAGNOSIS — R079 Chest pain, unspecified: Secondary | ICD-10-CM | POA: Diagnosis not present

## 2021-05-27 HISTORY — DX: Atherosclerotic heart disease of native coronary artery without angina pectoris: I25.10

## 2021-05-27 HISTORY — PX: LEFT HEART CATH AND CORONARY ANGIOGRAPHY: CATH118249

## 2021-05-27 HISTORY — DX: Essential (primary) hypertension: I10

## 2021-05-27 HISTORY — PX: CORONARY/GRAFT ACUTE MI REVASCULARIZATION: CATH118305

## 2021-05-27 HISTORY — PX: CORONARY STENT INTERVENTION: CATH118234

## 2021-05-27 LAB — CBC WITH DIFFERENTIAL/PLATELET
Abs Immature Granulocytes: 0.04 10*3/uL (ref 0.00–0.07)
Basophils Absolute: 0.1 10*3/uL (ref 0.0–0.1)
Basophils Relative: 1 %
Eosinophils Absolute: 0.2 10*3/uL (ref 0.0–0.5)
Eosinophils Relative: 2 %
HCT: 46.1 % (ref 39.0–52.0)
Hemoglobin: 15.5 g/dL (ref 13.0–17.0)
Immature Granulocytes: 0 %
Lymphocytes Relative: 33 %
Lymphs Abs: 3.7 10*3/uL (ref 0.7–4.0)
MCH: 30 pg (ref 26.0–34.0)
MCHC: 33.6 g/dL (ref 30.0–36.0)
MCV: 89.2 fL (ref 80.0–100.0)
Monocytes Absolute: 1.2 10*3/uL — ABNORMAL HIGH (ref 0.1–1.0)
Monocytes Relative: 10 %
Neutro Abs: 6.2 10*3/uL (ref 1.7–7.7)
Neutrophils Relative %: 54 %
Platelets: 303 10*3/uL (ref 150–400)
RBC: 5.17 MIL/uL (ref 4.22–5.81)
RDW: 12.4 % (ref 11.5–15.5)
WBC: 11.3 10*3/uL — ABNORMAL HIGH (ref 4.0–10.5)
nRBC: 0 % (ref 0.0–0.2)

## 2021-05-27 LAB — POCT I-STAT, CHEM 8
BUN: 18 mg/dL (ref 6–20)
Calcium, Ion: 1.14 mmol/L — ABNORMAL LOW (ref 1.15–1.40)
Chloride: 101 mmol/L (ref 98–111)
Creatinine, Ser: 0.6 mg/dL — ABNORMAL LOW (ref 0.61–1.24)
Glucose, Bld: 179 mg/dL — ABNORMAL HIGH (ref 70–99)
HCT: 43 % (ref 39.0–52.0)
Hemoglobin: 14.6 g/dL (ref 13.0–17.0)
Potassium: 2.8 mmol/L — ABNORMAL LOW (ref 3.5–5.1)
Sodium: 135 mmol/L (ref 135–145)
TCO2: 19 mmol/L — ABNORMAL LOW (ref 22–32)

## 2021-05-27 LAB — PROTIME-INR
INR: 0.9 (ref 0.8–1.2)
Prothrombin Time: 12.6 seconds (ref 11.4–15.2)

## 2021-05-27 LAB — APTT: aPTT: 25 seconds (ref 24–36)

## 2021-05-27 LAB — HEMOGLOBIN A1C
Hgb A1c MFr Bld: 5.7 % — ABNORMAL HIGH (ref 4.8–5.6)
Mean Plasma Glucose: 116.89 mg/dL

## 2021-05-27 LAB — LIPID PANEL
Cholesterol: 174 mg/dL (ref 0–200)
HDL: 48 mg/dL (ref >40)
LDL Cholesterol: 109 mg/dL — ABNORMAL HIGH (ref 0–99)
Total CHOL/HDL Ratio: 3.6 RATIO
Triglycerides: 87 mg/dL (ref <150)
VLDL: 17 mg/dL (ref 0–40)

## 2021-05-27 LAB — COMPREHENSIVE METABOLIC PANEL
ALT: 43 U/L (ref 0–44)
AST: 35 U/L (ref 15–41)
Albumin: 4.3 g/dL (ref 3.5–5.0)
Alkaline Phosphatase: 80 U/L (ref 38–126)
Anion gap: 14 (ref 5–15)
BUN: 19 mg/dL (ref 6–20)
CO2: 20 mmol/L — ABNORMAL LOW (ref 22–32)
Calcium: 10.1 mg/dL (ref 8.9–10.3)
Chloride: 107 mmol/L (ref 98–111)
Creatinine, Ser: 1.02 mg/dL (ref 0.61–1.24)
GFR, Estimated: >60 mL/min (ref >60)
Glucose, Bld: 156 mg/dL — ABNORMAL HIGH (ref 70–99)
Potassium: 3.4 mmol/L — ABNORMAL LOW (ref 3.5–5.1)
Sodium: 141 mmol/L (ref 135–145)
Total Bilirubin: 1 mg/dL (ref 0.3–1.2)
Total Protein: 7.3 g/dL (ref 6.5–8.1)

## 2021-05-27 LAB — RESP PANEL BY RT-PCR (FLU A&B, COVID) ARPGX2
Influenza A by PCR: NEGATIVE
Influenza B by PCR: NEGATIVE
SARS Coronavirus 2 by RT PCR: POSITIVE — AB

## 2021-05-27 LAB — TROPONIN I (HIGH SENSITIVITY)
Troponin I (High Sensitivity): 33 ng/L — ABNORMAL HIGH (ref <18)
Troponin I (High Sensitivity): 4290 ng/L (ref <18)

## 2021-05-27 LAB — POCT ACTIVATED CLOTTING TIME
Activated Clotting Time: 231 seconds
Activated Clotting Time: 265 seconds

## 2021-05-27 IMAGING — DX DG CHEST 1V PORT
1 series · 1 of 1 positions shown · non-contrast
Comparison: [DATE]

CLINICAL DATA: Chest pain

EXAM:
PORTABLE CHEST 1 VIEW

[chest ap]
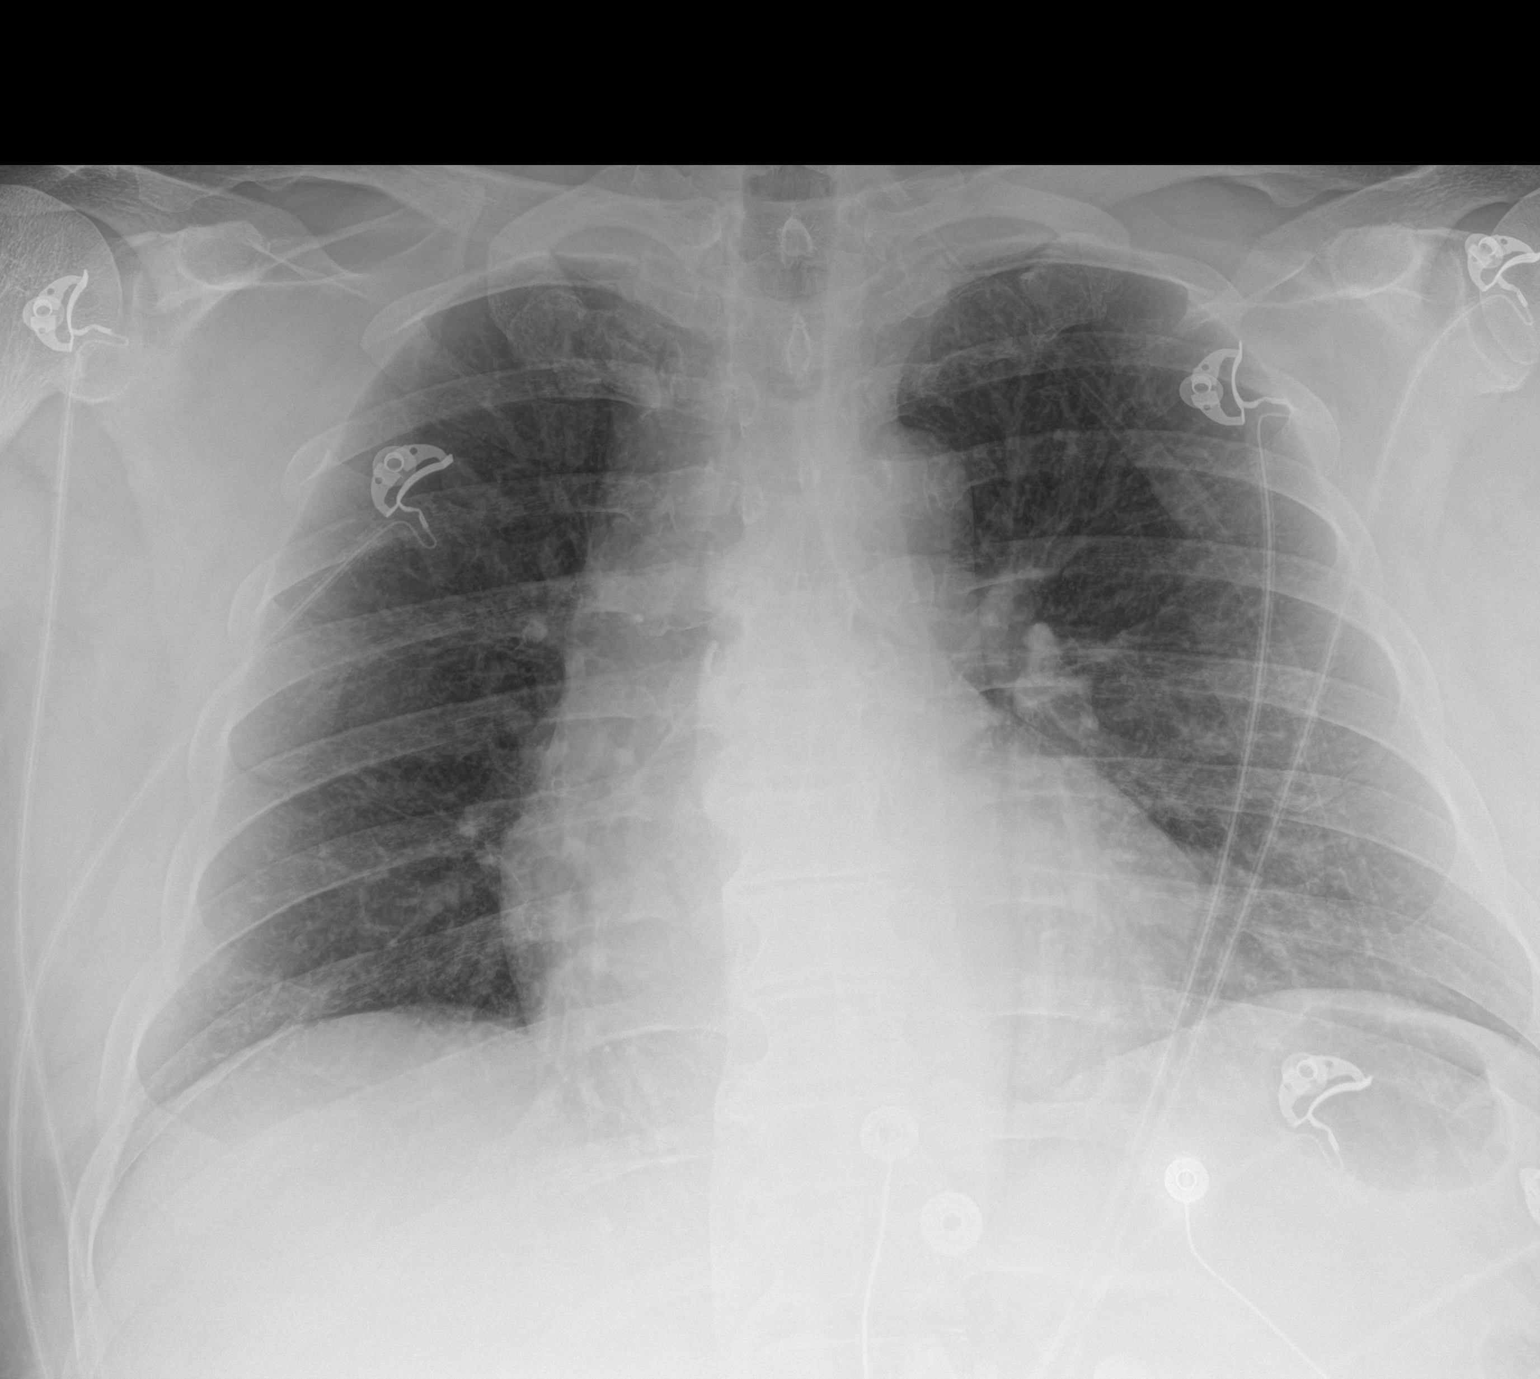

[1 of 1 positions shown; findings below may reference images not displayed]

FINDINGS: Cardiomegaly. Both lungs are clear. The visualized skeletal
structures are unremarkable.
IMPRESSION: Cardiomegaly without acute abnormality of the lungs in AP portable
projection.

## 2021-05-27 SURGERY — LEFT HEART CATH AND CORONARY ANGIOGRAPHY
Anesthesia: LOCAL

## 2021-05-27 MED ORDER — ASPIRIN 81 MG PO CHEW
324.0000 mg | CHEWABLE_TABLET | Freq: Once | ORAL | Status: DC
  Filled 2021-05-27: qty 4

## 2021-05-27 MED ORDER — ONDANSETRON HCL 4 MG/2ML IJ SOLN: 4.0000 mg | Freq: Four times a day (QID) | INTRAMUSCULAR | Status: DC | PRN

## 2021-05-27 MED ORDER — NITROGLYCERIN 0.4 MG SL SUBL: 0.4000 mg | SUBLINGUAL_TABLET | Freq: Once | SUBLINGUAL | Status: DC

## 2021-05-27 MED ORDER — FENTANYL CITRATE (PF) 100 MCG/2ML IJ SOLN
INTRAMUSCULAR | Status: DC | PRN
  Administered 2021-05-27: 50 ug via INTRAVENOUS
  Administered 2021-05-27: 25 ug via INTRAVENOUS

## 2021-05-27 MED ORDER — TIROFIBAN HCL IN NACL 5-0.9 MG/100ML-% IV SOLN: 0.1500 ug/kg/min | INTRAVENOUS | Status: DC

## 2021-05-27 MED ORDER — ONDANSETRON HCL 4 MG/2ML IJ SOLN: 4.0000 mg | Freq: Once | INTRAMUSCULAR | Status: DC

## 2021-05-27 MED ORDER — MORPHINE SULFATE (PF) 4 MG/ML IV SOLN
4.0000 mg | Freq: Once | INTRAVENOUS | Status: AC
  Administered 2021-05-27: 4 mg via INTRAVENOUS

## 2021-05-27 MED ORDER — POTASSIUM CHLORIDE 20 MEQ PO PACK
40.0000 meq | PACK | Freq: Two times a day (BID) | ORAL | Status: DC
  Administered 2021-05-27 – 2021-05-28 (×2): 40 meq via ORAL
  Filled 2021-05-27 (×2): qty 2

## 2021-05-27 MED ORDER — HEPARIN (PORCINE) IN NACL 1000-0.9 UT/500ML-% IV SOLN
INTRAVENOUS | Status: AC
  Filled 2021-05-27: qty 1000

## 2021-05-27 MED ORDER — MIDAZOLAM HCL 2 MG/2ML IJ SOLN
INTRAMUSCULAR | Status: DC | PRN
  Administered 2021-05-27: 2 mg via INTRAVENOUS
  Administered 2021-05-27: 1 mg via INTRAVENOUS

## 2021-05-27 MED ORDER — NITROGLYCERIN 0.4 MG SL SUBL: 0.4000 mg | SUBLINGUAL_TABLET | SUBLINGUAL | Status: DC | PRN

## 2021-05-27 MED ORDER — MIDAZOLAM HCL 2 MG/2ML IJ SOLN
INTRAMUSCULAR | Status: AC
  Filled 2021-05-27: qty 2

## 2021-05-27 MED ORDER — HEPARIN SODIUM (PORCINE) 1000 UNIT/ML IJ SOLN
INTRAMUSCULAR | Status: AC
  Filled 2021-05-27: qty 1

## 2021-05-27 MED ORDER — HEPARIN SODIUM (PORCINE) 1000 UNIT/ML IJ SOLN
INTRAMUSCULAR | Status: DC | PRN
  Administered 2021-05-27: 2000 [IU] via INTRAVENOUS
  Administered 2021-05-27: 3000 [IU] via INTRAVENOUS
  Administered 2021-05-27: 4000 [IU] via INTRAVENOUS
  Administered 2021-05-27: 2000 [IU] via INTRAVENOUS

## 2021-05-27 MED ORDER — CARVEDILOL 3.125 MG PO TABS
3.1250 mg | ORAL_TABLET | Freq: Two times a day (BID) | ORAL | Status: DC
  Administered 2021-05-28: 3.125 mg via ORAL
  Filled 2021-05-27: qty 1

## 2021-05-27 MED ORDER — TIROFIBAN (AGGRASTAT) BOLUS VIA INFUSION
INTRAVENOUS | Status: DC | PRN
Start: 2021-05-27 — End: 2021-05-27
  Administered 2021-05-27: 2552.5 ug via INTRAVENOUS

## 2021-05-27 MED ORDER — LABETALOL HCL 5 MG/ML IV SOLN: 10.0000 mg | INTRAVENOUS | Status: AC | PRN

## 2021-05-27 MED ORDER — FENTANYL CITRATE (PF) 100 MCG/2ML IJ SOLN
INTRAMUSCULAR | Status: AC
  Filled 2021-05-27: qty 2

## 2021-05-27 MED ORDER — SODIUM CHLORIDE 0.9 % IV SOLN: INTRAVENOUS | Status: AC

## 2021-05-27 MED ORDER — TICAGRELOR 90 MG PO TABS
90.0000 mg | ORAL_TABLET | Freq: Two times a day (BID) | ORAL | Status: DC
  Administered 2021-05-27 – 2021-05-29 (×4): 90 mg via ORAL
  Filled 2021-05-27 (×4): qty 1

## 2021-05-27 MED ORDER — SODIUM CHLORIDE 0.9 % IV SOLN: INTRAVENOUS | Status: DC

## 2021-05-27 MED ORDER — ACETAMINOPHEN 325 MG PO TABS: 650.0000 mg | ORAL_TABLET | ORAL | Status: DC | PRN

## 2021-05-27 MED ORDER — IOHEXOL 350 MG/ML SOLN
INTRAVENOUS | Status: DC | PRN
  Administered 2021-05-27: 190 mL

## 2021-05-27 MED ORDER — HEPARIN (PORCINE) IN NACL 1000-0.9 UT/500ML-% IV SOLN
INTRAVENOUS | Status: DC | PRN
  Administered 2021-05-27 (×2): 500 mL

## 2021-05-27 MED ORDER — VERAPAMIL HCL 2.5 MG/ML IV SOLN
INTRAVENOUS | Status: AC
  Filled 2021-05-27: qty 2

## 2021-05-27 MED ORDER — DIAZEPAM 5 MG PO TABS: 5.0000 mg | ORAL_TABLET | ORAL | Status: DC | PRN

## 2021-05-27 MED ORDER — PRAVASTATIN SODIUM 40 MG PO TABS: 80.0000 mg | ORAL_TABLET | Freq: Every day | ORAL | Status: DC

## 2021-05-27 MED ORDER — ASPIRIN 81 MG PO CHEW
81.0000 mg | CHEWABLE_TABLET | Freq: Every day | ORAL | Status: DC
  Administered 2021-05-28 – 2021-05-29 (×2): 81 mg via ORAL
  Filled 2021-05-27 (×2): qty 1

## 2021-05-27 MED ORDER — SODIUM CHLORIDE 0.9% FLUSH: 3.0000 mL | INTRAVENOUS | Status: DC | PRN

## 2021-05-27 MED ORDER — MORPHINE SULFATE (PF) 4 MG/ML IV SOLN
4.0000 mg | Freq: Once | INTRAVENOUS | Status: DC
  Filled 2021-05-27: qty 1

## 2021-05-27 MED ORDER — ONDANSETRON HCL 4 MG/2ML IJ SOLN
4.0000 mg | Freq: Once | INTRAMUSCULAR | Status: AC
  Administered 2021-05-27: 4 mg via INTRAVENOUS

## 2021-05-27 MED ORDER — ROSUVASTATIN CALCIUM 20 MG PO TABS
20.0000 mg | ORAL_TABLET | Freq: Every day | ORAL | Status: DC
  Administered 2021-05-28 – 2021-05-29 (×2): 20 mg via ORAL
  Filled 2021-05-27 (×3): qty 1

## 2021-05-27 MED ORDER — HYDRALAZINE HCL 20 MG/ML IJ SOLN: 10.0000 mg | INTRAMUSCULAR | Status: AC | PRN

## 2021-05-27 MED ORDER — ASPIRIN 81 MG PO CHEW: 81.0000 mg | CHEWABLE_TABLET | Freq: Every day | ORAL | Status: DC

## 2021-05-27 MED ORDER — TIROFIBAN HCL IN NACL 5-0.9 MG/100ML-% IV SOLN
INTRAVENOUS | Status: AC | PRN
  Administered 2021-05-27: 0.15 ug/kg/min via INTRAVENOUS

## 2021-05-27 MED ORDER — TIROFIBAN (AGGRASTAT) BOLUS VIA INFUSION: 25.0000 ug/kg | Freq: Once | INTRAVENOUS | Status: DC

## 2021-05-27 MED ORDER — TIROFIBAN HCL IN NACL 5-0.9 MG/100ML-% IV SOLN
INTRAVENOUS | Status: AC
  Filled 2021-05-27: qty 100

## 2021-05-27 MED ORDER — SODIUM CHLORIDE 0.9 % IV SOLN: 250.0000 mL | INTRAVENOUS | Status: DC | PRN

## 2021-05-27 MED ORDER — TICAGRELOR 90 MG PO TABS
ORAL_TABLET | ORAL | Status: DC | PRN
  Administered 2021-05-27: 180 mg via ORAL

## 2021-05-27 MED ORDER — SODIUM CHLORIDE 0.9% FLUSH
3.0000 mL | Freq: Two times a day (BID) | INTRAVENOUS | Status: DC
  Administered 2021-05-27 – 2021-05-28 (×3): 3 mL via INTRAVENOUS

## 2021-05-27 MED ORDER — HEPARIN SODIUM (PORCINE) 5000 UNIT/ML IJ SOLN
60.0000 [IU]/kg | Freq: Once | INTRAMUSCULAR | Status: AC
  Administered 2021-05-27: 4000 [IU] via INTRAVENOUS

## 2021-05-27 MED ORDER — VERAPAMIL HCL 2.5 MG/ML IV SOLN
INTRAVENOUS | Status: DC | PRN
  Administered 2021-05-27: 10 mL via INTRA_ARTERIAL

## 2021-05-27 MED ORDER — LIDOCAINE HCL (PF) 1 % IJ SOLN
INTRAMUSCULAR | Status: AC
  Filled 2021-05-27: qty 30

## 2021-05-27 MED ORDER — NITROGLYCERIN 1 MG/10 ML FOR IR/CATH LAB
INTRA_ARTERIAL | Status: DC | PRN
  Administered 2021-05-27 (×3): 100 ug via INTRACORONARY

## 2021-05-27 MED ORDER — TIROFIBAN HCL IN NACL 5-0.9 MG/100ML-% IV SOLN
0.1500 ug/kg/min | INTRAVENOUS | Status: AC
  Administered 2021-05-27: 0.15 ug/kg/min via INTRAVENOUS
  Filled 2021-05-27 (×2): qty 100

## 2021-05-27 SURGICAL SUPPLY — 25 items
BALLN EMERGE MR 2.0X8 (BALLOONS) ×2
BALLN SAPPHIRE 2.0X12 (BALLOONS) ×2
BALLN SAPPHIRE 2.5X12 (BALLOONS) ×2
BALLN SAPPHIRE ~~LOC~~ 3.75X10 (BALLOONS) ×2 IMPLANT
BALLOON EMERGE MR 2.0X8 (BALLOONS) ×1 IMPLANT
BALLOON SAPPHIRE 2.0X12 (BALLOONS) ×1 IMPLANT
BALLOON SAPPHIRE 2.5X12 (BALLOONS) ×1 IMPLANT
CATH OPTITORQUE TIG 4.0 5F (CATHETERS) ×2 IMPLANT
CATH VISTA GUIDE 6FR XBLAD3.5 (CATHETERS) ×2 IMPLANT
DEVICE RAD COMP TR BAND LRG (VASCULAR PRODUCTS) ×2 IMPLANT
DEVICE TORQUE .014-.018 (MISCELLANEOUS) ×1 IMPLANT
GLIDESHEATH SLEND SS 6F .021 (SHEATH) ×2 IMPLANT
GUIDEWIRE INQWIRE 1.5J.035X260 (WIRE) ×1 IMPLANT
INQWIRE 1.5J .035X260CM (WIRE) ×2
KIT ENCORE 26 ADVANTAGE (KITS) ×2 IMPLANT
KIT HEART LEFT (KITS) ×2 IMPLANT
PACK CARDIAC CATHETERIZATION (CUSTOM PROCEDURE TRAY) ×2 IMPLANT
SHEATH PROBE COVER 6X72 (BAG) ×2 IMPLANT
STENT ONYX FRONTIER 3.5X15 (Permanent Stent) ×2 IMPLANT
TORQUE DEVICE .014-.018 (MISCELLANEOUS) ×2
TRANSDUCER W/STOPCOCK (MISCELLANEOUS) ×2 IMPLANT
TUBING CIL FLEX 10 FLL-RA (TUBING) ×2 IMPLANT
WIRE COUGAR XT STRL 190CM (WIRE) ×2 IMPLANT
WIRE HI TORQ WHISPER MS 190CM (WIRE) ×2 IMPLANT
WIRE PT2 MS 185 (WIRE) ×2 IMPLANT

## 2021-05-27 NOTE — ED Triage Notes (Signed)
Pt from home with ems for chest pain after mowing the yeard today. Pt given 1 nitro and 324 ASA en route.   BP 164/98 HR 80  Code stemi called upon arrival to ed

## 2021-05-27 NOTE — ED Provider Notes (Signed)
[facility] EMERGENCY DEPARTMENT Provider Note   CSN: [id number] Arrival date & time: 05/27/21  1629     History Chief Complaint  Patient presents with   Code STEMI    Brandon Pu. is a 55 y.o. male.  Patient with hx htn, presents with mid chest pain acute onset whiling mowing yard today shortly pta today. Dull pressure mid chest, severe, constant, non radiating, not pleuritic. No hx same pain. +sob, nausea, and diaphoresis. Father w hx cad/mi. Denies cough or uri symptoms. No fever or chills. No abd pain. No leg pain or swelling. No hx dvt or pe. No back/flank pain. EMS gave asa and ntg.   The history is provided by the patient and the EMS personnel.      Past Medical History:  Diagnosis Date   Arthritis    Benign essential HTN 02/02/2016   Chest pain 07/07/2015   Coronary artery calcification seen on CAT scan 02/02/2016   Excessive daytime sleepiness 07/07/2015   Hyperlipidemia with target LDL less than 70 02/02/2016   Hypertension    Morbid obesity (HCC) 07/07/2015   OSA (obstructive sleep apnea) 09/28/2015   Severe with AHI 47/hr with successful CPAP titration to 11cm H2O   Recurrent right inguinal hernia s/p lap repair with mesh 06/09/14 06/01/2014   Snoring 07/07/2015    Patient Active Problem List   Diagnosis Date Noted   Pain in right knee 06/10/2018   Coronary artery calcification seen on CAT scan 02/02/2016   Benign essential HTN 02/02/2016   Hyperlipidemia with target LDL less than 70 02/02/2016   OSA (obstructive sleep apnea) 09/28/2015   Chest pain 07/07/2015   Snoring 07/07/2015   Excessive daytime sleepiness 07/07/2015   Morbid obesity (HCC) 07/07/2015   Recurrent right inguinal hernia s/p lap repair with mesh 06/09/14 06/01/2014    Past Surgical History:  Procedure Laterality Date   INGUINAL HERNIA REPAIR Right 2010   VENTRAL HERNIA REPAIR     Repaired twice by Dr. Luretha Murphy       Family History  Problem Relation Age of Onset    Hyperlipidemia Father    Coronary artery disease Father 95   Diabetes Paternal Grandfather    Heart attack Paternal Grandmother    Coronary artery disease Paternal Grandmother     Social History   Tobacco Use   Smoking status: Never   Smokeless tobacco: Never  Vaping Use   Vaping Use: Never used  Substance Use Topics   Alcohol use: No   Drug use: No    Home Medications Prior to Admission medications   Medication Sig Start Date End Date Taking? Authorizing Provider  aspirin 81 MG tablet Take 81 mg by mouth daily.    [provider]  meloxicam (MOBIC) 15 MG tablet TAKE 1 TABLET BY MOUTH EVERY DAY 05/14/19   Vivi Barrack, DPM  methylPREDNISolone (MEDROL DOSEPAK) 4 MG TBPK tablet Take as directed 06/18/20   Vivi Barrack, DPM  OVER THE COUNTER MEDICATION Deep blue polyphenel complex    [provider]  OVER THE COUNTER MEDICATION xeo mega essential oil    [provider]  OVER THE COUNTER MEDICATION Micro plex vmx    [provider]  OVER THE COUNTER MEDICATION Alpha crst cellular vitality complex    [provider]  perindopril (ACEON) 4 MG tablet Take 4 mg by mouth daily.    [provider]  pravastatin (PRAVACHOL) 80 MG tablet TAKE 1 TABLET BY  MOUTH EVERY EVENING 07/18/19   Quintella Reichert, MD  tiZANidine (ZANAFLEX) 2 MG tablet tizanidine 2 mg tablet  TAKE 1/2-2 TABLETS AT BEDTIME AS NEEDED FOR MUSCLE SPASMS    [provider]  traMADol (ULTRAM) 50 MG tablet Take 50 mg by mouth every 6 (six) hours as needed for moderate pain. Reported on 02/02/2016    [provider]    Allergies    Lipitor [atorvastatin]  Review of Systems   Review of Systems  Constitutional:  Negative for fever.  HENT:  Negative for sore throat.   Eyes:  Negative for redness.  Respiratory:  Positive for shortness of breath. Negative for cough.   Cardiovascular:  Positive for chest pain. Negative for palpitations and leg  swelling.  Gastrointestinal:  Positive for nausea. Negative for abdominal pain.  Genitourinary:  Negative for flank pain.  Musculoskeletal:  Negative for back pain and neck pain.  Skin:  Negative for rash.  Neurological:  Negative for headaches.  Hematological:  Does not bruise/bleed easily.  Psychiatric/Behavioral:  Negative for confusion.    Physical Exam Updated Vital Signs BP (!) 164/111   Pulse 94   Temp 97.9 F (36.6 C) (Oral)   Resp 20   Ht 1.727 m (5\' 8" )   Wt 102.1 kg   SpO2 100%   BMI 34.21 kg/m   Physical Exam Vitals and nursing note reviewed.  Constitutional:      Appearance: Normal appearance. He is well-developed.  HENT:     Head: Atraumatic.     Nose: Nose normal.     Mouth/Throat:     Mouth: Mucous membranes are moist.     Pharynx: Oropharynx is clear.  Eyes:     General: No scleral icterus.    Conjunctiva/sclera: Conjunctivae normal.  Neck:     Trachea: No tracheal deviation.  Cardiovascular:     Rate and Rhythm: Normal rate and regular rhythm.     Pulses: Normal pulses.     Heart sounds: Normal heart sounds. No murmur heard.   No friction rub. No gallop.  Pulmonary:     Effort: Pulmonary effort is normal. No accessory muscle usage or respiratory distress.     Breath sounds: Normal breath sounds.  Chest:     Chest wall: No tenderness.  Abdominal:     General: Bowel sounds are normal. There is no distension.     Palpations: Abdomen is soft.     Tenderness: There is no abdominal tenderness. There is no guarding.  Genitourinary:    Comments: No cva tenderness. Musculoskeletal:        General: No swelling or tenderness.     Cervical back: Normal range of motion and neck supple. No rigidity.     Right lower leg: No edema.     Left lower leg: No edema.  Skin:    General: Skin is warm and dry.     Findings: No rash.  Neurological:     Mental Status: He is alert.     Comments: Alert, speech clear.   Psychiatric:     Comments: Anxious  appearing.     ED Results / Procedures / Treatments   Labs (all labs ordered are listed, but only abnormal results are displayed) Results for orders placed or performed in visit on 10/10/19  Novel Coronavirus, NAA ([facility])   Specimen: Nasopharyngeal(NP) swabs in vial transport medium   NASOPHARYNGE  TESTING  Result Value Ref Range   SARS-CoV-2, NAA Not Detected Not Detected  MR CERVICAL SPINE WO CONTRAST  Result Date: 05/26/2021 CLINICAL DATA:  Cervical radiculopathy M54.12 (ICD-10-CM). Additional history provided by scanning technologist: Patient reports posterior pain shooting to right shoulder intermittently for 2 years, swelling in hands. EXAM: MRI CERVICAL SPINE WITHOUT CONTRAST TECHNIQUE: Multiplanar, multisequence MR imaging of the cervical spine was performed. No intravenous contrast was administered. COMPARISON:  Radiographs of the cervical spine 02/18/2021. MRI of the cervical spine 10/12/2019. FINDINGS: Alignment: Trace C3-C4 grade 1 retrolisthesis. Vertebrae: Vertebral body height is maintained. No significant marrow edema or focal suspicious osseous lesion. Cord: No spinal cord signal abnormality is identified. Posterior Fossa, vertebral arteries, paraspinal tissues: No abnormality identified within included portions of the posterior fossa. Flow voids preserved within the imaged cervical vertebral arteries. Paraspinal soft tissues within normal limits. Disc levels: Unless otherwise stated, the level by level findings below have not significantly changed from the prior MRI of 10/12/2019. No more than mild disc degeneration at any level. C2-C3: No significant disc herniation or stenosis. C3-C4: Trace grade 1 anterolisthesis. Disc bulge. Bilateral uncovertebral hypertrophy. Facet arthrosis. No significant spinal canal stenosis. Mild right neural foraminal narrowing, new from the prior exam. C4-C5: No significant disc herniation or spinal canal stenosis. Mild-to-moderate facet arthrosis.  Mild relative right neural foraminal narrowing. C5-C6: Shallow broad-based central disc protrusion. Uncovertebral hypertrophy on the right. Facet arthrosis with mild ligamentum flavum hypertrophy. Mild partial effacement of the ventral thecal sac without spinal cord mass effect. Moderate right neural foraminal narrowing. C6-C7: Shallow disc bulge. Minimal facet arthrosis. No significant spinal canal or foraminal stenosis. C7-T1: No significant disc herniation or stenosis. IMPRESSION: Comparison is made to the prior cervical spine MRI of 10/12/2019. Mild right C3-C4 neural foraminal narrowing is new from the prior exam. Cervical spondylosis, as outlined and otherwise unchanged. No more than mild relative spinal canal narrowing (without spinal cord mass effect). Moderate neural foraminal narrowing on the right at C5-C6. Mild relative neural foraminal narrowing on the right at C4-C5. Electronically Signed   By: Jackey LogeKyle  Golden DO   On: 05/26/2021 12:09    EKG EKG Interpretation  Date/Time:  Friday May 27 2021 16:29:34 EDT Ventricular Rate:  92 PR Interval:  137 QRS Duration: 90 QT Interval:  369 QTC Calculation: 457 R Axis:   84 Text Interpretation: Sinus rhythm Anterior infarct, possibly acute >>> Acute MI <<< Confirmed by Cathren LaineSteinl, Opel Lejeune ([id number]) on 05/27/2021 4:33:31 PM  Radiology No results found.  Procedures Procedures   Medications Ordered in ED Medications  0.9 %  sodium chloride infusion ( Intravenous New Bag/Given 05/27/21 1639)  aspirin chewable tablet 324 mg (324 mg Oral Not Given 05/27/21 1633)  nitroGLYCERIN (NITROSTAT) SL tablet 0.4 mg (has no administration in time range)  nitroGLYCERIN (NITROSTAT) SL tablet 0.4 mg (has no administration in time range)  heparin injection 60 Units/kg (4,000 Units Intravenous Given 05/27/21 1637)  morphine 4 MG/ML injection 4 mg (4 mg Intravenous Given 05/27/21 1641)  ondansetron (ZOFRAN) injection 4 mg (4 mg Intravenous Given 05/27/21 1640)    ED  Course  I have reviewed the triage vital signs and the nursing notes.  Pertinent labs & imaging results that were available during my care of the patient were reviewed by me and considered in my medical decision making (see chart for details).    MDM Rules/Calculators/A&P                          Iv ns. Continuous pulse ox and cardiac monitoring. Stat  labs and imaging. Ecg.   Upon seeing EMS ECG, code stemi activated and cardiology emergently consulted - discussed w Drs Tresa Endo and Croitoru who is at bedside.   CXR reviewed/interpreted by me - no pna.   Pt given asa already. Heparin per pharmacy.   Pain persists, awaiting cath labs. Ntg sl. Morphine iv. Zofran iv.   Patient  taken emergently to cath lab.   CRITICAL CARE RE: ACS/nstemi, cath lab activition Performed by: Suzi Roots Total critical care time: 35 minutes Critical care time was exclusive of separately billable procedures and treating other patients. Critical care was necessary to treat or prevent imminent or life-threatening deterioration. Critical care was time spent personally by me on the following activities: development of treatment plan with patient and/or surrogate as well as nursing, discussions with consultants, evaluation of patient's response to treatment, examination of patient, obtaining history from patient or surrogate, ordering and performing treatments and interventions, ordering and review of laboratory studies, ordering and review of radiographic studies, pulse oximetry and re-evaluation of patient's condition.    Final Clinical Impression(s) / ED Diagnoses Final diagnoses:  None    Rx / DC Orders ED Discharge Orders     None        Cathren Laine, MD 05/27/21 [phone]

## 2021-05-27 NOTE — ED Notes (Signed)
Code Stemi activated per PA [facility]

## 2021-05-27 NOTE — H&P (Signed)
Cardiology Admission History and Physical:   Patient ID: Brandon Villanueva. MRN: [id number]; DOB: [birth year]   Admission date: 05/27/2021  PCP:  Johny Blamer, MD   [facility] Providers Cardiologist:  Armanda Magic, MD   {   Chief Complaint:  chest pain  Patient Profile:   Brandon Villanueva. is a 55 y.o. male with hypertension, hyperlipidemia, OSA on CPAP, who is being seen 05/27/2021 for the evaluation of STEMI.   History of Present Illness:   Brandon Villanueva follows Dr. Mayford Knife outpatient.  He had history of atypical chest pain with abnormal exercise tolerance test in the past.  He had a coronary CTA in 2013, which revealed atherosclerotic coronary artery disease involving the left anterior descending artery with no significant stenosis.  2016.  He underwent a coronary calcium score which revealed score of 0.7, trivial focal calcification in the LAD.  2018, he underwent exercise Myoview which showed positive EKG with 2 mm horizontal ST segment depression in lateral leads and hypertension during exercise.  EF was 49% with apical hypokinesis, however no ischemia was noted on nuclear imaging.  He has been managed medically with aspirin, ACE, statin. He was last seen in the office on 04/27/2020, denied any angina symptoms, compliant with medication and CPAP device.  Patient presented to the ER today with complaint of midsternal chest pain.  Pain is acute in onset while mowing the yard.  It is dull in quality, severe, constant, nonradiating, not pleuritic in nature.  He reports associated shortness of breath, nausea, diaphoresis.  He called EMS, EKG at the scene concerning for ST elevation, and subsequently he is transferred over here for evaluation.  Upon ER arrival, he is afebrile, apical 94, respiration 20, blood pressure 164/111, pulse ox 100% on room air.  He was given aspirin 324 mg x 1, nitroglycerin 0.4 mg x 1, Zofran 4 mg x 1, morphine 4 mg x 1, and heparin bolus.      Past Medical  History:  Diagnosis Date   Arthritis    Benign essential HTN 02/02/2016   Chest pain 07/07/2015   Coronary artery calcification seen on CAT scan 02/02/2016   Excessive daytime sleepiness 07/07/2015   Hyperlipidemia with target LDL less than 70 02/02/2016   Hypertension    Morbid obesity (HCC) 07/07/2015   OSA (obstructive sleep apnea) 09/28/2015   Severe with AHI 47/hr with successful CPAP titration to 11cm H2O   Recurrent right inguinal hernia s/p lap repair with mesh 06/09/14 06/01/2014   Snoring 07/07/2015    Past Surgical History:  Procedure Laterality Date   INGUINAL HERNIA REPAIR Right 2010   VENTRAL HERNIA REPAIR     Repaired twice by Dr. Luretha Murphy     Medications Prior to Admission: Prior to Admission medications   Medication Sig Start Date End Date Taking? Authorizing Provider  aspirin 81 MG tablet Take 81 mg by mouth daily.    [provider]  meloxicam (MOBIC) 15 MG tablet TAKE 1 TABLET BY MOUTH EVERY DAY 05/14/19   Vivi Barrack, DPM  methylPREDNISolone (MEDROL DOSEPAK) 4 MG TBPK tablet Take as directed 06/18/20   Vivi Barrack, DPM  OVER THE COUNTER MEDICATION Deep blue polyphenel complex    [provider]  OVER THE COUNTER MEDICATION xeo mega essential oil    [provider]  OVER THE COUNTER MEDICATION Micro plex vmx    [provider]  OVER THE COUNTER MEDICATION Alpha crst cellular vitality complex  [provider]  perindopril (ACEON) 4 MG tablet Take 4 mg by mouth daily.    [provider]  pravastatin (PRAVACHOL) 80 MG tablet TAKE 1 TABLET BY MOUTH EVERY EVENING 07/18/19   Turner, Cornelious Bryant, MD  tiZANidine (ZANAFLEX) 2 MG tablet tizanidine 2 mg tablet  TAKE 1/2-2 TABLETS AT BEDTIME AS NEEDED FOR MUSCLE SPASMS    [provider]  traMADol (ULTRAM) 50 MG tablet Take 50 mg by mouth every 6 (six) hours as needed for moderate pain. Reported on 02/02/2016    [provider]     Allergies:     Allergies  Allergen Reactions   Lipitor [Atorvastatin] Other (See Comments)    Muscle aches    Social History:   Social History   Socioeconomic History   Marital status: Married    Spouse name: Not on file   Number of children: Not on file   Years of education: Not on file   Highest education level: Not on file  Occupational History   Not on file  Tobacco Use   Smoking status: Never   Smokeless tobacco: Never  Vaping Use   Vaping Use: Never used  Substance and Sexual Activity   Alcohol use: No   Drug use: No   Sexual activity: Not on file  Other Topics Concern   Not on file  Social History Narrative   Not on file   Social Determinants of Health   Financial Resource Strain: Not on file  Food Insecurity: Not on file  Transportation Needs: Not on file  Physical Activity: Not on file  Stress: Not on file  Social Connections: Not on file  Intimate Partner Violence: Not on file    Family History:   The patient's family history includes Coronary artery disease in his paternal grandmother; Coronary artery disease (age of onset: 75) in his father; Diabetes in his paternal grandfather; Heart attack in his paternal grandmother; Hyperlipidemia in his father.    ROS:  Please see the history of present illness.  Abrupt onset of severe crushing chest pain, dyspnea and profuse diaphoresis roughly one hour before arrival to ED. All other ROS reviewed and negative.     Physical Exam/Data:   Vitals:   05/27/21 1631 05/27/21 1632 05/27/21 1633 05/27/21 1655  BP: (!) 164/111     Pulse: 94     Resp: 20     Temp:   97.9 F (36.6 C)   TempSrc:   Oral   SpO2: 100%   100%  Weight:  102.1 kg    Height:  5\' 8"  (1.727 m)     No intake or output data in the 24 hours ending 05/27/21 1703 Last 3 Weights 05/27/2021 04/27/2020 04/24/2018  Weight (lbs) 225 lb 220 lb 230 lb 12.8 oz  Weight (kg) 102.059 kg 99.791 kg 104.69 kg     Body mass index is 34.21 kg/m.   General:  Well  nourished, well developed, in acute distress, diaphoretic HEENT: normal Lymph: no adenopathy Neck: no JVD Endocrine:  No thryomegaly Vascular: No carotid bruits; FA pulses 2+ bilaterally without bruits  Cardiac:  normal S1, S2; RRR; no murmur  Lungs:  clear to auscultation bilaterally, no wheezing, rhonchi or rales  Abd: soft, nontender, no hepatomegaly  Ext: no edema Musculoskeletal:  No deformities, BUE and BLE strength normal and equal Skin: warm and dry  Neuro:  CNs 2-12 intact, no focal abnormalities noted Psych:  Normal affect    EKG:  EKG reveals  sinus rhythm with ventricular rate of 92 bpm, ST elevation of V1-3, ST depression of inferior lateral leads.  Relevant CV Studies:  Echo 08/12/2017:  - Left ventricle: The cavity size was normal. Wall thickness was    normal. Systolic function was normal. The estimated ejection    fraction was in the range of 60% to 65%. Wall motion was normal;    there were no regional wall motion abnormalities. Left    ventricular diastolic function parameters were normal.   Stress Myoview from 08/13/2017:  Nuclear stress EF: 49%. Horizontal ST segment depression ST segment depression of 2 mm was noted during stress. Blood pressure demonstrated a hypertensive response to exercise. This is an intermediate risk study. The left ventricular ejection fraction is mildly decreased (45-54%).   No ischemia Soft tissue attenuation of the anterior apex on resting images EF estimated at 49% with apical hypokinesis Marked positive ECG response with 2 mm horizontal ST segment depression in lateral leads HTN response to exercise Suggest further f/u    Laboratory Data:  High Sensitivity Troponin:  No results for input(s): TROPONINIHS in the last 720 hours.    ChemistryNo results for input(s): NA, K, CL, CO2, GLUCOSE, BUN, CREATININE, CALCIUM, GFRNONAA, GFRAA, ANIONGAP in the last 168 hours.  No results for input(s): PROT, ALBUMIN, AST, ALT, ALKPHOS,  BILITOT in the last 168 hours. HematologyNo results for input(s): WBC, RBC, HGB, HCT, MCV, MCH, MCHC, RDW, PLT in the last 168 hours. BNPNo results for input(s): BNP, PROBNP in the last 168 hours.  DDimer No results for input(s): DDIMER in the last 168 hours.   Radiology/Studies:  DG Chest Port 1 View  Result Date: 05/27/2021 CLINICAL DATA:  Chest pain EXAM: PORTABLE CHEST 1 VIEW COMPARISON:  04/19/2015 FINDINGS: Cardiomegaly. Both lungs are clear. The visualized skeletal structures are unremarkable. IMPRESSION: Cardiomegaly without acute abnormality of the lungs in AP portable projection. Electronically Signed   By: Lauralyn Primes M.D.   On: 05/27/2021 16:45     Assessment and Plan:   STEMI Coronary Artery calcification -Presented with chest pain while mowing the lawn -EKG with ST elevation of V1-V3, ST depression of inferolateral leads - s/p ASA, heparin bolus  -STEMI code is called, taken urgently for Cath Lab - will repeat echocardiogram and obtain admission labs - Admission orders per post Cath Lab -Resume aspirin and statin  Hypertension -Blood pressure is currently elevated, resume Perindopril 4mg  daily post cath if no contraindication  Hyperlipidemia -Update lipid profile, continue statin  CPAP -will need to resume CPAP at night    Risk Assessment/Risk Scores:   TIMI Risk Score for ST  Elevation MI:   The patient's TIMI risk score is 5, which indicates a 12.4% risk of all cause mortality at 30 days. {   Severity of Illness: The appropriate patient status for this patient is OBSERVATION. Observation status is judged to be reasonable and necessary in order to provide the required intensity of service to ensure the patient's safety. The patient's presenting symptoms, physical exam findings, and initial radiographic and laboratory data in the context of their medical condition is felt to place them at decreased risk for further clinical deterioration. Furthermore, it is  anticipated that the patient will be medically stable for discharge from the hospital within 2 midnights of admission. The following factors support the patient status of observation.   " The patient's presenting symptoms include chest pain " The physical exam findings include N/A " The initial radiographic and laboratory data are  STEMI.   For questions or updates, please contact [facility] Please consult [email or web address] for contact info under     Signed, Cyndi BenderXika Zhao, NP  05/27/2021 5:03 PM    I have seen and examined the patient along with  Cyndi BenderXika Zhao, NP . I have reviewed the chart, notes and new data.  I agree with PA/NP's note.  Key new complaints: ongoing crushing retrosternal pain Key examination changes: S4 present, RRR, clear lungs, diaphoresis Key new findings / data: ECG c/w anterior STEMI due to proximal LAD stenosis or even left main disease  PLAN: Emergency cardiac catheterization and revascularization as appropriate. This procedure has been fully reviewed with the patient and written informed consent has been obtained.Thurmon Fair.  Nysia Dell, MD, [facility] [phone] 05/27/2021, 6:03 PM

## 2021-05-27 NOTE — Plan of Care (Signed)
  Problem: Education: Goal: Knowledge of General Education information will improve Description: Including pain rating scale, medication(s)/side effects and non-pharmacologic comfort measures 05/27/2021 2052 by Wyvonnia Dusky, RN Outcome: Progressing 05/27/2021 2052 by Wyvonnia Dusky, RN Outcome: Progressing   Problem: Health Behavior/Discharge Planning: Goal: Ability to manage health-related needs will improve 05/27/2021 2052 by Wyvonnia Dusky, RN Outcome: Progressing 05/27/2021 2052 by Wyvonnia Dusky, RN Outcome: Progressing   Problem: Clinical Measurements: Goal: Ability to maintain clinical measurements within normal limits will improve 05/27/2021 2052 by Wyvonnia Dusky, RN Outcome: Progressing 05/27/2021 2052 by Wyvonnia Dusky, RN Outcome: Progressing Goal: Will remain free from infection 05/27/2021 2052 by Wyvonnia Dusky, RN Outcome: Progressing 05/27/2021 2052 by Wyvonnia Dusky, RN Outcome: Progressing Goal: Diagnostic test results will improve 05/27/2021 2052 by Wyvonnia Dusky, RN Outcome: Progressing 05/27/2021 2052 by Wyvonnia Dusky, RN Outcome: Progressing Goal: Respiratory complications will improve 05/27/2021 2052 by Wyvonnia Dusky, RN Outcome: Progressing 05/27/2021 2052 by Wyvonnia Dusky, RN Outcome: Progressing Goal: Cardiovascular complication will be avoided 05/27/2021 2052 by Wyvonnia Dusky, RN Outcome: Progressing 05/27/2021 2052 by Wyvonnia Dusky, RN Outcome: Progressing   Problem: Activity: Goal: Risk for activity intolerance will decrease 05/27/2021 2052 by Wyvonnia Dusky, RN Outcome: Progressing 05/27/2021 2052 by Wyvonnia Dusky, RN Outcome: Progressing   Problem: Nutrition: Goal: Adequate nutrition will be maintained 05/27/2021 2052 by Wyvonnia Dusky, RN Outcome: Progressing 05/27/2021 2052  by Wyvonnia Dusky, RN Outcome: Progressing   Problem: Elimination: Goal: Will not experience complications related to bowel motility 05/27/2021 2052 by Wyvonnia Dusky, RN Outcome: Progressing 05/27/2021 2052 by Wyvonnia Dusky, RN Outcome: Progressing Goal: Will not experience complications related to urinary retention 05/27/2021 2052 by Wyvonnia Dusky, RN Outcome: Progressing 05/27/2021 2052 by Wyvonnia Dusky, RN Outcome: Progressing   Problem: Pain Managment: Goal: General experience of comfort will improve 05/27/2021 2052 by Wyvonnia Dusky, RN Outcome: Progressing 05/27/2021 2052 by Wyvonnia Dusky, RN Outcome: Progressing   Problem: Safety: Goal: Ability to remain free from injury will improve 05/27/2021 2052 by Wyvonnia Dusky, RN Outcome: Progressing 05/27/2021 2052 by Wyvonnia Dusky, RN Outcome: Progressing

## 2021-05-28 ENCOUNTER — Inpatient Hospital Stay (HOSPITAL_COMMUNITY): Payer: BC Managed Care – PPO

## 2021-05-28 ENCOUNTER — Other Ambulatory Visit: Payer: Self-pay

## 2021-05-28 ENCOUNTER — Encounter (HOSPITAL_COMMUNITY): Payer: Self-pay | Admitting: Cardiovascular Disease

## 2021-05-28 DIAGNOSIS — R079 Chest pain, unspecified: Secondary | ICD-10-CM

## 2021-05-28 LAB — BASIC METABOLIC PANEL
Anion gap: 7 (ref 5–15)
BUN: 15 mg/dL (ref 6–20)
CO2: 23 mmol/L (ref 22–32)
Calcium: 8.3 mg/dL — ABNORMAL LOW (ref 8.9–10.3)
Chloride: 107 mmol/L (ref 98–111)
Creatinine, Ser: 0.83 mg/dL (ref 0.61–1.24)
GFR, Estimated: >60 mL/min (ref >60)
Glucose, Bld: 117 mg/dL — ABNORMAL HIGH (ref 70–99)
Potassium: 4.3 mmol/L (ref 3.5–5.1)
Sodium: 137 mmol/L (ref 135–145)

## 2021-05-28 LAB — CBC
HCT: 39.3 % (ref 39.0–52.0)
Hemoglobin: 13 g/dL (ref 13.0–17.0)
MCH: 30.2 pg (ref 26.0–34.0)
MCHC: 33.1 g/dL (ref 30.0–36.0)
MCV: 91.2 fL (ref 80.0–100.0)
Platelets: 195 10*3/uL (ref 150–400)
RBC: 4.31 MIL/uL (ref 4.22–5.81)
RDW: 12.7 % (ref 11.5–15.5)
WBC: 10.1 10*3/uL (ref 4.0–10.5)
nRBC: 0 % (ref 0.0–0.2)

## 2021-05-28 LAB — ECHOCARDIOGRAM LIMITED
Height: 68 in
S' Lateral: 3.5 cm
Weight: 3509.72 oz

## 2021-05-28 MED ORDER — CHLORHEXIDINE GLUCONATE CLOTH 2 % EX PADS: 6.0000 | MEDICATED_PAD | Freq: Every day | CUTANEOUS | Status: DC

## 2021-05-28 MED ORDER — PERFLUTREN LIPID MICROSPHERE
1.0000 mL | INTRAVENOUS | Status: AC | PRN
  Filled 2021-05-28: qty 10

## 2021-05-28 MED ORDER — CARVEDILOL 6.25 MG PO TABS
6.2500 mg | ORAL_TABLET | Freq: Two times a day (BID) | ORAL | Status: DC
  Administered 2021-05-28 – 2021-05-29 (×2): 6.25 mg via ORAL
  Filled 2021-05-28 (×2): qty 1

## 2021-05-28 MED ORDER — PERFLUTREN LIPID MICROSPHERE
INTRAVENOUS | Status: AC
  Administered 2021-05-28: 3 mL via INTRAVENOUS
  Filled 2021-05-28: qty 10

## 2021-05-28 NOTE — Progress Notes (Addendum)
Progress Note  Patient Name: Brandon Villanueva. Date of Encounter: 05/28/2021  Primary Cardiologist: Armanda Magic, MD   Subjective   Feeling well. Had some heartburn like sensation after receiving potassium.   Inpatient Medications    Scheduled Meds:  aspirin  324 mg Oral Once   aspirin  81 mg Oral Daily   carvedilol  3.125 mg Oral BID WC   nitroGLYCERIN  0.4 mg Sublingual Once   potassium chloride  40 mEq Oral BID   rosuvastatin  20 mg Oral Daily   sodium chloride flush  3 mL Intravenous Q12H   ticagrelor  90 mg Oral BID   Continuous Infusions:  sodium chloride 10 mL/hr at 05/27/21 1732   sodium chloride     tirofiban Stopped (05/28/21 0600)   PRN Meds: sodium chloride, acetaminophen, diazepam, nitroGLYCERIN, ondansetron (ZOFRAN) IV, sodium chloride flush   Vital Signs    Vitals:   05/28/21 0515 05/28/21 0530 05/28/21 0545 05/28/21 0600  BP:  136/87    Pulse:      Resp: 17 15 16 15   Temp:      TempSrc:      SpO2: 100% 100% 100% 100%  Weight:      Height:        Intake/Output Summary (Last 24 hours) at 05/28/2021 0706 Last data filed at 05/28/2021 0400 Gross per 24 hour  Intake 607.01 ml  Output 695 ml  Net -87.99 ml   Filed Weights   05/27/21 1632 05/28/21 0500  Weight: 102.1 kg 99.5 kg    Telemetry    SR with frequent but brief AIVR and NSVT- Personally Reviewed  ECG    NSR, nonspecific T wave abnl. - Personally Reviewed  Physical Exam   GEN: No acute distress.   Neck: No JVD Cardiac: regular rhythm, normal rate, no murmurs, rubs, or gallops. R radial TR band still in place, with 4/4 proximal radial pulse Respiratory: Clear to auscultation bilaterally. GI: Soft, nontender, non-distended  MS: No edema; No deformity. Neuro:  Nonfocal  Psych: Normal affect   Labs    Chemistry Recent Labs  Lab 05/27/21 1630 05/27/21 1723 05/28/21 0314  NA 141 135 137  K 3.4* 2.8* 4.3  CL 107 101 107  CO2 20*  --  23  GLUCOSE 156* 179* 117*  BUN 19  18 15   CREATININE 1.02 0.60* 0.83  CALCIUM 10.1  --  8.3*  PROT 7.3  --   --   ALBUMIN 4.3  --   --   AST 35  --   --   ALT 43  --   --   ALKPHOS 80  --   --   BILITOT 1.0  --   --   GFRNONAA >60  --  >60  ANIONGAP 14  --  7     Hematology Recent Labs  Lab 05/27/21 1630 05/27/21 1723 05/28/21 0314  WBC 11.3*  --  10.1  RBC 5.17  --  4.31  HGB 15.5 14.6 13.0  HCT 46.1 43.0 39.3  MCV 89.2  --  91.2  MCH 30.0  --  30.2  MCHC 33.6  --  33.1  RDW 12.4  --  12.7  PLT 303  --  195    Cardiac EnzymesNo results for input(s): TROPONINI in the last 168 hours. No results for input(s): TROPIPOC in the last 168 hours.   BNPNo results for input(s): BNP, PROBNP in the last 168 hours.   DDimer No results for  input(s): DDIMER in the last 168 hours.   Radiology    CARDIAC CATHETERIZATION  Result Date: 05/27/2021 Formatting of this result is different from the original.   Lat 1st Diag lesion is 50% stenosed.   Mid LAD lesion is 50% stenosed.   1st Diag lesion is 60% stenosed.   Mid LAD to Dist LAD lesion is 40% stenosed.   RPAV lesion is 80% stenosed.   Ost LAD to Prox LAD lesion is 100% stenosed.   A drug-eluting stent was successfully placed.   Post intervention, there is a 0% residual stenosis. Acute ST segment elevation anterior wall myocardial infarction secondary to total occlusion of the LAD at its ostium with TIMI 0 flow and no evidence for collaterals. Mild narrowing of 25% in the moderate-sized bifurcating ramus intermediate vessel. Normal left circumflex coronary artery. Dominant RCA with focal 80% stenosis in the continuation branch just after the PDA takeoff. Difficult but successful PCI of the LAD ostium with ultimate insertion of a 3.5 x 15 mm Resolute Frontier DES stent postdilated to 3.80 mm with the100% occlusion being reduced to 0% and TIMI 0 flow improved to TIMI-3 flow.  Once the LAD was open, there is evidence for 60% stenosis at the ostium of the diagonal vessel and diffuse  irregularity of 40% in the mid LAD. Elevated LVEDP at . RECOMMENDATION: DAPT for minimum of 1 year. Medical therapy for concomitant CAD with possible need for intervention to the distal RCA.  A 2D echo Doppler study will be ordered for assessment of LV function with hopeful improvement and salvage of myocardium with time.  Recommend more aggressive lipid therapy and suggest changing pravastatin to rosuvastatin in this patient intolerant to previous atorvastatin.  Plan to initiate carvedilol and possible ARB therapy in a.m. as blood pressure and heart rate allows.   DG Chest Port 1 View  Result Date: 05/27/2021 CLINICAL DATA:  Chest pain EXAM: PORTABLE CHEST 1 VIEW COMPARISON:  04/19/2015 FINDINGS: Cardiomegaly. Both lungs are clear. The visualized skeletal structures are unremarkable. IMPRESSION: Cardiomegaly without acute abnormality of the lungs in AP portable projection. Electronically Signed   By: Lauralyn Primes M.D.   On: 05/27/2021 16:45    Cardiac Studies   Echo pending today.   Cath 05/27/21: Lat 1st Diag lesion is 50% stenosed.   Mid LAD lesion is 50% stenosed.   1st Diag lesion is 60% stenosed.   Mid LAD to Dist LAD lesion is 40% stenosed.   RPAV lesion is 80% stenosed.   Ost LAD to Prox LAD lesion is 100% stenosed.   A drug-eluting stent was successfully placed.   Post intervention, there is a 0% residual stenosis.   Acute ST segment elevation anterior wall myocardial infarction secondary to total occlusion of the LAD at its ostium with TIMI 0 flow and no evidence for collaterals.   Mild narrowing of 25% in the moderate-sized bifurcating ramus intermediate vessel.   Normal left circumflex coronary artery.   Dominant RCA with focal 80% stenosis in the continuation branch just after the PDA takeoff.   Difficult but successful PCI of the LAD ostium with ultimate insertion of a 3.5 x 15 mm Resolute Frontier DES stent postdilated to 3.80 mm with the100% occlusion being reduced to  0% and TIMI 0 flow improved to TIMI-3 flow.  Once the LAD was open, there is evidence for 60% stenosis at the ostium of the diagonal vessel and diffuse irregularity of 40% in the mid LAD.   Elevated LVEDP at  .   RECOMMENDATION: DAPT for minimum of 1 year. Medical therapy for concomitant CAD with possible need for intervention to the distal RCA.  A 2D echo Doppler study will be ordered for assessment of LV function with hopeful improvement and salvage of myocardium with time.  Recommend more aggressive lipid therapy and suggest changing pravastatin to rosuvastatin in this patient intolerant to previous atorvastatin.  Plan to initiate carvedilol and possible ARB therapy in a.m. as blood pressure and heart rate allows.  Patient Profile     55 y.o. male  hypertension, hyperlipidemia, OSA on CPAP, here for STEMI with PCI to ostial LAD and residual dominant RCA distal 80 % stenosis.   Assessment & Plan   Active Problems:   ACS (acute coronary syndrome) (HCC)   ST elevation myocardial infarction involving left anterior descending (LAD) coronary artery (HCC)   STEMI involving left anterior descending coronary artery (HCC)   Anterior STEMI - Labs stable. - ASA 81 mg daily and ticagrelor 90 mg BID - crestor 20 mg daily - carvedilol 3125 mg BID - titrate up to 6.25 mg BID due to NSVT - will decide on ACE/ARB/ARNI after echo formally interpreted. Brief review suggests EF 45-50% with apical and mid anteroseptal hypokinesis.  - K normal today, stop supplementation.  Transfer to tele floor bed today and dismissal anticipated early AM tomorrow.   For questions or updates, please contact [facility] Please consult [email or web address] for contact info under        Signed, Parke Poisson, MD  05/28/2021, 7:06 AM

## 2021-05-28 NOTE — Progress Notes (Signed)
Patient ambulated four small laps in room with RN. Patient states he has no chest pain.Vitals, HR and BP remained stable. Oozing from right radial stopped, 2cc air removed from TR band. Will continue to monitor patient.

## 2021-05-28 NOTE — Progress Notes (Signed)
Unfortunately pt is on covid precautions therefore we will not see in person. I asked RN to have him ambulate in room.   Called pt in room and discussed ed with pt and wife. Discussed MI, stent, Brilinta, restrictions, diet, exercise, NTG and CRPII. Pt receptive, all questions answered so far. Will refer to [place] CRPII. He understands the importance of Brilinta. Gave them materials to read and videos to view.  [id number] Ethelda Chick CES, ACSM 12:29 PM 05/28/2021

## 2021-05-28 NOTE — Progress Notes (Signed)
Echocardiogram 2D Echocardiogram has been performed.  Warren Lacy Kaelynne Christley RDCS 05/28/2021, 8:49 AM

## 2021-05-28 NOTE — Progress Notes (Signed)
TR band removed and oozing found around site. Dr. Jacques Navy paged and made aware. Compression dressing applied per MD. No signs of hematoma and patient has palpable pulses. Passed information to 6E RN to continue to monitor site.

## 2021-05-29 ENCOUNTER — Encounter (HOSPITAL_COMMUNITY): Payer: Self-pay | Admitting: Cardiovascular Disease

## 2021-05-29 DIAGNOSIS — I255 Ischemic cardiomyopathy: Secondary | ICD-10-CM

## 2021-05-29 DIAGNOSIS — U071 COVID-19: Secondary | ICD-10-CM

## 2021-05-29 LAB — CBC WITH DIFFERENTIAL/PLATELET
Abs Immature Granulocytes: 0.03 10*3/uL (ref 0.00–0.07)
Basophils Absolute: 0.1 10*3/uL (ref 0.0–0.1)
Basophils Relative: 1 %
Eosinophils Absolute: 0.2 10*3/uL (ref 0.0–0.5)
Eosinophils Relative: 2 %
HCT: 45.6 % (ref 39.0–52.0)
Hemoglobin: 15.1 g/dL (ref 13.0–17.0)
Immature Granulocytes: 0 %
Lymphocytes Relative: 24 %
Lymphs Abs: 2 10*3/uL (ref 0.7–4.0)
MCH: 30 pg (ref 26.0–34.0)
MCHC: 33.1 g/dL (ref 30.0–36.0)
MCV: 90.5 fL (ref 80.0–100.0)
Monocytes Absolute: 0.6 10*3/uL (ref 0.1–1.0)
Monocytes Relative: 8 %
Neutro Abs: 5.4 10*3/uL (ref 1.7–7.7)
Neutrophils Relative %: 65 %
Platelets: 242 10*3/uL (ref 150–400)
RBC: 5.04 MIL/uL (ref 4.22–5.81)
RDW: 12.7 % (ref 11.5–15.5)
WBC: 8.4 10*3/uL (ref 4.0–10.5)
nRBC: 0 % (ref 0.0–0.2)

## 2021-05-29 LAB — BASIC METABOLIC PANEL
Anion gap: 7 (ref 5–15)
BUN: 12 mg/dL (ref 6–20)
CO2: 25 mmol/L (ref 22–32)
Calcium: 9.1 mg/dL (ref 8.9–10.3)
Chloride: 103 mmol/L (ref 98–111)
Creatinine, Ser: 0.86 mg/dL (ref 0.61–1.24)
GFR, Estimated: >60 mL/min (ref >60)
Glucose, Bld: 119 mg/dL — ABNORMAL HIGH (ref 70–99)
Potassium: 4 mmol/L (ref 3.5–5.1)
Sodium: 135 mmol/L (ref 135–145)

## 2021-05-29 MED ORDER — ROSUVASTATIN CALCIUM 20 MG PO TABS: 20.0000 mg | ORAL_TABLET | Freq: Every day | ORAL | 6 refills | Status: DC

## 2021-05-29 MED ORDER — SACUBITRIL-VALSARTAN 24-26 MG PO TABS: 1.0000 | ORAL_TABLET | Freq: Two times a day (BID) | ORAL | 6 refills | Status: DC

## 2021-05-29 MED ORDER — NITROGLYCERIN 0.4 MG SL SUBL: 0.4000 mg | SUBLINGUAL_TABLET | SUBLINGUAL | 3 refills | Status: AC | PRN

## 2021-05-29 MED ORDER — LIVING BETTER WITH HEART FAILURE BOOK: Freq: Once | Status: AC

## 2021-05-29 MED ORDER — NITROGLYCERIN 0.4 MG SL SUBL: 0.4000 mg | SUBLINGUAL_TABLET | SUBLINGUAL | 3 refills | Status: DC | PRN

## 2021-05-29 MED ORDER — CARVEDILOL 6.25 MG PO TABS: 6.2500 mg | ORAL_TABLET | Freq: Two times a day (BID) | ORAL | 6 refills | Status: DC

## 2021-05-29 MED ORDER — SACUBITRIL-VALSARTAN 24-26 MG PO TABS
1.0000 | ORAL_TABLET | Freq: Two times a day (BID) | ORAL | Status: DC
  Administered 2021-05-29: 1 via ORAL
  Filled 2021-05-29: qty 1

## 2021-05-29 MED ORDER — TICAGRELOR 90 MG PO TABS: 90.0000 mg | ORAL_TABLET | Freq: Two times a day (BID) | ORAL | 11 refills | Status: DC

## 2021-05-29 NOTE — Progress Notes (Signed)
Progress Note  Patient Name: Brandon Villanueva. Date of Encounter: 05/29/2021  Primary Cardiologist: Armanda Magic, MD   Subjective   No CP. Ready to go home.  Inpatient Medications    Scheduled Meds:  aspirin  324 mg Oral Once   aspirin  81 mg Oral Daily   carvedilol  6.25 mg Oral BID WC   nitroGLYCERIN  0.4 mg Sublingual Once   rosuvastatin  20 mg Oral Daily   sacubitril-valsartan  1 tablet Oral BID   sodium chloride flush  3 mL Intravenous Q12H   ticagrelor  90 mg Oral BID   Continuous Infusions:  sodium chloride 10 mL/hr at 05/27/21 1732   sodium chloride     PRN Meds: sodium chloride, acetaminophen, diazepam, nitroGLYCERIN, ondansetron (ZOFRAN) IV, sodium chloride flush   Vital Signs    Vitals:   05/28/21 1747 05/28/21 2149 05/29/21 0541 05/29/21 0846  BP: 128/83 121/85 (!) 129/93 134/84  Pulse: 68 71 69 75  Resp: 18 17 17    Temp: 98 F (36.7 C) 98.1 F (36.7 C) 98.1 F (36.7 C)   TempSrc: Oral Oral Oral   SpO2: 99% 98% 98%   Weight:      Height:        Intake/Output Summary (Last 24 hours) at 05/29/2021 1210 Last data filed at 05/29/2021 0615 Gross per 24 hour  Intake 825.39 ml  Output --  Net 825.39 ml   Filed Weights   05/27/21 1632 05/28/21 0500  Weight: 102.1 kg 99.5 kg    Telemetry    SR - Personally Reviewed  ECG    No new - Personally Reviewed  Physical Exam   GEN: No acute distress.   Neck: No JVD Cardiac: RRR, no murmurs, rubs, or gallops.  Respiratory: Clear to auscultation bilaterally. GI: Soft, nontender, non-distended  MS: No edema; No deformity. Neuro:  Nonfocal  Psych: Normal affect   Labs    Chemistry Recent Labs  Lab 05/27/21 1630 05/27/21 1723 05/28/21 0314  NA 141 135 137  K 3.4* 2.8* 4.3  CL 107 101 107  CO2 20*  --  23  GLUCOSE 156* 179* 117*  BUN 19 18 15   CREATININE 1.02 0.60* 0.83  CALCIUM 10.1  --  8.3*  PROT 7.3  --   --   ALBUMIN 4.3  --   --   AST 35  --   --   ALT 43  --   --   ALKPHOS  80  --   --   BILITOT 1.0  --   --   GFRNONAA >60  --  >60  ANIONGAP 14  --  7     Hematology Recent Labs  Lab 05/27/21 1630 05/27/21 1723 05/28/21 0314  WBC 11.3*  --  10.1  RBC 5.17  --  4.31  HGB 15.5 14.6 13.0  HCT 46.1 43.0 39.3  MCV 89.2  --  91.2  MCH 30.0  --  30.2  MCHC 33.6  --  33.1  RDW 12.4  --  12.7  PLT 303  --  195    Cardiac EnzymesNo results for input(s): TROPONINI in the last 168 hours. No results for input(s): TROPIPOC in the last 168 hours.   BNPNo results for input(s): BNP, PROBNP in the last 168 hours.   DDimer No results for input(s): DDIMER in the last 168 hours.   Radiology    CARDIAC CATHETERIZATION  Result Date: 05/27/2021 Formatting of this result is different from  the original.   Lat 1st Diag lesion is 50% stenosed.   Mid LAD lesion is 50% stenosed.   1st Diag lesion is 60% stenosed.   Mid LAD to Dist LAD lesion is 40% stenosed.   RPAV lesion is 80% stenosed.   Ost LAD to Prox LAD lesion is 100% stenosed.   A drug-eluting stent was successfully placed.   Post intervention, there is a 0% residual stenosis. Acute ST segment elevation anterior wall myocardial infarction secondary to total occlusion of the LAD at its ostium with TIMI 0 flow and no evidence for collaterals. Mild narrowing of 25% in the moderate-sized bifurcating ramus intermediate vessel. Normal left circumflex coronary artery. Dominant RCA with focal 80% stenosis in the continuation branch just after the PDA takeoff. Difficult but successful PCI of the LAD ostium with ultimate insertion of a 3.5 x 15 mm Resolute Frontier DES stent postdilated to 3.80 mm with the100% occlusion being reduced to 0% and TIMI 0 flow improved to TIMI-3 flow.  Once the LAD was open, there is evidence for 60% stenosis at the ostium of the diagonal vessel and diffuse irregularity of 40% in the mid LAD. Elevated LVEDP at 34mmHg. RECOMMENDATION: DAPT for minimum of 1 year. Medical therapy for concomitant CAD with  possible need for intervention to the distal RCA.  A 2D echo Doppler study will be ordered for assessment of LV function with hopeful improvement and salvage of myocardium with time.  Recommend more aggressive lipid therapy and suggest changing pravastatin to rosuvastatin in this patient intolerant to previous atorvastatin.  Plan to initiate carvedilol and possible ARB therapy in a.m. as blood pressure and heart rate allows.   DG Chest Port 1 View  Result Date: 05/27/2021 CLINICAL DATA:  Chest pain EXAM: PORTABLE CHEST 1 VIEW COMPARISON:  04/19/2015 FINDINGS: Cardiomegaly. Both lungs are clear. The visualized skeletal structures are unremarkable. IMPRESSION: Cardiomegaly without acute abnormality of the lungs in AP portable projection. Electronically Signed   By: Lauralyn PrimesAlex  Bibbey M.D.   On: 05/27/2021 16:45   ECHOCARDIOGRAM LIMITED  Result Date: 05/28/2021    ECHOCARDIOGRAM LIMITED REPORT   Patient Name:   Clovis Pudward M Burel Jr. Date of Exam: 05/28/2021 Medical Rec #:  [id number]         Height:       68.0 in Accession #:    [id number]        Weight:       219.4 lb Date of Birth:  1966/05/08         BSA:          2.126 m Patient Age:    55 years          BP:           137/88 mmHg Patient Gender: M                 HR:           66 bpm. Exam Location:  Inpatient Procedure: Limited Echo, Color Doppler, Cardiac Doppler and Intracardiac            Opacification Agent Indications:    R07.9* Chest pain, unspecified  History:        Patient has prior history of Echocardiogram examinations, most                 recent 08/22/2017. CAD; Risk Factors:Hypertension, Dyslipidemia                 and COVID+ 05/27/21.  Sonographer:  [facility] Senior RDCS Referring Phys: [id number] Cyndi Bender  Sonographer Comments: COVID+ at time of study IMPRESSIONS  1. Left ventricular ejection fraction, by estimation, is 30%. The left ventricle has severely decreased function. The left ventricle demonstrates regional wall motion abnormalities (suggestive of  LAD disease).  2. Right ventricular systolic function is not well visualized but is grossly normal. The right ventricular size is normal.  3. The mitral valve is grossly normal. No evidence of mitral valve regurgitation.  4. The aortic valve was not well visualized. Aortic valve regurgitation is not visualized. No aortic stenosis is present.  5. The inferior vena cava is normal in size with <50% respiratory variability, suggesting right atrial pressure of 8 mmHg. Comparison(s): A prior study was performed on 08/22/2017. Decrease in LVEF with new wall motion abnormalities; technically difficult study. FINDINGS  Left Ventricle: Left ventricular ejection fraction, by estimation, is 30%. The left ventricle has severely decreased function. The left ventricle demonstrates regional wall motion abnormalities. Definity contrast agent was given IV to delineate the left  ventricular endocardial borders. The left ventricular internal cavity size was normal in size. There is no left ventricular hypertrophy.  LV Wall Scoring: The apex is dyskinetic. The mid and distal anterior wall, entire anterior septum, apical lateral segment, mid inferoseptal segment, and apical inferior segment are hypokinetic. Right Ventricle: The right ventricular size is normal. Right ventricular systolic function is normal. Left Atrium: Left atrial size was normal in size. Mitral Valve: The mitral valve is grossly normal. Tricuspid Valve: Tricuspid valve regurgitation is not demonstrated. Aortic Valve: The aortic valve was not well visualized. Aortic valve regurgitation is not visualized. No aortic stenosis is present. Pulmonic Valve: The pulmonic valve was not well visualized. Pulmonic valve regurgitation is not visualized. Venous: The inferior vena cava is normal in size with less than 50% respiratory variability, suggesting right atrial pressure of 8 mmHg. IAS/Shunts: The atrial septum is grossly normal. LEFT VENTRICLE PLAX 2D LVIDd:         4.70 cm  LVIDs:         3.50 cm LV PW:         1.00 cm LV IVS:        1.00 cm LVOT diam:     2.10 cm LV SV:         68 LV SV Index:   32 LVOT Area:     3.46 cm  RIGHT VENTRICLE RV S prime:     9.79 cm/s TAPSE (M-mode): 1.9 cm LEFT ATRIUM         Index LA diam:    3.10 cm 1.46 cm/m  AORTIC VALVE LVOT Vmax:   106.00 cm/s LVOT Vmean:  70.200 cm/s LVOT VTI:    0.195 m  SHUNTS Systemic VTI:  0.20 m Systemic Diam: 2.10 cm Riley Lam MD Electronically signed by Riley Lam MD Signature Date/Time: 05/28/2021/12:48:22 PM    Final     Cardiac Studies   Echo: 1. Left ventricular ejection fraction, by estimation, is 30%. The left  ventricle has severely decreased function. The left ventricle demonstrates  regional wall motion abnormalities (suggestive of LAD disease).   2. Right ventricular systolic function is not well visualized but is  grossly normal. The right ventricular size is normal.   3. The mitral valve is grossly normal. No evidence of mitral valve  regurgitation.   4. The aortic valve was not well visualized. Aortic valve regurgitation  is not visualized. No aortic stenosis is present.   5.  The inferior vena cava is normal in size with <50% respiratory  variability, suggesting right atrial pressure of 8 mmHg.    Cath 05/27/21: Lat 1st Diag lesion is 50% stenosed.   Mid LAD lesion is 50% stenosed.   1st Diag lesion is 60% stenosed.   Mid LAD to Dist LAD lesion is 40% stenosed.   RPAV lesion is 80% stenosed.   Ost LAD to Prox LAD lesion is 100% stenosed.   A drug-eluting stent was successfully placed.   Post intervention, there is a 0% residual stenosis.   Acute ST segment elevation anterior wall myocardial infarction secondary to total occlusion of the LAD at its ostium with TIMI 0 flow and no evidence for collaterals.   Mild narrowing of 25% in the moderate-sized bifurcating ramus intermediate vessel.   Normal left circumflex coronary artery.   Dominant RCA with focal 80%  stenosis in the continuation branch just after the PDA takeoff.   Difficult but successful PCI of the LAD ostium with ultimate insertion of a 3.5 x 15 mm Resolute Frontier DES stent postdilated to 3.80 mm with the100% occlusion being reduced to 0% and TIMI 0 flow improved to TIMI-3 flow.  Once the LAD was open, there is evidence for 60% stenosis at the ostium of the diagonal vessel and diffuse irregularity of 40% in the mid LAD.   Elevated LVEDP at .   RECOMMENDATION: DAPT for minimum of 1 year. Medical therapy for concomitant CAD with possible need for intervention to the distal RCA.  A 2D echo Doppler study will be ordered for assessment of LV function with hopeful improvement and salvage of myocardium with time.  Recommend more aggressive lipid therapy and suggest changing pravastatin to rosuvastatin in this patient intolerant to previous atorvastatin.  Plan to initiate carvedilol and possible ARB therapy in a.m. as blood pressure and heart rate allows.    Patient Profile     55 y.o. male  hypertension, hyperlipidemia, OSA on CPAP, here for STEMI with PCI to ostial LAD and residual dominant RCA distal 80 % stenosis.   Assessment & Plan   Active Problems:   ACS (acute coronary syndrome) (HCC)   ST elevation myocardial infarction involving left anterior descending (LAD) coronary artery (HCC)   STEMI involving left anterior descending coronary artery (HCC)   Anterior STEMI - Labs pending today - if normal, stable for dc home. - ASA 81 mg daily and ticagrelor 90 mg BID - crestor 20 mg daily - carvedilol 6.25 mg BID - LVEF low - recommend starting entresto 24-26 mg BID today. Will give first dose in hospital.   Needs close follow up in cardiology for med titration.   For questions or updates, please contact [facility] Please consult [email or web address] for contact info under        Signed, Parke Poisson, MD  05/29/2021, 12:10 PM

## 2021-05-29 NOTE — Discharge Summary (Signed)
Discharge Summary    Patient ID: Brandon Villanueva. MRN: [id number]; DOB: 23-Feb-1966  Admit date: 05/27/2021 Discharge date: 05/29/2021  PCP:  Johny Blamer, MD   [facility] Providers Cardiologist:  Armanda Magic, MD        Discharge Diagnoses    Principal Problem:   ST elevation myocardial infarction involving left anterior descending (LAD) coronary artery [facility]) Active Problems:   OSA (obstructive sleep apnea)   CAD in native artery   Essential hypertension   Hyperlipidemia with target LDL less than 70   Lab test positive for detection of COVID-19 virus   Ischemic cardiomyopathy    Diagnostic Studies/Procedures    Cardiac Cath 05/27/21     Lat 1st Diag lesion is 50% stenosed.   Mid LAD lesion is 50% stenosed.   1st Diag lesion is 60% stenosed.   Mid LAD to Dist LAD lesion is 40% stenosed.   RPAV lesion is 80% stenosed.   Ost LAD to Prox LAD lesion is 100% stenosed.   A drug-eluting stent was successfully placed.   Post intervention, there is a 0% residual stenosis.   Acute ST segment elevation anterior wall myocardial infarction secondary to total occlusion of the LAD at its ostium with TIMI 0 flow and no evidence for collaterals.   Mild narrowing of 25% in the moderate-sized bifurcating ramus intermediate vessel.   Normal left circumflex coronary artery.   Dominant RCA with focal 80% stenosis in the continuation branch just after the PDA takeoff.   Difficult but successful PCI of the LAD ostium with ultimate insertion of a 3.5 x 15 mm Resolute Frontier DES stent postdilated to 3.80 mm with the100% occlusion being reduced to 0% and TIMI 0 flow improved to TIMI-3 flow.  Once the LAD was open, there is evidence for 60% stenosis at the ostium of the diagonal vessel and diffuse irregularity of 40% in the mid LAD.   Elevated LVEDP at .   RECOMMENDATION: DAPT for minimum of 1 year. Medical therapy for concomitant CAD with possible need for intervention to the  distal RCA.  A 2D echo Doppler study will be ordered for assessment of LV function with hopeful improvement and salvage of myocardium with time.  Recommend more aggressive lipid therapy and suggest changing pravastatin to rosuvastatin in this patient intolerant to previous atorvastatin.  Plan to initiate carvedilol and possible ARB therapy in a.m. as blood pressure and heart rate allows.   2D Echo 05/28/21  1. Left ventricular ejection fraction, by estimation, is 30%. The left  ventricle has severely decreased function. The left ventricle demonstrates  regional wall motion abnormalities (suggestive of LAD disease).   2. Right ventricular systolic function is not well visualized but is  grossly normal. The right ventricular size is normal.   3. The mitral valve is grossly normal. No evidence of mitral valve  regurgitation.   4. The aortic valve was not well visualized. Aortic valve regurgitation  is not visualized. No aortic stenosis is present.   5. The inferior vena cava is normal in size with <50% respiratory  variability, suggesting right atrial pressure of 8 mmHg.   Comparison(s): A prior study was performed on 08/22/2017. Decrease in LVEF  with new wall motion abnormalities; technically difficult study.   _____________   History of Present Illness     Brandon Calixto. is a 55 y.o. male with HTN, HLD, OSA on CPAP, nonobstructive CAD by coronary CTA in 2013 who presented to [facility] as a  STEMI, also incidentally found to be positive for Covid.   He has a history of atypical chest pain with coronary CTA in 2013 showing atherosclerotic coronary artery disease involving the left anterior descending artery with no significant stenosis. In 2018, he underwent exercise Myoview which showed positive EKG with 2 mm horizontal ST segment depression in lateral leads and hypertension during exercise. EF was 49% with apical hypokinesis, however no ischemia was noted on nuclear imaging. He was managed  medically and has done well up until recent admission.  He was outside mowing the yard and suddenly developed acute onset of midsternal CP associated with SOB, nausea, diaphoresis. EMS was called and he was brought to the hospital. EKG showed normal sinus rhythm with ventricular rate of 92 bpm, ST elevation of V1-3, ST depression of inferior lateral leads. Upon arrival he was afebrile, hypertensive, normal O2 sat. He was treated with ASA, SL NTG, Zofran, morphine and heparin bolus He was taken to the cath lab emergently for further evaluation.  Hospital Course     1. CAD / acute anterior wall STEMI - troponin trend 33->[id number] - cardiac cath as outlined above with total occlusion of ostial LAD treated with PCI/DES, see report for residual disease treated medically with possible need for intervention to the distal RCA if clinical situation dictates - started on DAPT with recommendation of minimum of 1 yr - asked nurse to reach out to care mgr to get 30-day free Brilinta card prior to dc - started on BB, statin  2. Ischemic cardiomyopathy EF 30% by echo - 2D echo 05/28/21 EF 30%, severely reduced LV function with RWMA suggestive of LAD disease, grossly normal RV, technically difficult study - started on carvedilol, Entresto -> BP stable this afternoon - suggest f/u BMET at follow-up visit, titrate meds as able, consider spironolactone if BP will allow - Dr. Jacques Navy conferred with Dr. Lalla Brothers regarding Lifevest - given clinical stability, class IIb indication, and telemetry without significant burden of NSVT, no Lifevest planned at dc - did send msg to CHF [facility] team to arrange close follow-up for med titration visit (included FYI re Covid+ on 7/29 for timing) - they will also be able to assist with any cost concerns that arise  3. Essential HTN - follow in context of above  4. Hyperlipidemia LDL goal <70 - lipid profile showed total cholesterol of 174, HDL 48, LDL 109 - previously intolerant to  atorvastatin, started on rosuvastatin - if the patient is tolerating statin at time of follow-up appointment, would consider rechecking liver function/lipid panel in 6-8 weeks  5. Covid positive on PCR - discussed with MD - felt to be incidental positive, no acute symptoms - not a candidate for paxlovid due to Brilinta, no further antivirals considered given lack of symptoms, normal oxygenation, could also be occult holdover from prior infection in May 2022 - not felt specifically related #1 but symptoms definitely consistent with primary ACS on presentation - quarantine guidelines added to AVS  6. OSA  - continue CPAP  Dr. Jacques Navy has seen and examined the patient today and feels he is stable for discharge. In addition to CHF follow-up, I have sent a message to our office's scheduling team requesting a follow-up appointment, and our office will call the patient with this information. Reviewed 2g sodium restriction, 2L fluid restriction, daily weights with patient. CHF booklet provided. Discharge instructions are outlined below - patient given work note to remain out of work until cleared by cardiology team in follow-up.  Did the patient have an acute coronary syndrome (MI, NSTEMI, STEMI, etc) this admission?:  Yes                               AHA/ACC Clinical Performance & Quality Measures: Aspirin prescribed? - Yes ADP Receptor Inhibitor (Plavix/Clopidogrel, Brilinta/Ticagrelor or Effient/Prasugrel) prescribed (includes medically managed patients)? - Yes Beta Blocker prescribed? - Yes High Intensity Statin (Lipitor 40-80mg  or Crestor 20-40mg ) prescribed? - Yes EF assessed during THIS hospitalization? - Yes For EF <40%, was ACEI/ARB prescribed? - Yes For EF <40%, Aldosterone Antagonist (Spironolactone or Eplerenone) prescribed? - No - Reason:  just started Entresto today - want to follow BP response and re-evaluate as outpatient Cardiac Rehab Phase II ordered (including medically managed  patients)? - Yes      _____________  Discharge Vitals Blood pressure 134/84, pulse 75, temperature 98.1 F (36.7 C), temperature source Oral, resp. rate 17, height 5\' 8"  (1.727 m), weight 99.5 kg, SpO2 98 %.  Filed Weights   05/27/21 1632 05/28/21 0500  Weight: 102.1 kg 99.5 kg    Labs & Radiologic Studies    CBC Recent Labs    05/27/21 1630 05/27/21 1723 05/28/21 0314 05/29/21 1219  WBC 11.3*  --  10.1 8.4  NEUTROABS 6.2  --   --  5.4  HGB 15.5   < > 13.0 15.1  HCT 46.1   < > 39.3 45.6  MCV 89.2  --  91.2 90.5  PLT 303  --  195 242   < > = values in this interval not displayed.   Basic Metabolic Panel Recent Labs    16/07/9606/30/22 0314 05/29/21 1219  NA 137 135  K 4.3 4.0  CL 107 103  CO2 23 25  GLUCOSE 117* 119*  BUN 15 12  CREATININE 0.83 0.86  CALCIUM 8.3* 9.1   Liver Function Tests Recent Labs    05/27/21 1630  AST 35  ALT 43  ALKPHOS 80  BILITOT 1.0  PROT 7.3  ALBUMIN 4.3   No results for input(s): LIPASE, AMYLASE in the last 72 hours. High Sensitivity Troponin:   Recent Labs  Lab 05/27/21 1630 05/27/21 1909  TROPONINIHS 33* 4,290*    BNP Invalid input(s): POCBNP D-Dimer No results for input(s): DDIMER in the last 72 hours. Hemoglobin A1C Recent Labs    05/27/21 1630  HGBA1C 5.7*   Fasting Lipid Panel Recent Labs    05/27/21 1630  CHOL 174  HDL 48  LDLCALC 109*  TRIG 87  CHOLHDL 3.6   _____________  MR CERVICAL SPINE WO CONTRAST  Result Date: 05/26/2021 CLINICAL DATA:  Cervical radiculopathy M54.12 (ICD-10-CM). Additional history provided by scanning technologist: Patient reports posterior pain shooting to right shoulder intermittently for 2 years, swelling in hands. EXAM: MRI CERVICAL SPINE WITHOUT CONTRAST TECHNIQUE: Multiplanar, multisequence MR imaging of the cervical spine was performed. No intravenous contrast was administered. COMPARISON:  Radiographs of the cervical spine 02/18/2021. MRI of the cervical spine 10/12/2019.  FINDINGS: Alignment: Trace C3-C4 grade 1 retrolisthesis. Vertebrae: Vertebral body height is maintained. No significant marrow edema or focal suspicious osseous lesion. Cord: No spinal cord signal abnormality is identified. Posterior Fossa, vertebral arteries, paraspinal tissues: No abnormality identified within included portions of the posterior fossa. Flow voids preserved within the imaged cervical vertebral arteries. Paraspinal soft tissues within normal limits. Disc levels: Unless otherwise stated, the level by level findings below have not significantly changed from the prior MRI  of 10/12/2019. No more than mild disc degeneration at any level. C2-C3: No significant disc herniation or stenosis. C3-C4: Trace grade 1 anterolisthesis. Disc bulge. Bilateral uncovertebral hypertrophy. Facet arthrosis. No significant spinal canal stenosis. Mild right neural foraminal narrowing, new from the prior exam. C4-C5: No significant disc herniation or spinal canal stenosis. Mild-to-moderate facet arthrosis. Mild relative right neural foraminal narrowing. C5-C6: Shallow broad-based central disc protrusion. Uncovertebral hypertrophy on the right. Facet arthrosis with mild ligamentum flavum hypertrophy. Mild partial effacement of the ventral thecal sac without spinal cord mass effect. Moderate right neural foraminal narrowing. C6-C7: Shallow disc bulge. Minimal facet arthrosis. No significant spinal canal or foraminal stenosis. C7-T1: No significant disc herniation or stenosis. IMPRESSION: Comparison is made to the prior cervical spine MRI of 10/12/2019. Mild right C3-C4 neural foraminal narrowing is new from the prior exam. Cervical spondylosis, as outlined and otherwise unchanged. No more than mild relative spinal canal narrowing (without spinal cord mass effect). Moderate neural foraminal narrowing on the right at C5-C6. Mild relative neural foraminal narrowing on the right at C4-C5. Electronically Signed   By: Jackey Loge DO    On: 05/26/2021 12:09   CARDIAC CATHETERIZATION  Result Date: 05/27/2021 Formatting of this result is different from the original.   Lat 1st Diag lesion is 50% stenosed.   Mid LAD lesion is 50% stenosed.   1st Diag lesion is 60% stenosed.   Mid LAD to Dist LAD lesion is 40% stenosed.   RPAV lesion is 80% stenosed.   Ost LAD to Prox LAD lesion is 100% stenosed.   A drug-eluting stent was successfully placed.   Post intervention, there is a 0% residual stenosis. Acute ST segment elevation anterior wall myocardial infarction secondary to total occlusion of the LAD at its ostium with TIMI 0 flow and no evidence for collaterals. Mild narrowing of 25% in the moderate-sized bifurcating ramus intermediate vessel. Normal left circumflex coronary artery. Dominant RCA with focal 80% stenosis in the continuation branch just after the PDA takeoff. Difficult but successful PCI of the LAD ostium with ultimate insertion of a 3.5 x 15 mm Resolute Frontier DES stent postdilated to 3.80 mm with the100% occlusion being reduced to 0% and TIMI 0 flow improved to TIMI-3 flow.  Once the LAD was open, there is evidence for 60% stenosis at the ostium of the diagonal vessel and diffuse irregularity of 40% in the mid LAD. Elevated LVEDP at . RECOMMENDATION: DAPT for minimum of 1 year. Medical therapy for concomitant CAD with possible need for intervention to the distal RCA.  A 2D echo Doppler study will be ordered for assessment of LV function with hopeful improvement and salvage of myocardium with time.  Recommend more aggressive lipid therapy and suggest changing pravastatin to rosuvastatin in this patient intolerant to previous atorvastatin.  Plan to initiate carvedilol and possible ARB therapy in a.m. as blood pressure and heart rate allows.   DG Chest Port 1 View  Result Date: 05/27/2021 CLINICAL DATA:  Chest pain EXAM: PORTABLE CHEST 1 VIEW COMPARISON:  04/19/2015 FINDINGS: Cardiomegaly. Both lungs are clear. The  visualized skeletal structures are unremarkable. IMPRESSION: Cardiomegaly without acute abnormality of the lungs in AP portable projection. Electronically Signed   By: Lauralyn Primes M.D.   On: 05/27/2021 16:45   ECHOCARDIOGRAM LIMITED  Result Date: 05/28/2021    ECHOCARDIOGRAM LIMITED REPORT   Patient Name:   Brandon Villanueva. Date of Exam: 05/28/2021 Medical Rec #:  [id number]  Height:       68.0 in Accession #:    [id number]        Weight:       219.4 lb Date of Birth:  03/08/1966         BSA:          2.126 m Patient Age:    55 years          BP:           137/88 mmHg Patient Gender: M                 HR:           66 bpm. Exam Location:  Inpatient Procedure: Limited Echo, Color Doppler, Cardiac Doppler and Intracardiac            Opacification Agent Indications:    R07.9* Chest pain, unspecified  History:        Patient has prior history of Echocardiogram examinations, most                 recent 08/22/2017. CAD; Risk Factors:Hypertension, Dyslipidemia                 and COVID+ 05/27/21.  Sonographer:    Irving Burton Senior RDCS Referring Phys: [id number] Cyndi Bender  Sonographer Comments: COVID+ at time of study IMPRESSIONS  1. Left ventricular ejection fraction, by estimation, is 30%. The left ventricle has severely decreased function. The left ventricle demonstrates regional wall motion abnormalities (suggestive of LAD disease).  2. Right ventricular systolic function is not well visualized but is grossly normal. The right ventricular size is normal.  3. The mitral valve is grossly normal. No evidence of mitral valve regurgitation.  4. The aortic valve was not well visualized. Aortic valve regurgitation is not visualized. No aortic stenosis is present.  5. The inferior vena cava is normal in size with <50% respiratory variability, suggesting right atrial pressure of 8 mmHg. Comparison(s): A prior study was performed on 08/22/2017. Decrease in LVEF with new wall motion abnormalities; technically difficult study.  FINDINGS  Left Ventricle: Left ventricular ejection fraction, by estimation, is 30%. The left ventricle has severely decreased function. The left ventricle demonstrates regional wall motion abnormalities. Definity contrast agent was given IV to delineate the left  ventricular endocardial borders. The left ventricular internal cavity size was normal in size. There is no left ventricular hypertrophy.  LV Wall Scoring: The apex is dyskinetic. The mid and distal anterior wall, entire anterior septum, apical lateral segment, mid inferoseptal segment, and apical inferior segment are hypokinetic. Right Ventricle: The right ventricular size is normal. Right ventricular systolic function is normal. Left Atrium: Left atrial size was normal in size. Mitral Valve: The mitral valve is grossly normal. Tricuspid Valve: Tricuspid valve regurgitation is not demonstrated. Aortic Valve: The aortic valve was not well visualized. Aortic valve regurgitation is not visualized. No aortic stenosis is present. Pulmonic Valve: The pulmonic valve was not well visualized. Pulmonic valve regurgitation is not visualized. Venous: The inferior vena cava is normal in size with less than 50% respiratory variability, suggesting right atrial pressure of 8 mmHg. IAS/Shunts: The atrial septum is grossly normal. LEFT VENTRICLE PLAX 2D LVIDd:         4.70 cm LVIDs:         3.50 cm LV PW:         1.00 cm LV IVS:        1.00 cm LVOT diam:     2.10  cm LV SV:         68 LV SV Index:   32 LVOT Area:     3.46 cm  RIGHT VENTRICLE RV S prime:     9.79 cm/s TAPSE (M-mode): 1.9 cm LEFT ATRIUM         Index LA diam:    3.10 cm 1.46 cm/m  AORTIC VALVE LVOT Vmax:   106.00 cm/s LVOT Vmean:  70.200 cm/s LVOT VTI:    0.195 m  SHUNTS Systemic VTI:  0.20 m Systemic Diam: 2.10 cm Riley Lam MD Electronically signed by Riley Lam MD Signature Date/Time: 05/28/2021/12:48:22 PM    Final    Disposition   Pt is being discharged home today in good  condition.  Follow-up Plans & Appointments     Follow-up Information     Quintella Reichert, MD Follow up.   Specialty: Cardiology Why: [facility] - Our office will call you for a follow-up appointment. Please call the office if you have not heard from [place] within 3 days. The CHF transition of care clinic will also call you for a post-hospital visit. Contact information: [address] [place] [address] [phone]                Discharge Instructions     Amb Referral to Cardiac Rehabilitation   Complete by: As directed    Diagnosis:  Coronary Stents STEMI PTCA     After initial evaluation and assessments completed: Virtual Based Care may be provided alone or in conjunction with Phase 2 Cardiac Rehab based on patient barriers.: Yes   Diet - low sodium heart healthy   Complete by: As directed    Discharge instructions   Complete by: As directed    Meloxicam/Mobic falls into a category of medications that can increase risk of stomach bleeding while on blood thinners. Patients taking blood thinners should generally stay away from medicines like ibuprofen, Advil, Motrin, naproxen, and Aleve as well. You may take Tylenol as directed or talk to your primary doctor about alternatives.  See last page for Covid instructions.  One of your heart tests showed weakness of the heart muscle this admission (EF is 30% by echocardiogram). This may make you more susceptible to weight gain from fluid retention, which can lead to symptoms that we call heart failure. Please follow these special instructions:  For patients with congestive heart failure, we give them these special instructions:  1. Follow a low-salt diet - you should be eating no more than 2,000mg  of sodium per day. This does not necessarily just apply to the salt you put on top of prepared food, but the sodium already in food. Processed food, frozen meals, canned goods, deli meat, and bread can have a surprising  amount of sodium per serving so be sure to track this daily. 2. Watch your fluid intake. In general, you should not be taking in more than 2 liters of fluid per day (close to 64 oz of fluid per day). This includes sources of water in foods like soup, coffee, tea, milk, etc. It's important to stay hydrated but NOT to excess. 2. Weigh yourself on the same scale at same time of day and keep a log. 3. Call your doctor: (Anytime you feel any of the following symptoms)  - 3lb weight gain overnight or 5lb within a few days - Shortness of breath, with or without a dry hacking cough  - Swelling in the hands, feet or stomach  -  If you have to sleep on extra pillows at night in order to breathe   IT IS IMPORTANT TO LET YOUR DOCTOR KNOW EARLY ON IF YOU ARE HAVING SYMPTOMS SO WE CAN HELP YOU!   Increase activity slowly   Complete by: As directed    No driving for 2 weeks. No lifting over 10 lbs for 4 weeks. No sexual activity for 4 weeks. You may not return to work until cleared by your cardiologist. Keep procedure site clean & dry. If you notice increased pain, swelling, bleeding or pus, call/return!  You may shower, but no soaking baths/hot tubs/pools for 1 week.       Discharge Medications   Allergies as of 05/29/2021       Reactions   Lipitor [atorvastatin] Other (See Comments)   Muscle aches        Medication List     STOP taking these medications    meloxicam 15 MG tablet Commonly known as: MOBIC   perindopril 4 MG tablet Commonly known as: ACEON   pravastatin 80 MG tablet Commonly known as: PRAVACHOL       TAKE these medications    aspirin 81 MG tablet Take 81 mg by mouth daily.   carvedilol 6.25 MG tablet Commonly known as: COREG Take 1 tablet (6.25 mg total) by mouth 2 (two) times daily with a meal.   nitroGLYCERIN 0.4 MG SL tablet Commonly known as: NITROSTAT Place 1 tablet (0.4 mg total) under the tongue every 5 (five) minutes as needed for chest pain (up to 3  doses. If taking 3rd dose call 911).   rosuvastatin 20 MG tablet Commonly known as: CRESTOR Take 1 tablet (20 mg total) by mouth daily. Start taking on: May 30, 2021   sacubitril-valsartan 24-26 MG Commonly known as: ENTRESTO Take 1 tablet by mouth 2 (two) times daily.   ticagrelor 90 MG Tabs tablet Commonly known as: BRILINTA Take 1 tablet (90 mg total) by mouth 2 (two) times daily.           Outstanding Labs/Studies   If the patient is tolerating statin at time of follow-up appointment, would consider rechecking liver function/lipid panel in 6-8 weeks.  Duration of Discharge Encounter   Greater than 30 minutes including physician time.  Signed, Laurann Montana, PA-C 05/29/2021, 2:09 PM

## 2021-05-29 NOTE — Discharge Instructions (Addendum)
Covid-19 Quarantine Instructions per [employer]

## 2021-05-30 ENCOUNTER — Telehealth: Payer: Self-pay | Admitting: Cardiovascular Disease

## 2021-05-30 ENCOUNTER — Telehealth (HOSPITAL_COMMUNITY): Payer: Self-pay

## 2021-05-30 ENCOUNTER — Encounter (HOSPITAL_COMMUNITY): Payer: Self-pay | Admitting: Cardiovascular Disease

## 2021-05-30 ENCOUNTER — Telehealth (HOSPITAL_COMMUNITY): Payer: Self-pay | Admitting: Surgery

## 2021-05-30 ENCOUNTER — Telehealth: Payer: Self-pay | Admitting: Student in an Organized Health Care Education/Training Program

## 2021-05-30 ENCOUNTER — Telehealth: Payer: Self-pay | Admitting: Cardiology

## 2021-05-30 NOTE — Telephone Encounter (Signed)
Pt wife called and wanted to get pt scheduled for cardiac rehab, I explained how the process works that the pt would have to have a f/u appointment with his cardiologist and be cleared before he can start the cardiac rehab program. Pt wife understood. Will f/u with pt once he has f/u with cardiologist.

## 2021-05-30 NOTE — Telephone Encounter (Signed)
Messaged received by Ronie Spies requesting HF TOC clinic appt. Contacted patient, explained purpose of visit and he and his wife are agreeable to come. Scheduled appt for 8/3 @ 11. Directions and garage parking code provided.

## 2021-05-30 NOTE — Telephone Encounter (Signed)
Spoke to patient's wife.She stated someone already called her.Husband has a post hospital appointment with Edd Fabian NP 8/9 at 11:15 am.

## 2021-05-30 NOTE — Telephone Encounter (Signed)
TOC scheduled for 08/09 with Edd Fabian at 11:15am per Ronie Spies

## 2021-05-30 NOTE — Telephone Encounter (Signed)
Wife of the patient called. The patient was told upon hospital discharge to get an appointment with Dr. Tresa Endo this week. The wife was not happy when the first thing the office had was not until 07/06/21 with Gillian Shields.   The wife wanted to know what to do. Please advise

## 2021-05-30 NOTE — Telephone Encounter (Signed)
Paged by operator regarding hypotension. Spoke to wife, patient 80/50s when standing, 100/70s supine. Lightheadedness when standing that she feels is related to new medications. Low dose entresto is only possible contributor, new start before discharge the same day s/p STEMI. Ideally he would be on this given reduced EF however may not tolerate right now. Told her to keep the prescription but for now can d/c until f/u in clinic. Explained the rest of the medications are never miss medications, especially the ASA+ticag. Return precautions/911 if sBP < 70 or ongoing sx 08/01 after medication washout.

## 2021-05-30 NOTE — Telephone Encounter (Signed)
I called to remind patient about scheduled upcoming HF Heart Impact Appt for tomorrow.  He tells me that he plans to attend appt.

## 2021-05-31 NOTE — Telephone Encounter (Signed)
Patient contacted regarding discharge from [facility] on Sunday July 31.  Patient understands to follow up with Edd Fabian, NP on 8/9 Patient understands discharge instructions?  Yes Patient understands medication changes and current medication schedule?  Yes. He states his BP dropped with his second dose of Entresto the evening of his discharge. He was advised by the on call MD to hold this until his follow up visit. He states his BP is normal this morning  Patient understands to bring all medications to this visit?  Yes  Patient states no additional questions.

## 2021-06-01 ENCOUNTER — Encounter (HOSPITAL_COMMUNITY): Payer: BC Managed Care – PPO

## 2021-06-01 MED FILL — Lidocaine HCl Local Preservative Free (PF) Inj 1%: INTRAMUSCULAR | Qty: 30 | Status: AC

## 2021-06-03 ENCOUNTER — Encounter (HOSPITAL_COMMUNITY): Payer: Self-pay

## 2021-06-03 ENCOUNTER — Ambulatory Visit (HOSPITAL_COMMUNITY)
Admission: RE | Admit: 2021-06-03 | Discharge: 2021-06-03 | Disposition: A | Discharge status: Home / Self Care | Payer: BC Managed Care – PPO | Source: Ambulatory Visit | Attending: Internal Medicine | Admitting: Internal Medicine

## 2021-06-03 ENCOUNTER — Other Ambulatory Visit: Payer: Self-pay

## 2021-06-03 VITALS — BP 138/84 | HR 62 | Wt 217.8 lb

## 2021-06-03 DIAGNOSIS — Z955 Presence of coronary angioplasty implant and graft: Secondary | ICD-10-CM | POA: Diagnosis not present

## 2021-06-03 DIAGNOSIS — E785 Hyperlipidemia, unspecified: Secondary | ICD-10-CM | POA: Insufficient documentation

## 2021-06-03 DIAGNOSIS — G4733 Obstructive sleep apnea (adult) (pediatric): Secondary | ICD-10-CM | POA: Diagnosis not present

## 2021-06-03 DIAGNOSIS — Z7982 Long term (current) use of aspirin: Secondary | ICD-10-CM | POA: Insufficient documentation

## 2021-06-03 DIAGNOSIS — I2510 Atherosclerotic heart disease of native coronary artery without angina pectoris: Secondary | ICD-10-CM | POA: Insufficient documentation

## 2021-06-03 DIAGNOSIS — Z79899 Other long term (current) drug therapy: Secondary | ICD-10-CM | POA: Insufficient documentation

## 2021-06-03 DIAGNOSIS — I213 ST elevation (STEMI) myocardial infarction of unspecified site: Secondary | ICD-10-CM | POA: Insufficient documentation

## 2021-06-03 DIAGNOSIS — I110 Hypertensive heart disease with heart failure: Secondary | ICD-10-CM | POA: Insufficient documentation

## 2021-06-03 DIAGNOSIS — Z9989 Dependence on other enabling machines and devices: Secondary | ICD-10-CM | POA: Diagnosis not present

## 2021-06-03 DIAGNOSIS — I5022 Chronic systolic (congestive) heart failure: Secondary | ICD-10-CM | POA: Diagnosis not present

## 2021-06-03 DIAGNOSIS — I255 Ischemic cardiomyopathy: Secondary | ICD-10-CM

## 2021-06-03 LAB — CBC
HCT: 41.7 % (ref 39.0–52.0)
Hemoglobin: 14.6 g/dL (ref 13.0–17.0)
MCH: 30.7 pg (ref 26.0–34.0)
MCHC: 35 g/dL (ref 30.0–36.0)
MCV: 87.8 fL (ref 80.0–100.0)
Platelets: 245 10*3/uL (ref 150–400)
RBC: 4.75 MIL/uL (ref 4.22–5.81)
RDW: 12.2 % (ref 11.5–15.5)
WBC: 6.4 10*3/uL (ref 4.0–10.5)
nRBC: 0 % (ref 0.0–0.2)

## 2021-06-03 LAB — BASIC METABOLIC PANEL
Anion gap: 7 (ref 5–15)
BUN: 14 mg/dL (ref 6–20)
CO2: 26 mmol/L (ref 22–32)
Calcium: 9.3 mg/dL (ref 8.9–10.3)
Chloride: 102 mmol/L (ref 98–111)
Creatinine, Ser: 0.82 mg/dL (ref 0.61–1.24)
GFR, Estimated: >60 mL/min (ref >60)
Glucose, Bld: 103 mg/dL — ABNORMAL HIGH (ref 70–99)
Potassium: 3.9 mmol/L (ref 3.5–5.1)
Sodium: 135 mmol/L (ref 135–145)

## 2021-06-03 LAB — BRAIN NATRIURETIC PEPTIDE: B Natriuretic Peptide: 30.4 pg/mL (ref 0.0–100.0)

## 2021-06-03 MED ORDER — LOSARTAN POTASSIUM 25 MG PO TABS: 12.5000 mg | ORAL_TABLET | Freq: Every day | ORAL | 3 refills | Status: DC

## 2021-06-03 NOTE — Progress Notes (Signed)
Heart and Vascular Care Navigation  06/03/2021  Brandon Villanueva. 07/04/1966 [id number]  Reason for Referral: Patient seen in HF TOC.   Engaged with patient face to face for initial visit for Heart and Vascular Care Coordination.                                                                                                   Assessment:   Patient is a 55 yo male who lives at home with his wife. Patient was working full time until last Friday when he reports he had a "massive heart attack". Patient reports he can not drive for 2 weeks and can not lift anything over 10 lbs for 4 weeks. Patient reports he current job requires daily lifting of over 10 Lbs throughout the day. Patient does not anticipate returning to work for 4 weeks.   Patient reports he has a short term disability benefit through his employer and CSW suggested exploring the options and needed documents. Patient admits that he has some monies to sustain him for a short term but will need some income in the coming weeks.                                 HRT/VAS Care Coordination     Patients Home Cardiology Office --  [facility]   Outpatient Care Team Social Worker   Social Worker Name: Lasandra Beech, [place] [phone]   Living arrangements for the past 2 months Single Family Home   Lives with: Spouse   Patient Current Insurance Coverage Commercial Insurance   Patient Has Concern With Paying Medical Bills No   Does Patient Have Prescription Coverage? Yes   Home Assistive Devices/Equipment None       Social History:                                                                             SDOH Screenings   Alcohol Screen: Not on file  Depression (PHQ2-9): Not on file  Financial Resource Strain: Low Risk    Difficulty of Paying Living Expenses: Not very hard  Food Insecurity: No Food Insecurity   Worried About Running Out of Food in the Last Year: Never true   Ran Out of Food in the Last Year: Never true  Housing:  Low Risk    Last Housing Risk Score: 0  Physical Activity: Not on file  Social Connections: Not on file  Stress: Not on file  Tobacco Use: Low Risk    Smoking Tobacco Use: Never   Smokeless Tobacco Use: Never  Transportation Needs: No Transportation Needs   Lack of Transportation (Medical): No   Lack of Transportation (Non-Medical): No    SDOH Interventions: Financial Resources:  Surveyor, quantity  Strain Interventions: Other (Comment) (Discussed Short term disability options through employer) Short term disability  Food Insecurity:  Food Insecurity Interventions: Intervention Not Indicated  Housing Insecurity:  Housing Interventions: Intervention Not Indicated  Transportation:   Transportation Interventions: Intervention Not Indicated   Follow-up plan:  CSW discussed typical process for short term disability and suggested patient contact his employer for options. Patient will follow up and return call if needed. CSW provided contact information if needed. Lasandra Beech, LCSW, CCSW-MCS [phone]

## 2021-06-03 NOTE — Progress Notes (Signed)
[facility] Trans[facility]  PCP: Johny Blamer Primary Cardiologist: Armanda Magic  HPI:  Brandon Villanueva. is a 55 y.o.  male  with a PMH significant for HTN, HLD, OSA on CPAP, nonobstructive CAD by coronary CTA in 2013, Recent STEMI and subsequent severe systolic dysfunction.   Atypical chest pain with coronary CTA in 2013 showing atherosclerotic coronary artery disease involving the left anterior descending artery with no significant stenosis.08/13/2017, he underwent exercise Myoview which showed positive EKG with 2 mm horizontal ST segment depression in lateral leads and hypertension during exercise. EF was 49% with apical hypokinesis, however no reversible ischemia was noted on nuclear imaging. He was managed medically.  TTE 08/16/2017 was completely normal EF 60-65%, no diastolic dysfunction.     05/27/21 developed acute onset of midsternal CP associated with SOB, nausea, diaphoresis while working outside. EMS was called and he was brought to the hospital. EKG with ST elevation of V1-3, and reciprocal ST depression in inferior leads.  Taken emergently for LHC:      TTE with EF 30% RWMA suggestive of LAD disease, normal RV.  GDMT titrated, on carvedilol 6.25 BID.  Started on Entresto 24/26 day of discharge but had severe symptomatic hypotension and this was discontinued.    Feeling well since leaving the hospital.  Walking about 9 minutes 3x daily.  No chest pain or dyspnea on exertion.  Denies orthopnea, PND.  BP at home 124/80, 129/76, 115/72.  Weighing himself daily weight is going down due to exercising and dieting to lose weight.  Down to 213lbs by his scale from 219 on discharge.    ROS: All systems negative except as listed in HPI, PMH and Problem List.  SH:  Social History   Socioeconomic History   Marital status: Married    Spouse name: Not on file   Number of children: Not on file   Years of education: Not on file   Highest education level:  Not on file  Occupational History   Not on file  Tobacco Use   Smoking status: Never   Smokeless tobacco: Never  Vaping Use   Vaping Use: Never used  Substance and Sexual Activity   Alcohol use: No   Drug use: No   Sexual activity: Not on file  Other Topics Concern   Not on file  Social History Narrative   Not on file   Social Determinants of Health   Financial Resource Strain: Not on file  Food Insecurity: Not on file  Transportation Needs: Not on file  Physical Activity: Not on file  Stress: Not on file  Social Connections: Not on file  Intimate Partner Violence: Not on file    FH:  Family History  Problem Relation Age of Onset   Hyperlipidemia Father    Coronary artery disease Father 43   Diabetes Paternal Grandfather    Heart attack Paternal Grandmother    Coronary artery disease Paternal Grandmother     Past Medical History:  Diagnosis Date   Arthritis    CAD in native artery    a. STEMI 04/2021 s/p DES to LAD.   Essential hypertension    Hyperlipidemia with target LDL less than 70 02/02/2016   Hypertension    Morbid obesity (HCC) 07/07/2015   OSA (obstructive sleep apnea) 09/28/2015   Severe with AHI 47/hr with successful CPAP titration to 11cm H2O   Recurrent right inguinal hernia s/p lap repair with mesh 06/09/14 06/01/2014    Current  Outpatient Medications  Medication Sig Dispense Refill   aspirin 81 MG tablet Take 81 mg by mouth daily.     carvedilol (COREG) 6.25 MG tablet Take 1 tablet (6.25 mg total) by mouth 2 (two) times daily with a meal. 60 tablet 6   nitroGLYCERIN (NITROSTAT) 0.4 MG SL tablet Place 1 tablet (0.4 mg total) under the tongue every 5 (five) minutes as needed for chest pain (up to 3 doses. If taking 3rd dose call 911). 25 tablet 3   rosuvastatin (CRESTOR) 20 MG tablet Take 1 tablet (20 mg total) by mouth daily. 30 tablet 6   ticagrelor (BRILINTA) 90 MG TABS tablet Take 1 tablet (90 mg total) by mouth 2 (two) times daily. 60 tablet  11   No current facility-administered medications for this encounter.    Vitals:   06/03/21 1049  BP: 138/84  Pulse: 62  SpO2: 92%  Weight: 98.8 kg (217 lb 12.8 oz)    PHYSICAL EXAM: Cardiac: JVD flat, normal rate and rhythm, clear s1 and s2, no murmurs, rubs or gallops, no LE edema Pulmonary: CTAB, not in distress Abdominal: non distended abdomen, soft and nontender Psych: Alert, conversant, in good spirits  ASSESSMENT & PLAN: Ischemic Cardiomyopathy Systolic CHF -04/2021 LHC emergently done for STEMI with CAD outlined above, rec'd PCI to LAD -04/2021 TTE with EF 30% RWMA suggestive of LAD disease, normal RV -NYHA Class II, euvolemic on exam -continue carvedilol 6.25 BID -severe symptomatic hypotension with entresto and based on home bp will start losartan 12.5 QHS.  He will keep a log of bp for follow up visit  -labs today -will need continued GDMT titration.  I think he will have good recovery of EF and not require ICD      CAD: -continue DAPT, statin, and gdmt above  OSA: -Continue cpap   Follow up with [facility]

## 2021-06-03 NOTE — Patient Instructions (Addendum)
START Losartan 12.5 mg, one half tab daily at bedtime  Labs today We will only contact you if something comes back abnormal or we need to make some changes. Otherwise no news is good news!  Keep cardiology follow up as scheduled     At the [facility], you and your health needs are our priority. As part of our continuing mission to provide you with exceptional heart care, we have created designated Provider Care Teams. These Care Teams include your primary Cardiologist (physician) and Advanced Practice Providers (APPs- Physician Assistants and Nurse Practitioners) who all work together to provide you with the care you need, when you need it.   You may see any of the following providers on your designated Care Team at your next follow up: Dr Arvilla Meres Dr Marca Ancona Dr Brandon Melnick, NP Robbie Lis, [place] Mikki Santee Karle Plumber, PharmD   Please be sure to bring in all your medications bottles to every appointment.

## 2021-06-05 NOTE — Progress Notes (Signed)
Cardiology Office Note:    Date:  06/07/2021   ID:  Brandon Pu., DOB 21-Jun-1966, MRN [id number]  PCP:  Johny Blamer, MD   [facility] Providers Cardiologist:  Nicki Guadalajara, MD      Referring MD: Johny Blamer, MD   Follow-up for ischemic cardiomyopathy and coronary artery disease.  History of Present Illness:    Brandon Kestenbaum. is a 55 y.o. male with a hx of hypertension, obstructive sleep apnea on CPAP, hyperlipidemia, nonobstructive coronary artery disease, ischemic cardiomyopathy, and NSTEMI.  He underwent coronary CTA 2013 which showed atherosclerotic CAD in his LAD with no significant stenosis.  A nuclear stress test 10/18 showed positive EKG with 2 mm horizontal ST segment depression in his lateral leads and a hypertensive response to exercise.  His EF at that time was 49% with apical hypokinesis.  No reversible ischemia was noted.  Medical management was recommended.  A echocardiogram 10/18 showed normal LVEF 60-65% with no diastolic dysfunction.  He presented to the emergency department with cute onset of midsternal chest pain and shortness of breath 05/27/2021.  He also reported nausea and diaphoresis while he had been working outside.  EMS was contacted and he was transported to [facility].  His EKG showed ST elevation in V1-V3 and reciprocal ST depression in the inferior leads.  He underwent cardiac catheterization which showed 25% lesion ramus, 80% lesion RCA 100% LAD, and 60% at the ostium of the diagonal, and 40% mid LAD.  He received PCI with DES x1 to his proximal LAD.  His echocardiogram 05/28/2021 showed an LVEF of 30% and no significant valvular abnormalities.  He was started on carvedilol, Entresto 24/26.  However, he developed severe hypotension and the medication was discontinued.  He was seen in follow-up by the heart and vascular transition care team 8/5.  He reported he felt well at that time.  He reported walking 3 times per day for about 9  minutes.  He denied chest pain dyspnea.  He denied orthopnea and PND.  His blood pressure at home was in the 120s-1 teens over 80s-70s.  His weight continues to decrease.  His weight at home was around 213 pounds and his discharge weight was 219 pounds.  His carvedilol was continued and he was initiated on losartan 12.5 mg daily.  He presents the clinic today for follow-up evaluation states he is increasing his physical activity at home.  He is now up to 13 minutes 3 times per day of walking.  He does note occasional increased work of breathing while at rest.  He does not notice any increased dyspnea with increased physical activity.  We reviewed his angiography results and his stent placement.  We reviewed his hyperlipidemia and importance of heart healthy high-fiber diet.  He has a very physically active body shop job.  Given his new heart failure I have asked him to continue to increase his physical activity over the next 2 weeks.  At that time we will repeat echocardiogram and I would like him to start back at work doing half-time for 1 week then return to work full-time.  I have not given him a work note at this time.  His daughter works at [employer].  I will give him the heart healthy diet sheet, have him continue to increase his physical activity, and follow-up in 3 months.  We will also repeat his fasting lipids and LFTs in 4 weeks.  Today he denies chest pain, shortness  of breath, lower extremity edema, fatigue, palpitations, melena, hematuria, hemoptysis, diaphoresis, weakness, presyncope, syncope, orthopnea, and PND.   Past Medical History:  Diagnosis Date   Arthritis    CAD in native artery    a. STEMI 04/2021 s/p DES to LAD.   Essential hypertension    Hyperlipidemia with target LDL less than 70 02/02/2016   Hypertension    Morbid obesity (HCC) 07/07/2015   OSA (obstructive sleep apnea) 09/28/2015   Severe with AHI 47/hr with successful CPAP titration to 11cm H2O   Recurrent  right inguinal hernia s/p lap repair with mesh 06/09/14 06/01/2014    Past Surgical History:  Procedure Laterality Date   CORONARY STENT INTERVENTION N/A 05/27/2021   Procedure: CORONARY STENT INTERVENTION;  Surgeon: Lennette Bihari, MD;  Location: [facility] INVASIVE CV LAB;  Service: Cardiovascular;  Laterality: N/A;   CORONARY/GRAFT ACUTE MI REVASCULARIZATION N/A 05/27/2021   Procedure: Coronary/Graft Acute MI Revascularization;  Surgeon: Lennette Bihari, MD;  Location: [facility] INVASIVE CV LAB;  Service: Cardiovascular;  Laterality: N/A;   INGUINAL HERNIA REPAIR Right 2010   LEFT HEART CATH AND CORONARY ANGIOGRAPHY N/A 05/27/2021   Procedure: LEFT HEART CATH AND CORONARY ANGIOGRAPHY;  Surgeon: Lennette Bihari, MD;  Location: [facility] INVASIVE CV LAB;  Service: Cardiovascular;  Laterality: N/A;   VENTRAL HERNIA REPAIR     Repaired twice by Dr. Luretha Murphy    Current Medications: Current Meds  Medication Sig   aspirin 81 MG tablet Take 81 mg by mouth daily.   carvedilol (COREG) 6.25 MG tablet Take 1 tablet (6.25 mg total) by mouth 2 (two) times daily with a meal.   losartan (COZAAR) 25 MG tablet Take 0.5 tablets (12.5 mg total) by mouth at bedtime.   nitroGLYCERIN (NITROSTAT) 0.4 MG SL tablet Place 1 tablet (0.4 mg total) under the tongue every 5 (five) minutes as needed for chest pain (up to 3 doses. If taking 3rd dose call 911).   rosuvastatin (CRESTOR) 20 MG tablet Take 1 tablet (20 mg total) by mouth daily.   ticagrelor (BRILINTA) 90 MG TABS tablet Take 1 tablet (90 mg total) by mouth 2 (two) times daily.     Allergies:   Lipitor [atorvastatin]   Social History   Socioeconomic History   Marital status: Married    Spouse name: Not on file   Number of children: Not on file   Years of education: Not on file   Highest education level: Not on file  Occupational History   Not on file  Tobacco Use   Smoking status: Never   Smokeless tobacco: Never  Vaping Use   Vaping Use: Never used  Substance  and Sexual Activity   Alcohol use: No   Drug use: No   Sexual activity: Not on file  Other Topics Concern   Not on file  Social History Narrative   Not on file   Social Determinants of Health   Financial Resource Strain: Low Risk    Difficulty of Paying Living Expenses: Not very hard  Food Insecurity: No Food Insecurity   Worried About Running Out of Food in the Last Year: Never true   Ran Out of Food in the Last Year: Never true  Transportation Needs: No Transportation Needs   Lack of Transportation (Medical): No   Lack of Transportation (Non-Medical): No  Physical Activity: Not on file  Stress: Not on file  Social Connections: Not on file     Family History: The patient's family history includes Coronary  artery disease in his paternal grandmother; Coronary artery disease (age of onset: 25) in his father; Diabetes in his paternal grandfather; Heart attack in his paternal grandmother; Hyperlipidemia in his father.  ROS:   Please see the history of present illness.      All other systems reviewed and are negative.   Risk Assessment/Calculations:           Physical Exam:    VS:  BP 102/64   Pulse (!) 58   Ht 5\' 8"  (1.727 m)   Wt 219 lb (99.3 kg)   SpO2 96%   BMI 33.30 kg/m     Wt Readings from Last 3 Encounters:  06/07/21 219 lb (99.3 kg)  06/03/21 217 lb 12.8 oz (98.8 kg)  05/28/21 219 lb 5.7 oz (99.5 kg)     GEN:  Well nourished, well developed in no acute distress HEENT: Normal NECK: No JVD; No carotid bruits LYMPHATICS: No lymphadenopathy CARDIAC: RRR, no murmurs, rubs, gallops RESPIRATORY:  Clear to auscultation without rales, wheezing or rhonchi  ABDOMEN: Soft, non-tender, non-distended MUSCULOSKELETAL:  No edema; No deformity  SKIN: Warm and dry NEUROLOGIC:  Alert and oriented x 3 PSYCHIATRIC:  Normal affect    EKGs/Labs/Other Studies Reviewed:    The following studies were reviewed today: Echocardiogram 05/28/2021 IMPRESSIONS     1. Left  ventricular ejection fraction, by estimation, is 30%. The left  ventricle has severely decreased function. The left ventricle demonstrates  regional wall motion abnormalities (suggestive of LAD disease).   2. Right ventricular systolic function is not well visualized but is  grossly normal. The right ventricular size is normal.   3. The mitral valve is grossly normal. No evidence of mitral valve  regurgitation.   4. The aortic valve was not well visualized. Aortic valve regurgitation  is not visualized. No aortic stenosis is present.   5. The inferior vena cava is normal in size with <50% respiratory  variability, suggesting right atrial pressure of 8 mmHg.   Comparison(s): A prior study was performed on 08/22/2017. Decrease in LVEF  with new wall motion abnormalities; technically difficult study.   FINDINGS   Left Ventricle: Left ventricular ejection fraction, by estimation, is  30%. The left ventricle has severely decreased function. The left  ventricle demonstrates regional wall motion abnormalities. Definity  contrast agent was given IV to delineate the left   ventricular endocardial borders. The left ventricular internal cavity  size was normal in size. There is no left ventricular hypertrophy.      LV Wall Scoring:  The apex is dyskinetic. The mid and distal anterior wall, entire anterior  septum, apical lateral segment, mid inferoseptal segment, and apical  inferior  segment are hypokinetic.   Right Ventricle: The right ventricular size is normal. Right ventricular  systolic function is normal.   Left Atrium: Left atrial size was normal in size.   Mitral Valve: The mitral valve is grossly normal.   Tricuspid Valve: Tricuspid valve regurgitation is not demonstrated.   Aortic Valve: The aortic valve was not well visualized. Aortic valve  regurgitation is not visualized. No aortic stenosis is present.   Pulmonic Valve: The pulmonic valve was not well visualized. Pulmonic  valve  regurgitation is not visualized.   Venous: The inferior vena cava is normal in size with less than 50%  respiratory variability, suggesting right atrial pressure of 8 mmHg.   IAS/Shunts: The atrial septum is grossly normal.   Cardiac catheterization 05/28/2019   Lat 1st Diag lesion  is 50% stenosed.   Mid LAD lesion is 50% stenosed.   1st Diag lesion is 60% stenosed.   Mid LAD to Dist LAD lesion is 40% stenosed.   RPAV lesion is 80% stenosed.   Ost LAD to Prox LAD lesion is 100% stenosed.   A drug-eluting stent was successfully placed.   Post intervention, there is a 0% residual stenosis.   Acute ST segment elevation anterior wall myocardial infarction secondary to total occlusion of the LAD at its ostium with TIMI 0 flow and no evidence for collaterals.   Mild narrowing of 25% in the moderate-sized bifurcating ramus intermediate vessel.   Normal left circumflex coronary artery.   Dominant RCA with focal 80% stenosis in the continuation branch just after the PDA takeoff.   Difficult but successful PCI of the LAD ostium with ultimate insertion of a 3.5 x 15 mm Resolute Frontier DES stent postdilated to 3.80 mm with the100% occlusion being reduced to 0% and TIMI 0 flow improved to TIMI-3 flow.  Once the LAD was open, there is evidence for 60% stenosis at the ostium of the diagonal vessel and diffuse irregularity of 40% in the mid LAD.   Elevated LVEDP at .   RECOMMENDATION: DAPT for minimum of 1 year. Medical therapy for concomitant CAD with possible need for intervention to the distal RCA.  A 2D echo Doppler study will be ordered for assessment of LV function with hopeful improvement and salvage of myocardium with time.  Recommend more aggressive lipid therapy and suggest changing pravastatin to rosuvastatin in this patient intolerant to previous atorvastatin.  Plan to initiate carvedilol and possible ARB therapy in a.m. as blood pressure and heart rate  allows.  Diagnostic Dominance: Right    Intervention     EKG:  EKG is  ordered today.  The ekg ordered today demonstrates sinus bradycardia T wave abnormality 58 bpm  Recent Labs: 05/27/2021: ALT 43 06/03/2021: B Natriuretic Peptide 30.4; BUN 14; Creatinine, Ser 0.82; Hemoglobin 14.6; Platelets 245; Potassium 3.9; Sodium 135  Recent Lipid Panel    Component Value Date/Time   CHOL 174 05/27/2021 1630   CHOL 139 07/24/2018 0723   TRIG 87 05/27/2021 1630   HDL 48 05/27/2021 1630   HDL 49 07/24/2018 0723   CHOLHDL 3.6 05/27/2021 1630   VLDL 17 05/27/2021 1630   LDLCALC 109 (H) 05/27/2021 1630   LDLCALC 80 07/24/2018 0723    ASSESSMENT & PLAN    Ischemic cardiomyopathy-no increased DOE or activity intolerance.  Tolerating losartan well.  Home blood pressures 1 teens over 70s.  We will plan to continue to uptitrate losartan as long as BP allows. Continue carvedilol, losartan Heart healthy low-sodium diet-salty 6 given Increase physical activity as tolerated Recommend repeat echocardiogram after medications have been optimized for 1 month. Maintain blood pressure log  Coronary artery disease-no further episodes of arm neck, back or chest discomfort.  Continues to slowly increase physical activity. Continue aspirin, Brilinta, rosuvastatin, carvedilol, losartan, nitroglycerin Heart healthy low-sodium diet-salty 6 given Increase physical activity as tolerated  Obstructive sleep apnea-reports compliance with CPAP device.  Waking up well rested. Continue CPAP use  Disposition: Follow-up with Dr. Tresa Endo or me in 3 months        Medication Adjustments/Labs and Tests Ordered: Current medicines are reviewed at length with the patient today.  Concerns regarding medicines are outlined above.  Orders Placed This Encounter  Procedures   Lipid panel   Hepatic function panel   AMB referral to cardiac  rehabilitation   EKG 12-Lead   ECHOCARDIOGRAM COMPLETE   No orders of the  defined types were placed in this encounter.   Patient Instructions  Medication Instructions:  Your physician recommends that you continue on your current medications as directed. Please refer to the Current Medication list given to you today.  *If you need a refill on your cardiac medications before your next appointment, please call your pharmacy*   Lab Work: Your physician recommends that you return for a FASTING lipid profile and liver function panel in 4 weeks--around September 9th.  Testing/Procedures: Your physician has requested that you have an echocardiogram in 2 weeks. Echocardiography is a painless test that uses sound waves to create images of your heart. It provides your doctor with information about the size and shape of your heart and how well your heart's chambers and valves are working. This procedure takes approximately one hour. There are no restrictions for this procedure.  Edd Fabian, FNP will determine whether ok to return to work based on the results of the echocardiogram. Our office will call you with these results and next steps.   Follow-Up: At [facility], you and your health needs are our priority.  As part of our continuing mission to provide you with exceptional heart care, we have created designated Provider Care Teams.  These Care Teams include your primary Cardiologist (physician) and Advanced Practice Providers (APPs -  Physician Assistants and Nurse Practitioners) who all work together to provide you with the care you need, when you need it.  We recommend signing up for the patient portal called "MyChart".  Sign up information is provided on this After Visit Summary.  MyChart is used to connect with patients for Virtual Visits (Telemedicine).  Patients are able to view lab/test results, encounter notes, upcoming appointments, etc.  Non-urgent messages can be sent to your provider as well.   To learn more about what you can do with MyChart, go to  [email or web address].    Your next appointment:   3-4 month(s)  The format for your next appointment:   In Person  Provider:   You may see Nicki Guadalajara, MD or one of the following Advanced Practice Providers on your designated Care Team:   Azalee Course, PA-C Micah Flesher, [place]   Judy Pimple, [place]   Other Instructions If you need a note for work, please call the [place] office. The note can be picked up there.  Please increase your physical activity as tolerated.  Tips to Measure your Blood Pressure Correctly  To determine whether you have hypertension, a medical professional will take a blood pressure reading. How you prepare for the test, the position of your arm, and other factors can change a blood pressure reading by 10% or more. That could be enough to hide high blood pressure, start you on a drug you don't really need, or lead your doctor to incorrectly adjust your medications.  National and international guidelines offer specific instructions for measuring blood pressure. If a doctor, nurse, or medical assistant isn't doing it right, don't hesitate to ask him or her to get with the guidelines.  Here's what you can do to ensure a correct reading:  Don't drink a caffeinated beverage or smoke during the 30 minutes before the test.  Sit quietly for five minutes before the test begins.  During the measurement, sit in a chair with your feet on the floor and your arm supported so your elbow is at about heart level.  The  inflatable part of the cuff should completely cover at least 80% of your upper arm, and the cuff should be placed on bare skin, not over a shirt.  Don't talk during the measurement.  Have your blood pressure measured twice, with a brief break in between. If the readings are different by 5 points or more, have it done a third time.  In 2017, new guidelines from the American Heart Association, the [employer] College of [employer]iology, and nine other health organizations  lowered the diagnosis of high blood pressure to 130/80 mm Hg or higher for all adults. The guidelines also redefined the various blood pressure categories to now include normal, elevated, Stage 1 hypertension, Stage 2 hypertension, and hypertensive crisis (see "Blood pressure categories").  Blood pressure categories  Blood pressure category SYSTOLIC (upper number)  DIASTOLIC (lower number)  Normal Less than 120 mm Hg and Less than 80 mm Hg  Elevated 120-129 mm Hg and Less than 80 mm Hg  High blood pressure: Stage 1 hypertension 130-139 mm Hg or 80-89 mm Hg  High blood pressure: Stage 2 hypertension 140 mm Hg or higher or 90 mm Hg or higher  Hypertensive crisis (consult your doctor immediately) Higher than 180 mm Hg and/or Higher than 120 mm Hg  Source: American Heart Association and American Stroke Association. For more on getting your blood pressure under control, buy Controlling Your Blood Pressure, a Special Health Report from [employer].   Blood Pressure Log   Date   Time  Blood Pressure  Position  Example: Nov 1 9 AM 124/78 sitting                                                        Signed, Ronney AstersJesse M Omya Winfield, NP  06/07/2021 12:57 PM      Notice: This dictation was prepared with Dragon dictation along with smaller phrase technology. Any transcriptional errors that result from this process are unintentional and may not be corrected upon review.  I spent 15 minutes examining this patient, reviewing medications, and using patient centered shared decision making involving her cardiac care.  Prior to her visit I spent greater than 20 minutes reviewing her past medical history,  medications, and prior cardiac tests.

## 2021-06-06 NOTE — Telephone Encounter (Signed)
Pt insurance is active and benefits verified through [employer]. Co-pay $0.00, DED $500.00/$500.00 met, out of pocket $2,000.00/$1,858.30 met, co-insurance 20%. No pre-authorization required. Passport, 06/06/21 @ 12:02PM, REF#[id number]   Will contact patient to see if he is interested in the Cardiac Rehab Program. If interested, patient will need to complete follow up appt. Once completed, patient will be contacted for scheduling upon review by the RN Navigator.  

## 2021-06-07 ENCOUNTER — Telehealth: Payer: Self-pay | Admitting: Cardiovascular Disease

## 2021-06-07 ENCOUNTER — Encounter (HOSPITAL_BASED_OUTPATIENT_CLINIC_OR_DEPARTMENT_OTHER): Payer: Self-pay | Admitting: General Practice

## 2021-06-07 ENCOUNTER — Ambulatory Visit (HOSPITAL_BASED_OUTPATIENT_CLINIC_OR_DEPARTMENT_OTHER): Payer: BC Managed Care – PPO | Admitting: General Practice

## 2021-06-07 ENCOUNTER — Other Ambulatory Visit: Payer: Self-pay

## 2021-06-07 VITALS — BP 102/64 | HR 58 | Ht 68.0 in | Wt 219.0 lb

## 2021-06-07 DIAGNOSIS — I2510 Atherosclerotic heart disease of native coronary artery without angina pectoris: Secondary | ICD-10-CM

## 2021-06-07 DIAGNOSIS — E785 Hyperlipidemia, unspecified: Secondary | ICD-10-CM | POA: Diagnosis not present

## 2021-06-07 DIAGNOSIS — G4733 Obstructive sleep apnea (adult) (pediatric): Secondary | ICD-10-CM | POA: Diagnosis not present

## 2021-06-07 DIAGNOSIS — I255 Ischemic cardiomyopathy: Secondary | ICD-10-CM

## 2021-06-07 NOTE — Patient Instructions (Signed)
Medication Instructions:  Your physician recommends that you continue on your current medications as directed. Please refer to the Current Medication list given to you today.  *If you need a refill on your cardiac medications before your next appointment, please call your pharmacy*   Lab Work: Your physician recommends that you return for a FASTING lipid profile and liver function panel in 4 weeks--around September 9th.  Testing/Procedures: Your physician has requested that you have an echocardiogram in 2 weeks. Echocardiography is a painless test that uses sound waves to create images of your heart. It provides your doctor with information about the size and shape of your heart and how well your heart's chambers and valves are working. This procedure takes approximately one hour. There are no restrictions for this procedure.  Edd Fabian, FNP will determine whether ok to return to work based on the results of the echocardiogram. Our office will call you with these results and next steps.   Follow-Up: At [facility], you and your health needs are our priority.  As part of our continuing mission to provide you with exceptional heart care, we have created designated Provider Care Teams.  These Care Teams include your primary Cardiologist (physician) and Advanced Practice Providers (APPs -  Physician Assistants and Nurse Practitioners) who all work together to provide you with the care you need, when you need it.  We recommend signing up for the patient portal called "MyChart".  Sign up information is provided on this After Visit Summary.  MyChart is used to connect with patients for Virtual Visits (Telemedicine).  Patients are able to view lab/test results, encounter notes, upcoming appointments, etc.  Non-urgent messages can be sent to your provider as well.   To learn more about what you can do with MyChart, go to [email or web address].    Your next appointment:   3-4 month(s)  The  format for your next appointment:   In Person  Provider:   You may see Nicki Guadalajara, MD or one of the following Advanced Practice Providers on your designated Care Team:   Azalee Course, PA-C Micah Flesher, [place]   Judy Pimple, [place]   Other Instructions If you need a note for work, please call the [facility] office. The note can be picked up there.  Please increase your physical activity as tolerated.  Tips to Measure your Blood Pressure Correctly  To determine whether you have hypertension, a medical professional will take a blood pressure reading. How you prepare for the test, the position of your arm, and other factors can change a blood pressure reading by 10% or more. That could be enough to hide high blood pressure, start you on a drug you don't really need, or lead your doctor to incorrectly adjust your medications.  National and international guidelines offer specific instructions for measuring blood pressure. If a doctor, nurse, or medical assistant isn't doing it right, don't hesitate to ask him or her to get with the guidelines.  Here's what you can do to ensure a correct reading:  Don't drink a caffeinated beverage or smoke during the 30 minutes before the test.  Sit quietly for five minutes before the test begins.  During the measurement, sit in a chair with your feet on the floor and your arm supported so your elbow is at about heart level.  The inflatable part of the cuff should completely cover at least 80% of your upper arm, and the cuff should be placed on bare skin, not over a shirt.  Don't talk during the measurement.  Have your blood pressure measured twice, with a brief break in between. If the readings are different by 5 points or more, have it done a third time.  In 2017, new guidelines from the American Heart Association, the [employer] of Cardiology, and nine other health organizations lowered the diagnosis of high blood pressure to 130/80 mm Hg or higher for  all adults. The guidelines also redefined the various blood pressure categories to now include normal, elevated, Stage 1 hypertension, Stage 2 hypertension, and hypertensive crisis (see "Blood pressure categories").  Blood pressure categories  Blood pressure category SYSTOLIC (upper number)  DIASTOLIC (lower number)  Normal Less than 120 mm Hg and Less than 80 mm Hg  Elevated 120-129 mm Hg and Less than 80 mm Hg  High blood pressure: Stage 1 hypertension 130-139 mm Hg or 80-89 mm Hg  High blood pressure: Stage 2 hypertension 140 mm Hg or higher or 90 mm Hg or higher  Hypertensive crisis (consult your doctor immediately) Higher than 180 mm Hg and/or Higher than 120 mm Hg  Source: American Heart Association and American Stroke Association. For more on getting your blood pressure under control, buy Controlling Your Blood Pressure, a Special Health Report from [facility].   Blood Pressure Log   Date   Time  Blood Pressure  Position  Example: Nov 1 9 AM 124/78 sitting

## 2021-06-07 NOTE — Telephone Encounter (Signed)
Patient states he is returning a call to a Ladona Ridgel, but he is unsure what it was regarding. I also don't see any updates/notes. Can someone please advise on this?

## 2021-06-08 NOTE — Telephone Encounter (Signed)
Spoke to pt and informed him that we sent in the referral for Cardiac Rehab yesterday. Informed him he should receive a call in a week or two.   Looked at the referrals in his chart. Two of the referrals placed had been cancelled, and the third was authorized---this one was placed by Dr. Tresa Endo on 05/27/21. Left message with Cardiac Rehab asking why other referrals were cancelled, and why pt had not been scheduled. Pt had these questions at appt yesterday as well. He stated he had to be seen in the office before they could schedule him.  Waiting for call back from Cardiac Rehab.

## 2021-06-09 ENCOUNTER — Telehealth (HOSPITAL_COMMUNITY): Payer: Self-pay | Admitting: Family Medicine

## 2021-06-09 DIAGNOSIS — L719 Rosacea, unspecified: Secondary | ICD-10-CM | POA: Diagnosis not present

## 2021-06-09 NOTE — Telephone Encounter (Signed)
Returned [employer] phone call from pt cardiologist office and advised her that we have received pt cardiac rehab referral and he has been cleared and explained of our 1-3 month backlog to her. She understood and stated that she would advised pt. The other referrals that were received for cardiac rehab were duplicates and were canceled. Notes are placed in referral to verify.

## 2021-06-10 NOTE — Telephone Encounter (Signed)
Pt wife called and stated that pt wanted to schedule for cardiac rehab, advised pt's wife that we have a backlog right now and that he has a few pt in front him before we can schedule and we will give him a call at a later date pt and pt's wife understood.

## 2021-06-14 DIAGNOSIS — Z0279 Encounter for issue of other medical certificate: Secondary | ICD-10-CM

## 2021-06-15 ENCOUNTER — Telehealth: Payer: Self-pay | Admitting: Cardiology

## 2021-06-15 NOTE — Telephone Encounter (Signed)
Forms from Ailene Ards, Montez Hageman received on 06/15/2021. Completed patient auth attached. Took form to Dr. Mayford Knife provider's box for completion.

## 2021-06-24 ENCOUNTER — Other Ambulatory Visit: Payer: Self-pay

## 2021-06-24 ENCOUNTER — Ambulatory Visit (HOSPITAL_COMMUNITY): Payer: BC Managed Care – PPO | Attending: Internal Medicine

## 2021-06-24 DIAGNOSIS — I2510 Atherosclerotic heart disease of native coronary artery without angina pectoris: Secondary | ICD-10-CM | POA: Diagnosis not present

## 2021-06-24 DIAGNOSIS — G4733 Obstructive sleep apnea (adult) (pediatric): Secondary | ICD-10-CM | POA: Diagnosis not present

## 2021-06-24 DIAGNOSIS — I255 Ischemic cardiomyopathy: Secondary | ICD-10-CM | POA: Insufficient documentation

## 2021-06-24 LAB — ECHOCARDIOGRAM COMPLETE
Area-P 1/2: 2.74 cm2
S' Lateral: 3.1 cm

## 2021-06-24 MED ORDER — PERFLUTREN LIPID MICROSPHERE
1.0000 mL | INTRAVENOUS | Status: AC | PRN
  Administered 2021-06-24: 2 mL via INTRAVENOUS

## 2021-06-27 ENCOUNTER — Telehealth: Payer: Self-pay | Admitting: Cardiovascular Disease

## 2021-06-27 NOTE — Telephone Encounter (Signed)
Ronney Asters, NP  06/26/2021  2:38 PM EDT     Please contact Mr. Mcdonald and let him know that his echocardiogram has been reviewed.  His pumping function is normal.  No valvular abnormalities were noted.  No further testing is needed at this time.  Great result.  Thank you.    Patient wants to know if he can get a note to go back to work. Patient stated that provider said he could possible work part time for 2 weeks and then go back full time after that. Patient is requesting a note for work. Patient stated is we could send note through Mychart, that he can print it out. Will send to Edd Fabian NP for note.

## 2021-06-27 NOTE — Telephone Encounter (Signed)
New message:     Patient calling to check the status of his test and would like to know if can go back to work and if so he needs a note to go back.

## 2021-06-28 NOTE — Telephone Encounter (Signed)
Letter provided. See mychart message

## 2021-07-05 ENCOUNTER — Encounter (HOSPITAL_BASED_OUTPATIENT_CLINIC_OR_DEPARTMENT_OTHER): Payer: Self-pay

## 2021-07-07 NOTE — Telephone Encounter (Signed)
Received completed form from Dr. Mayford Knife, faxed form to [facility] [employer] 07/07/21  [employer]

## 2021-07-08 DIAGNOSIS — E785 Hyperlipidemia, unspecified: Secondary | ICD-10-CM | POA: Diagnosis not present

## 2021-07-08 DIAGNOSIS — I2510 Atherosclerotic heart disease of native coronary artery without angina pectoris: Secondary | ICD-10-CM | POA: Diagnosis not present

## 2021-07-08 DIAGNOSIS — I255 Ischemic cardiomyopathy: Secondary | ICD-10-CM | POA: Diagnosis not present

## 2021-07-09 LAB — LIPID PANEL
Chol/HDL Ratio: 2.3 ratio (ref 0.0–5.0)
Cholesterol, Total: 102 mg/dL (ref 100–199)
HDL: 44 mg/dL (ref >39)
LDL Chol Calc (NIH): 44 mg/dL (ref 0–99)
Triglycerides: 62 mg/dL (ref 0–149)
VLDL Cholesterol Cal: 14 mg/dL (ref 5–40)

## 2021-07-09 LAB — HEPATIC FUNCTION PANEL
ALT: 26 IU/L (ref 0–44)
AST: 25 IU/L (ref 0–40)
Albumin: 4.6 g/dL (ref 3.8–4.9)
Alkaline Phosphatase: 93 IU/L (ref 44–121)
Bilirubin Total: 0.9 mg/dL (ref 0.0–1.2)
Bilirubin, Direct: 0.29 mg/dL (ref 0.00–0.40)
Total Protein: 6.9 g/dL (ref 6.0–8.5)

## 2021-07-14 ENCOUNTER — Telehealth (HOSPITAL_COMMUNITY): Payer: Self-pay

## 2021-07-14 NOTE — Telephone Encounter (Signed)
Called patient to see if he was interested in participating in the Cardiac Rehab Program. Patient stated yes. Patient will come in for orientation on 08/23/21 @ 2:15PM and will attend the 3:15PM exercise class.   Pensions consultant.

## 2021-08-23 ENCOUNTER — Encounter: Payer: Self-pay | Admitting: Podiatry

## 2021-08-23 ENCOUNTER — Ambulatory Visit (INDEPENDENT_AMBULATORY_CARE_PROVIDER_SITE_OTHER): Payer: BC Managed Care – PPO | Admitting: Podiatry

## 2021-08-23 ENCOUNTER — Telehealth (HOSPITAL_COMMUNITY): Payer: Self-pay | Admitting: *Deleted

## 2021-08-23 ENCOUNTER — Other Ambulatory Visit: Payer: Self-pay

## 2021-08-23 ENCOUNTER — Encounter (HOSPITAL_COMMUNITY): Payer: Self-pay

## 2021-08-23 ENCOUNTER — Encounter (HOSPITAL_COMMUNITY)
Admission: RE | Admit: 2021-08-23 | Discharge: 2021-08-23 | Disposition: A | Discharge status: Home / Self Care | Payer: BC Managed Care – PPO | Source: Ambulatory Visit | Attending: Cardiovascular Disease | Admitting: Cardiovascular Disease

## 2021-08-23 VITALS — BP 124/70 | HR 79 | Ht 67.5 in | Wt 216.5 lb

## 2021-08-23 DIAGNOSIS — M7742 Metatarsalgia, left foot: Secondary | ICD-10-CM | POA: Diagnosis not present

## 2021-08-23 DIAGNOSIS — M7741 Metatarsalgia, right foot: Secondary | ICD-10-CM | POA: Diagnosis not present

## 2021-08-23 DIAGNOSIS — M2141 Flat foot [pes planus] (acquired), right foot: Secondary | ICD-10-CM | POA: Diagnosis not present

## 2021-08-23 DIAGNOSIS — M76829 Posterior tibial tendinitis, unspecified leg: Secondary | ICD-10-CM | POA: Diagnosis not present

## 2021-08-23 DIAGNOSIS — M2142 Flat foot [pes planus] (acquired), left foot: Secondary | ICD-10-CM

## 2021-08-23 DIAGNOSIS — Z955 Presence of coronary angioplasty implant and graft: Secondary | ICD-10-CM | POA: Insufficient documentation

## 2021-08-23 DIAGNOSIS — I213 ST elevation (STEMI) myocardial infarction of unspecified site: Secondary | ICD-10-CM | POA: Insufficient documentation

## 2021-08-23 NOTE — Progress Notes (Signed)
Cardiac Individual Treatment Plan  Patient Details  Name: Brandon Villanueva. MRN: [id number] Date of Birth: 26-May-1966 Referring Provider:   Flowsheet Row CARDIAC REHAB PHASE II ORIENTATION from 08/23/2021 in [facility]  Referring Provider Troy Sine, MD       Initial Encounter Date:  [place] PHASE II ORIENTATION from 08/23/2021 in [facility]  Date 08/23/21       Visit Diagnosis: 05/27/21 STEMI  05/27/21 DES LAD  Patient's Home Medications on Admission:  Current Outpatient Medications:    aspirin EC 81 MG tablet, Take 81 mg by mouth in the morning. Swallow whole., Disp: , Rfl:    carvedilol (COREG) 6.25 MG tablet, Take 1 tablet (6.25 mg total) by mouth 2 (two) times daily with a meal., Disp: 60 tablet, Rfl: 6   Glucosamine-Chondroitin (COSAMIN DS PO), Take 2 tablets by mouth in the morning. MoveWell, Disp: , Rfl:    losartan (COZAAR) 25 MG tablet, Take 0.5 tablets (12.5 mg total) by mouth at bedtime., Disp: 45 tablet, Rfl: 3   magnesium oxide (MAG-OX) 400 MG tablet, Take 400 mg by mouth every evening., Disp: , Rfl:    metroNIDAZOLE (METROGEL) 1 % gel, Apply 1 application topically daily as needed (rosacea flares)., Disp: , Rfl:    nitroGLYCERIN (NITROSTAT) 0.4 MG SL tablet, Place 1 tablet (0.4 mg total) under the tongue every 5 (five) minutes as needed for chest pain (up to 3 doses. If taking 3rd dose call 911)., Disp: 25 tablet, Rfl: 3   OVER THE COUNTER MEDICATION, Take 1 capsule by mouth in the morning and at bedtime. doTERRA Deep Blue Polyphenol Complex (frankincense, turmeric, green tea, ginger, pomegranate, and grape seed), Disp: , Rfl:    rosuvastatin (CRESTOR) 20 MG tablet, Take 1 tablet (20 mg total) by mouth daily., Disp: 30 tablet, Rfl: 6   ticagrelor (BRILINTA) 90 MG TABS tablet, Take 1 tablet (90 mg total) by mouth 2 (two) times daily., Disp: 60 tablet, Rfl: 11  Past Medical  History: Past Medical History:  Diagnosis Date   Arthritis    CAD in native artery    a. STEMI 04/2021 s/p DES to LAD.   Essential hypertension    Hyperlipidemia with target LDL less than 70 02/02/2016   Hypertension    Morbid obesity ([place]) 07/07/2015   OSA (obstructive sleep apnea) 09/28/2015   Severe with AHI 47/hr with successful CPAP titration to 11cm H2O   Recurrent right inguinal hernia s/p lap repair with mesh 06/09/14 06/01/2014    Tobacco Use: Social History   Tobacco Use  Smoking Status Never  Smokeless Tobacco Never    Labs: Recent Review Flowsheet Data     Labs for ITP Cardiac and Pulmonary Rehab Latest Ref Rng & Units 03/01/2017 06/05/2018 07/24/2018 05/27/2021 07/08/2021   Cholestrol 100 - 199 mg/dL 140 142 139 174 102   LDLCALC 0 - 99 mg/dL 76 83 80 109(H) 44   HDL >39 mg/dL 56 46 49 48 44   Trlycerides 0 - 149 mg/dL 42 63 51 87 62   Hemoglobin A1c 4.8 - 5.6 % - - - 5.7(H) -   TCO2 22 - 32 mmol/L - - - 19(L) -       Capillary Blood Glucose: No results found for: GLUCAP   Exercise Target Goals: Exercise Program Goal: Individual exercise prescription set using results from initial 6 min walk test and THRR while considering  patient's activity barriers  and safety.   Exercise Prescription Goal: Starting with aerobic activity 30 plus minutes a day, 3 days per week for initial exercise prescription. Provide home exercise prescription and guidelines that participant acknowledges understanding prior to discharge.  Activity Barriers & Risk Stratification:  Activity Barriers & Cardiac Risk Stratification - 08/23/21 1325       Activity Barriers & Cardiac Risk Stratification   Activity Barriers Arthritis;Other (comment);Balance Concerns    Comments Bilateral fallen arches, bilateral knee suregery: meniscus trimmed.    Cardiac Risk Stratification High             6 Minute Walk:  6 Minute Walk     Row Name 08/23/21 1347         6 Minute Walk   Phase  Initial     Distance 1722 feet     Walk Time 6 minutes     # of Rest Breaks 0     MPH 3.26     METS 4.34     RPE 12     Perceived Dyspnea  0     VO2 Peak 15.18     Symptoms No     Resting HR 79 bpm     Resting BP 124/70     Resting Oxygen Saturation  96 %     Exercise Oxygen Saturation  during 6 min walk 97 %     Max Ex. HR 103 bpm     Max Ex. BP 156/78     2 Minute Post BP 128/78              Oxygen Initial Assessment:   Oxygen Re-Evaluation:   Oxygen Discharge (Final Oxygen Re-Evaluation):   Initial Exercise Prescription:  Initial Exercise Prescription - 08/23/21 1500       Date of Initial Exercise RX and Referring Provider   Date 08/23/21    Referring Provider Lennette Bihari, MD    Expected Discharge Date 10/21/21      Bike   Level 2    Minutes 15    METs 3.2      NuStep   Level 3    SPM 85    Minutes 15    METs 3.2      Prescription Details   Frequency (times per week) 3    Duration Progress to 30 minutes of continuous aerobic without signs/symptoms of physical distress      Intensity   THRR 40-80% of Max Heartrate 66-132    Ratings of Perceived Exertion 11-13    Perceived Dyspnea 0-4      Progression   Progression Continue to progress workloads to maintain intensity without signs/symptoms of physical distress.      Resistance Training   Training Prescription Yes    Weight 4 lbs    Reps 10-15             Perform Capillary Blood Glucose checks as needed.  Exercise Prescription Changes:   Exercise Comments:   Exercise Goals and Review:   Exercise Goals     Row Name 08/23/21 1313             Exercise Goals   Increase Physical Activity Yes       Intervention Provide advice, education, support and counseling about physical activity/exercise needs.;Develop an individualized exercise prescription for aerobic and resistive training based on initial evaluation findings, risk stratification, comorbidities and participant's  personal goals.       Expected Outcomes Short Term: Attend rehab on a  regular basis to increase amount of physical activity.;Long Term: Exercising regularly at least 3-5 days a week.;Long Term: Add in home exercise to make exercise part of routine and to increase amount of physical activity.       Increase Strength and Stamina Yes       Intervention Provide advice, education, support and counseling about physical activity/exercise needs.;Develop an individualized exercise prescription for aerobic and resistive training based on initial evaluation findings, risk stratification, comorbidities and participant's personal goals.       Expected Outcomes Short Term: Increase workloads from initial exercise prescription for resistance, speed, and METs.;Short Term: Perform resistance training exercises routinely during rehab and add in resistance training at home;Long Term: Improve cardiorespiratory fitness, muscular endurance and strength as measured by increased METs and functional [facility] RMedical laboratory scientific [id number]<MEASUREMENT474PruAffiliated EMedical laboratory scientific [id number]<MEASUMedical laboratory scientific [id number]<MEASUREMENT474PrudencMedical laboratory scientific 6052of82[place]<MEASUREMENT474PrudenceRemoSurgicaMedical laboratory scientific 192of82KentuckMedical laboratory scieMedical laboratory scientific 566of82[place]<MEASUREMENT4Medical laboratory scientific 2174of82[facility]entuckSsm [facility] Cardinal GlennMedical laboratory scientific 7340of82[place]<MEASUREMENT474PrudenceRemonia RicTexas Health Reso1KansaMedical laboratory scientific [id number] laboratory scientific [id number]<MEASUREMENT474PrudenceRemoniCataraMedical laboratory scientific [id number]<MEASUREMENT474PrudeAMedical laboratory scientific [id number]<MEASUREEncompass Health RehabilMedical laboratory scientific [id number]<MEASUREMENT474PrudencTransylvania Community Medical laboratory scientific [id number]<MEASUREMENT474PrudenceRemonia RicSouthweThe Rehabilitation HoMedical laboratory scientific [id number]<MEASUREMENT474PrudWMedical laboratory scientific 11031of82[place]<MEASUREMENT474PrudenceRemonia RiHazMedical laboratory scientific [id number]<MEASUREMENT474PrudenceRemonia RicCentro Medical laboratory scientific [id number]<MEASUREMENT474PrudenMedical laboratory scientific [id number]<MEASUREMENT474PrudenceRemonia RicBellevue MediNorMedical laboratory scientific 6110of82[place]<MEASUREMENTMedical laboratory scientific [id number] laboratory scientific [id number] Center - PeabodyBroker.itrate of perceived exertion (RPE) scale Yes       Intervention Provide education and explanation on how to use RPE scale       Expected Outcomes Short Term: Able to use RPE daily in rehab to express subjective intensity level;Long Term:  Able to use RPE to guide intensity level when exercising independently       Knowledge and understanding of Target Heart Rate Range (THRR) Yes       Intervention Provide education and explanation of THRR including how the numbers were predicted and where they are located for reference       Expected Outcomes Short Term: Able to state/look up THRR;Long Term: Able to use THRR to govern intensity when exercising independently;Short Term: Able to use daily as guideline for intensity in rehab       Able to check pulse independently Yes       Intervention Provide education and demonstration on how to check pulse in carotid and radial arteries.;Review the  importance of being able to check your own pulse for safety during independent exercise       Expected Outcomes Short Term: Able to explain why pulse checking is important during independent exercise;Long Term: Able to check pulse independently and accurately       Understanding of Exercise Prescription Yes       Intervention Provide education, explanation, and written materials on patient's individual exercise prescription       Expected Outcomes Short Term: Able to explain program exercise prescription;Long Term: Able to explain home exercise prescription to exercise independently                Exercise Goals Re-Evaluation :    Discharge Exercise Prescription (Final Exercise Prescription Changes):   Nutrition:  Target Goals: Understanding of nutrition guidelines, daily intake of sodium 1500mg , cholesterol 200mg , calories 30% from fat and 7% or less from saturated fats, daily to have 5 or more servings of fruits and vegetables.  Biometrics:  Pre Biometrics - 08/23/21 1312       Pre Biometrics   Waist Circumference 42 inches    Hip Circumference 42.25 inches    Waist to Hip Ratio 0.99 %    Triceps Skinfold 9 mm    % Body Fat 28.2 %    Grip Strength 42 kg    Flexibility 0 in    Single Leg Stand 3.37 seconds              Nutrition Therapy Plan and Nutrition Goals:   Nutrition Assessments:  MEDIFICTS Score Key: ?70 Need to make dietary changes  40-70 Heart Healthy Diet ?  40 Therapeutic Level Cholesterol Diet   Picture Your Plate Scores: <82 Unhealthy dietary pattern with much room for improvement. 41-50 Dietary pattern unlikely to meet recommendations for good health and room for improvement. 51-60 More healthful dietary pattern, with some room for improvement.  >60 Healthy dietary pattern, although there may be some specific behaviors that could be improved.    Nutrition Goals Re-Evaluation:   Nutrition Goals Discharge (Final Nutrition Goals  Re-Evaluation):   Psychosocial: Target Goals: Acknowledge presence or absence of significant depression and/or stress, maximize coping skills, provide positive support system. Participant is able to verbalize types and ability to use techniques and skills needed for reducing stress and depression.  Initial Review & Psychosocial Screening:  Initial Psych Review & Screening - 08/23/21 1339       Initial Review   Current issues with Current Stress Concerns    Source of Stress Concerns Occupation    Comments Kathlene November says that he has some job stress as he works on [employer]. Kathlene November says that things are slow at his job since the COVID 19 pandemic.      Family Dynamics   Good Support System? Yes   Kathlene November has his wife and two children for support     Barriers   Psychosocial barriers to participate in program The patient should benefit from training in stress management and relaxation.      Screening Interventions   Interventions Encouraged to exercise    Expected Outcomes Long Term Goal: Stressors or current issues are controlled or eliminated.;Short Term goal: Identification and review with participant of any Quality of Life or Depression concerns found by scoring the questionnaire.             Quality of Life Scores:  Quality of Life - 08/23/21 1531       Quality of Life   Select Quality of Life      Quality of Life Scores   Health/Function Pre 24 %    Socioeconomic Pre 27 %    Psych/Spiritual Pre 25.5 %    Family Pre 25.2 %    GLOBAL Pre 25.16 %            Scores of 19 and below usually indicate a poorer quality of life in these areas.  A difference of  2-3 points is a clinically meaningful difference.  A difference of 2-3 points in the total score of the Quality of Life Index has been associated with significant improvement in overall quality of life, self-image, physical symptoms, and general health in studies assessing change in quality of life.  PHQ-9: Recent Review  Flowsheet Data     Depression screen [facility] 2/9 08/23/2021   Decreased Interest 0   Down, Depressed, Hopeless 0   PHQ - 2 Score 0      Interpretation of Total Score  Total Score Depression Severity:  1-4 = Minimal depression, 5-9 = Mild depression, 10-14 = Moderate depression, 15-19 = Moderately severe depression, 20-27 = Severe depression   Psychosocial Evaluation and Intervention:   Psychosocial Re-Evaluation:   Psychosocial Discharge (Final Psychosocial Re-Evaluation):   Vocational Rehabilitation: Provide vocational rehab assistance to qualifying candidates.   Vocational Rehab Evaluation & Intervention:  Vocational Rehab - 08/23/21 1344       Initial Vocational Rehab Evaluation & Intervention   Assessment shows need for Vocational Rehabilitation No   Kathlene November works full time and does not need vocational rehab            Education: Education  Goals: Education classes will be provided on a weekly basis, covering required topics. Participant will state understanding/return demonstration of topics presented.  Learning Barriers/Preferences:  Learning Barriers/Preferences - 08/23/21 1613       Learning Barriers/Preferences   Learning Barriers Sight   wears glasses   Learning Preferences Skilled Demonstration;Video;Pictoral             Education Topics: Hypertension, Hypertension Reduction -Define heart disease and high blood pressure. Discus how high blood pressure affects the body and ways to reduce high blood pressure.   Exercise and Your Heart -Discuss why it is important to exercise, the FITT principles of exercise, normal and abnormal responses to exercise, and how to exercise safely.   Angina -Discuss definition of angina, causes of angina, treatment of angina, and how to decrease risk of having angina.   Cardiac Medications -Review what the following cardiac medications are used for, how they affect the body, and side effects that may occur when taking  the medications.  Medications include Aspirin, Beta blockers, calcium channel blockers, ACE Inhibitors, angiotensin receptor blockers, diuretics, digoxin, and antihyperlipidemics.   Congestive Heart Failure -Discuss the definition of CHF, how to live with CHF, the signs and symptoms of CHF, and how keep track of weight and sodium intake.   Heart Disease and Intimacy -Discus the effect sexual activity has on the heart, how changes occur during intimacy as we age, and safety during sexual activity.   Smoking Cessation / COPD -Discuss different methods to quit smoking, the health benefits of quitting smoking, and the definition of COPD.   Nutrition I: Fats -Discuss the types of cholesterol, what cholesterol does to the heart, and how cholesterol levels can be controlled.   Nutrition II: Labels -Discuss the different components of food labels and how to read food label   Heart Parts/Heart Disease and PAD -Discuss the anatomy of the heart, the pathway of blood circulation through the heart, and these are affected by heart disease.   Stress I: Signs and Symptoms -Discuss the causes of stress, how stress may lead to anxiety and depression, and ways to limit stress.   Stress II: Relaxation -Discuss different types of relaxation techniques to limit stress.   Warning Signs of Stroke / TIA -Discuss definition of a stroke, what the signs and symptoms are of a stroke, and how to identify when someone is having stroke.   Knowledge Questionnaire Score:  Knowledge Questionnaire Score - 08/23/21 1516       Knowledge Questionnaire Score   Pre Score 22/24             Core Components/Risk Factors/Patient Goals at Admission:  Personal Goals and Risk Factors at Admission - 08/23/21 1325       Core Components/Risk Factors/Patient Goals on Admission    Weight Management Obesity;Weight Loss;Yes    Intervention Weight Management/Obesity: Establish reasonable short term and long term  weight goals.;Obesity: Provide education and appropriate resources to help participant work on and attain dietary goals.    Admit Weight 216 lb 7.9 oz (98.2 kg)    Expected Outcomes Short Term: Continue to assess and modify interventions until short term weight is achieved;Long Term: Adherence to nutrition and physical activity/exercise program aimed toward attainment of established weight goal;Weight Loss: Understanding of general recommendations for a balanced deficit meal plan, which promotes 1-2 lb weight loss per week and includes a negative energy balance of [phone] kcal/d    Hypertension Yes    Intervention Provide education on lifestyle modifcations  including regular physical activity/exercise, weight management, moderate sodium restriction and increased consumption of fresh fruit, vegetables, and low fat dairy, alcohol moderation, and smoking cessation.;Monitor prescription use compliance.    Expected Outcomes Short Term: Continued assessment and intervention until BP is < 140/36mm HG in hypertensive participants. < 130/14mm HG in hypertensive participants with diabetes, heart failure or chronic kidney disease.;Long Term: Maintenance of blood pressure at goal levels.    Lipids Yes    Intervention Provide education and support for participant on nutrition & aerobic/resistive exercise along with prescribed medications to achieve LDL 70mg , HDL >40mg .    Expected Outcomes Short Term: Participant states understanding of desired cholesterol values and is compliant with medications prescribed. Participant is following exercise prescription and nutrition guidelines.;Long Term: Cholesterol controlled with medications as prescribed, with individualized exercise RX and with personalized nutrition plan. Value goals: LDL < 70mg , HDL > 40 mg.    Personal Goal Other Yes    Personal Goal Reduce anxiety.    Intervention Offer individual education/counseling on adjustment to heart disease, stress management and  health related lifestyle change.    Expected Outcomes Participant demonstrates changes in health-related behavior, relaxation and other stress management skills.             Core Components/Risk Factors/Patient Goals Review:    Core Components/Risk Factors/Patient Goals at Discharge (Final Review):    ITP Comments:  ITP Comments     Row Name 08/23/21 1312           ITP Comments Medical Director- Dr. 08/25/21, MD                Comments: Armanda Magic attended orientation on 08/23/2021 to review rules and guidelines for program.  Completed 6 minute walk test, Intitial ITP, and exercise prescription.  VSS. Telemetry-Sinus Rhythm.  Asymptomatic. Safety measures and social distancing in place per CDC guidelines. 08/25/2021, RN,BSN 08/23/2021 4:16 PM

## 2021-08-23 NOTE — Telephone Encounter (Signed)
Spoke with patient. Completed health history confirmed appointment for this afternoon.Gladstone Lighter, RN,BSN 08/23/2021 8:20 AM

## 2021-08-23 NOTE — Progress Notes (Signed)
Cardiac Rehab Medication Review by a Nurse  Does the patient  feel that his/her medications are working for him/her?  YES   Has the patient been experiencing any side effects to the medications prescribed?  NO  Does the patient measure his/her own blood pressure or blood glucose at home?  YES   Does the patient have any problems obtaining medications due to transportation or finances?    NO  Understanding of regimen: good Understanding of indications: good Potential of compliance: excellent    Nurse comments: Kathlene November is taking his medications as prescribed and has a good understanding of what his medications are for. Kathlene November checks his blood pressures at home every day.    Arta Bruce Barre Aydelott RN 08/23/2021 1:30 PM

## 2021-08-26 NOTE — Progress Notes (Signed)
Subjective: 55 year old male presents the office today for concerns of discomfort in the ball of his left foot.  He states that the inserts are wearing down he is interested in a new inserts.  He denies any recent injury or trauma or any changes otherwise.  Orthotics were doing well but he is on his feet a lot.  No other concerns.  He had a heart attack in July.  Objective: AAO x3, NAD DP/PT pulses palpable bilaterally, CRT less than 3 seconds Significant decrease in medial arch upon weightbearing.  No area of pinpoint tenderness.  Decreased to get some pain submetatarsal area.  Occasional discomfort on the course the posterior tibial tendon but no significant tenderness identified today.  There is no area of pinpoint tenderness.  MMT 5/5. No pain with calf compression, swelling, warmth, erythema  Assessment: Posterior tibial tendon dysfunction, metatarsalgia  Plan: -All treatment options discussed with the patient including all alternatives, risks, complications.  -Discussion modifications and new orthotics.  We are now get a new pair of orthotics with the modifications that were done previously.  We took pictures we will send this with the updated molds.  I elected to the same orthotic with the modifications that were done in the office. -Discussed general stretching, rehab exercises as well. -Patient encouraged to call the office with any questions, concerns, change in symptoms.   Vivi Barrack DPM

## 2021-08-29 ENCOUNTER — Encounter (HOSPITAL_COMMUNITY)
Admission: RE | Admit: 2021-08-29 | Discharge: 2021-08-29 | Disposition: A | Discharge status: Home / Self Care | Payer: BC Managed Care – PPO | Source: Ambulatory Visit | Attending: Cardiovascular Disease | Admitting: Cardiovascular Disease

## 2021-08-29 ENCOUNTER — Other Ambulatory Visit: Payer: Self-pay

## 2021-08-29 DIAGNOSIS — Z955 Presence of coronary angioplasty implant and graft: Secondary | ICD-10-CM

## 2021-08-29 DIAGNOSIS — I213 ST elevation (STEMI) myocardial infarction of unspecified site: Secondary | ICD-10-CM | POA: Diagnosis not present

## 2021-08-29 NOTE — Progress Notes (Signed)
Daily Session Note  Patient Details  Name: Brandon Villanueva. MRN: [id number] Date of Birth: [birth year] Referring Provider:   Flowsheet Row CARDIAC REHAB PHASE II ORIENTATION from 08/23/2021 in [place]  Referring Provider Troy Sine, MD       Encounter Date: 08/29/2021  Check In:  Session Check In - 08/29/21 1532       Check-In   Supervising physician immediately available to respond to emergencies [facility] immediately available    Physician(s) Dr. Verlon Au    Location [facility]    Staff Present Seward Carol, MS, ACSM CEP, Exercise Physiologist;David Lilyan Punt, MS, ACSM-CEP, CCRP, Exercise Physiologist;Annedrea Rosezella [place], RN, MHA;Joaovictor Krone, RN, Maxcine Ham, RN, BSN    Virtual Visit No    Medication changes reported     No    Fall or balance concerns reported    No    Tobacco Cessation No Change    Warm-up and Cool-down Performed on first and last piece of equipment    Resistance Training Performed Yes    VAD Patient? No    PAD/SET Patient? No      Pain Assessment   Currently in Pain? No/denies    Pain Score 0-No pain    Multiple Pain Sites No             Capillary Blood Glucose: No results found for this or any previous visit (from the past 24 hour(s)).   Exercise Prescription Changes - 08/29/21 1512       Response to Exercise   Blood Pressure (Admit) 126/84    Blood Pressure (Exercise) 172/88    Blood Pressure (Exit) 134/82    Heart Rate (Admit) 83 bpm    Heart Rate (Exercise) 106 bpm    Heart Rate (Exit) 80 bpm    Rating of Perceived Exertion (Exercise) 12    Symptoms None    Comments Off to a great start with exercise.    Duration Continue with 30 min of aerobic exercise without signs/symptoms of physical distress.    Intensity THRR unchanged      Progression   Progression Continue to progress workloads to maintain intensity without signs/symptoms of physical distress.     Average METs 3.2      Resistance Training   Training Prescription Yes    Weight 4 lbs    Reps 10-15    Time 10 Minutes      Interval Training   Interval Training No      Bike   Level 2    Minutes 15    METs 3.7      NuStep   Level 3    SPM 93    Minutes 15    METs 2.7             Social History   Tobacco Use  Smoking Status Never  Smokeless Tobacco Never    Goals Met:  Exercise tolerated well No report of concerns or symptoms today Strength training completed today  Goals Unmet:  BP  Comments: Ronalee Belts started cardiac rehab today.  Pt tolerated light exercise without difficulty. Moderate exertional systolic BP elevations noted.Will continue to monitor BP, telemetry-Sinus Rhythm, asymptomatic.  Medication list reconciled. Pt denies barriers to medicaiton compliance.  PSYCHOSOCIAL ASSESSMENT:  PHQ-0. Pt exhibits positive coping skills, hopeful outlook with supportive family. No psychosocial needs identified at this time, no psychosocial interventions necessary.    Pt enjoys hiking , walking on the walking trails and  watching sports.   Pt oriented to exercise equipment and routine.    Understanding verbalized. Barnet Pall, RN,BSN 08/29/2021 5:21 PM    Dr. Fransico Him is Medical Director for Cardiac Rehab at [facility].

## 2021-08-31 ENCOUNTER — Encounter (HOSPITAL_COMMUNITY): Payer: BC Managed Care – PPO

## 2021-08-31 NOTE — Progress Notes (Signed)
Cardiac Individual Treatment Plan  Patient Details  Name: Brandon Villanueva. MRN: [id number] Date of Birth: 26-May-1966 Referring Provider:   Flowsheet Row CARDIAC REHAB PHASE II ORIENTATION from 08/23/2021 in [facility]  Referring Provider Troy Sine, MD       Initial Encounter Date:  [place] PHASE II ORIENTATION from 08/23/2021 in [facility]  Date 08/23/21       Visit Diagnosis: 05/27/21 STEMI  05/27/21 DES LAD  Patient's Home Medications on Admission:  Current Outpatient Medications:    aspirin EC 81 MG tablet, Take 81 mg by mouth in the morning. Swallow whole., Disp: , Rfl:    carvedilol (COREG) 6.25 MG tablet, Take 1 tablet (6.25 mg total) by mouth 2 (two) times daily with a meal., Disp: 60 tablet, Rfl: 6   Glucosamine-Chondroitin (COSAMIN DS PO), Take 2 tablets by mouth in the morning. MoveWell, Disp: , Rfl:    losartan (COZAAR) 25 MG tablet, Take 0.5 tablets (12.5 mg total) by mouth at bedtime., Disp: 45 tablet, Rfl: 3   magnesium oxide (MAG-OX) 400 MG tablet, Take 400 mg by mouth every evening., Disp: , Rfl:    metroNIDAZOLE (METROGEL) 1 % gel, Apply 1 application topically daily as needed (rosacea flares)., Disp: , Rfl:    nitroGLYCERIN (NITROSTAT) 0.4 MG SL tablet, Place 1 tablet (0.4 mg total) under the tongue every 5 (five) minutes as needed for chest pain (up to 3 doses. If taking 3rd dose call 911)., Disp: 25 tablet, Rfl: 3   OVER THE COUNTER MEDICATION, Take 1 capsule by mouth in the morning and at bedtime. doTERRA Deep Blue Polyphenol Complex (frankincense, turmeric, green tea, ginger, pomegranate, and grape seed), Disp: , Rfl:    rosuvastatin (CRESTOR) 20 MG tablet, Take 1 tablet (20 mg total) by mouth daily., Disp: 30 tablet, Rfl: 6   ticagrelor (BRILINTA) 90 MG TABS tablet, Take 1 tablet (90 mg total) by mouth 2 (two) times daily., Disp: 60 tablet, Rfl: 11  Past Medical  History: Past Medical History:  Diagnosis Date   Arthritis    CAD in native artery    a. STEMI 04/2021 s/p DES to LAD.   Essential hypertension    Hyperlipidemia with target LDL less than 70 02/02/2016   Hypertension    Morbid obesity ([place]) 07/07/2015   OSA (obstructive sleep apnea) 09/28/2015   Severe with AHI 47/hr with successful CPAP titration to 11cm H2O   Recurrent right inguinal hernia s/p lap repair with mesh 06/09/14 06/01/2014    Tobacco Use: Social History   Tobacco Use  Smoking Status Never  Smokeless Tobacco Never    Labs: Recent Review Flowsheet Data     Labs for ITP Cardiac and Pulmonary Rehab Latest Ref Rng & Units 03/01/2017 06/05/2018 07/24/2018 05/27/2021 07/08/2021   Cholestrol 100 - 199 mg/dL 140 142 139 174 102   LDLCALC 0 - 99 mg/dL 76 83 80 109(H) 44   HDL >39 mg/dL 56 46 49 48 44   Trlycerides 0 - 149 mg/dL 42 63 51 87 62   Hemoglobin A1c 4.8 - 5.6 % - - - 5.7(H) -   TCO2 22 - 32 mmol/L - - - 19(L) -       Capillary Blood Glucose: No results found for: GLUCAP   Exercise Target Goals: Exercise Program Goal: Individual exercise prescription set using results from initial 6 min walk test and THRR while considering  patient's activity barriers  and safety.   Exercise Prescription Goal: Initial exercise prescription builds to 30-45 minutes a day of aerobic activity, 2-3 days per week.  Home exercise guidelines will be given to patient during program as part of exercise prescription that the participant will acknowledge.  Activity Barriers & Risk Stratification:  Activity Barriers & Cardiac Risk Stratification - 08/23/21 1325       Activity Barriers & Cardiac Risk Stratification   Activity Barriers Arthritis;Other (comment);Balance Concerns    Comments Bilateral fallen arches, bilateral knee suregery: meniscus trimmed.    Cardiac Risk Stratification High             6 Minute Walk:  6 Minute Walk     Row Name 08/23/21 1347         6 Minute  Walk   Phase Initial     Distance 1722 feet     Walk Time 6 minutes     # of Rest Breaks 0     MPH 3.26     METS 4.34     RPE 12     Perceived Dyspnea  0     VO2 Peak 15.18     Symptoms No     Resting HR 79 bpm     Resting BP 124/70     Resting Oxygen Saturation  96 %     Exercise Oxygen Saturation  during 6 min walk 97 %     Max Ex. HR 103 bpm     Max Ex. BP 156/78     2 Minute Post BP 128/78              Oxygen Initial Assessment:   Oxygen Re-Evaluation:   Oxygen Discharge (Final Oxygen Re-Evaluation):   Initial Exercise Prescription:  Initial Exercise Prescription - 08/23/21 1500       Date of Initial Exercise RX and Referring Provider   Date 08/23/21    Referring Provider Lennette Bihari, MD    Expected Discharge Date 10/21/21      Bike   Level 2    Minutes 15    METs 3.2      NuStep   Level 3    SPM 85    Minutes 15    METs 3.2      Prescription Details   Frequency (times per week) 3    Duration Progress to 30 minutes of continuous aerobic without signs/symptoms of physical distress      Intensity   THRR 40-80% of Max Heartrate 66-132    Ratings of Perceived Exertion 11-13    Perceived Dyspnea 0-4      Progression   Progression Continue to progress workloads to maintain intensity without signs/symptoms of physical distress.      Resistance Training   Training Prescription Yes    Weight 4 lbs    Reps 10-15             Perform Capillary Blood Glucose checks as needed.  Exercise Prescription Changes:   Exercise Prescription Changes     Row Name 08/29/21 1512             Response to Exercise   Blood Pressure (Admit) 126/84       Blood Pressure (Exercise) 172/88       Blood Pressure (Exit) 134/82       Heart Rate (Admit) 83 bpm       Heart Rate (Exercise) 106 bpm       Heart Rate (Exit) 80 bpm  Rating of Perceived Exertion (Exercise) 12       Symptoms None       Comments Off to a great start with exercise.        Duration Continue with 30 min of aerobic exercise without signs/symptoms of physical distress.       Intensity THRR unchanged         Progression   Progression Continue to progress workloads to maintain intensity without signs/symptoms of physical distress.       Average METs 3.2         Resistance Training   Training Prescription Yes       Weight 4 lbs       Reps 10-15       Time 10 Minutes         Interval Training   Interval Training No         Bike   Level 2       Minutes 15       METs 3.7         NuStep   Level 3       SPM 93       Minutes 15       METs 2.7                Exercise Comments:   Exercise Comments     Row Name 08/29/21 1604           Exercise Comments Patient tolerated 1st session of exercise well without symptoms. Oriented to weights and stretches.                Exercise Goals and Review:   Exercise Goals     Row Name 08/23/21 1313             Exercise Goals   Increase Physical Activity Yes       Intervention Provide advice, education, support and counseling about physical activity/exercise needs.;Develop an individualized exercise prescription for aerobic and resistive training based on initial evaluation findings, risk stratification, comorbidities and participant's personal goals.       Expected Outcomes Short Term: Attend rehab on a regular basis to increase amount of physical activity.;Long Term: Exercising regularly at least 3-5 days a week.;Long Term: Add in home exercise to make exercise part of routine and to increase amount of physical activity.       Increase Strength and Stamina Yes       Intervention Provide advice, education, support and counseling about physical activity/exercise needs.;Develop an individualized exercise prescription for aerobic and resistive training based on initial evaluation findings, risk stratification, comorbidities and participant's personal goals.       Expected Outcomes Short Term: Increase  workloads from initial exercise prescription for resistance, speed, and METs.;Short Term: Perform resistance training exercises routinely during rehab and add in resistance training at home;Long Term: Improve cardiorespiratory fitness, muscular endurance and strength as measured by increased METs and functional capacity ( )       Able to understand and use rate of perceived exertion (RPE) scale Yes       Intervention Provide education and explanation on how to use RPE scale       Expected Outcomes Short Term: Able to use RPE daily in rehab to express subjective intensity level;Long Term:  Able to use RPE to guide intensity level when exercising independently       Knowledge and understanding of Target Heart Rate Range (THRR) Yes  Intervention Provide education and explanation of THRR including how the numbers were predicted and where they are located for reference       Expected Outcomes Short Term: Able to state/look up THRR;Long Term: Able to use THRR to govern intensity when exercising independently;Short Term: Able to use daily as guideline for intensity in rehab       Able to check pulse independently Yes       Intervention Provide education and demonstration on how to check pulse in carotid and radial arteries.;Review the importance of being able to check your own pulse for safety during independent exercise       Expected Outcomes Short Term: Able to explain why pulse checking is important during independent exercise;Long Term: Able to check pulse independently and accurately       Understanding of Exercise Prescription Yes       Intervention Provide education, explanation, and written materials on patient's individual exercise prescription       Expected Outcomes Short Term: Able to explain program exercise prescription;Long Term: Able to explain home exercise prescription to exercise independently                Exercise Goals Re-Evaluation :  Exercise Goals Re-Evaluation      Row Name 08/29/21 1604             Exercise Goal Re-Evaluation   Exercise Goals Review Increase Physical Activity;Able to understand and use rate of perceived exertion (RPE) scale       Comments Patient able to understand and use RPE scale appropriately.       Expected Outcomes Progress workloads as tolerated to help improve cardiorespiratory fitness.                Discharge Exercise Prescription (Final Exercise Prescription Changes):  Exercise Prescription Changes - 08/29/21 1512       Response to Exercise   Blood Pressure (Admit) 126/84    Blood Pressure (Exercise) 172/88    Blood Pressure (Exit) 134/82    Heart Rate (Admit) 83 bpm    Heart Rate (Exercise) 106 bpm    Heart Rate (Exit) 80 bpm    Rating of Perceived Exertion (Exercise) 12    Symptoms None    Comments Off to a great start with exercise.    Duration Continue with 30 min of aerobic exercise without signs/symptoms of physical distress.    Intensity THRR unchanged      Progression   Progression Continue to progress workloads to maintain intensity without signs/symptoms of physical distress.    Average METs 3.2      Resistance Training   Training Prescription Yes    Weight 4 lbs    Reps 10-15    Time 10 Minutes      Interval Training   Interval Training No      Bike   Level 2    Minutes 15    METs 3.7      NuStep   Level 3    SPM 93    Minutes 15    METs 2.7             Nutrition:  Target Goals: Understanding of nutrition guidelines, daily intake of sodium 1500mg , cholesterol 200mg , calories 30% from fat and 7% or less from saturated fats, daily to have 5 or more servings of fruits and vegetables.  Biometrics:  Pre Biometrics - 08/23/21 1312       Pre Biometrics   Waist Circumference 42 inches  Hip Circumference 42.25 inches    Waist to Hip Ratio 0.99 %    Triceps Skinfold 9 mm    % Body Fat 28.2 %    Grip Strength 42 kg    Flexibility 0 in    Single Leg Stand 3.37 seconds               Nutrition Therapy Plan and Nutrition Goals:   Nutrition Assessments:  MEDIFICTS Score Key: ?70 Need to make dietary changes  40-70 Heart Healthy Diet ? 40 Therapeutic Level Cholesterol Diet    Picture Your Plate Scores: <16 Unhealthy dietary pattern with much room for improvement. 41-50 Dietary pattern unlikely to meet recommendations for good health and room for improvement. 51-60 More healthful dietary pattern, with some room for improvement.  >60 Healthy dietary pattern, although there may be some specific behaviors that could be improved.    Nutrition Goals Re-Evaluation:   Nutrition Goals Re-Evaluation:   Nutrition Goals Discharge (Final Nutrition Goals Re-Evaluation):   Psychosocial: Target Goals: Acknowledge presence or absence of significant depression and/or stress, maximize coping skills, provide positive support system. Participant is able to verbalize types and ability to use techniques and skills needed for reducing stress and depression.  Initial Review & Psychosocial Screening:  Initial Psych Review & Screening - 08/23/21 1339       Initial Review   Current issues with Current Stress Concerns    Source of Stress Concerns Occupation    Comments Kathlene November says that he has some job stress as he works on [employer]. Kathlene November says that things are slow at his job since the COVID 19 pandemic.      Family Dynamics   Good Support System? Yes   Kathlene November has his wife and two children for support     Barriers   Psychosocial barriers to participate in program The patient should benefit from training in stress management and relaxation.      Screening Interventions   Interventions Encouraged to exercise    Expected Outcomes Long Term Goal: Stressors or current issues are controlled or eliminated.;Short Term goal: Identification and review with participant of any Quality of Life or Depression concerns found by scoring the questionnaire.              Quality of Life Scores:  Quality of Life - 08/23/21 1531       Quality of Life   Select Quality of Life      Quality of Life Scores   Health/Function Pre 24 %    Socioeconomic Pre 27 %    Psych/Spiritual Pre 25.5 %    Family Pre 25.2 %    GLOBAL Pre 25.16 %            Scores of 19 and below usually indicate a poorer quality of life in these areas.  A difference of  2-3 points is a clinically meaningful difference.  A difference of 2-3 points in the total score of the Quality of Life Index has been associated with significant improvement in overall quality of life, self-image, physical symptoms, and general health in studies assessing change in quality of life.  PHQ-9: Recent Review Flowsheet Data     Depression screen [facility] 2/9 08/23/2021   Decreased Interest 0   Down, Depressed, Hopeless 0   PHQ - 2 Score 0      Interpretation of Total Score  Total Score Depression Severity:  1-4 = Minimal depression, 5-9 = Mild depression, 10-14 = Moderate depression, 15-19 =  Moderately severe depression, 20-27 = Severe depression   Psychosocial Evaluation and Intervention:   Psychosocial Re-Evaluation:   Psychosocial Discharge (Final Psychosocial Re-Evaluation):   Vocational Rehabilitation: Provide vocational rehab assistance to qualifying candidates.   Vocational Rehab Evaluation & Intervention:  Vocational Rehab - 08/23/21 1344       Initial Vocational Rehab Evaluation & Intervention   Assessment shows need for Vocational Rehabilitation No   Kathlene November works full time and does not need vocational rehab            Education: Education Goals: Education classes will be provided on a weekly basis, covering required topics. Participant will state understanding/return demonstration of topics presented.  Learning Barriers/Preferences:  Learning Barriers/Preferences - 08/23/21 1613       Learning Barriers/Preferences   Learning Barriers Sight   wears glasses   Learning  Preferences Skilled Demonstration;Video;Pictoral             Education Topics: Count Your Pulse:  -Group instruction provided by verbal instruction, demonstration, patient participation and written materials to support subject.  Instructors address importance of being able to find your pulse and how to count your pulse when at home without a heart monitor.  Patients get hands on experience counting their pulse with staff help and individually.   Heart Attack, Angina, and Risk Factor Modification:  -Group instruction provided by verbal instruction, video, and written materials to support subject.  Instructors address signs and symptoms of angina and heart attacks.    Also discuss risk factors for heart disease and how to make changes to improve heart health risk factors.   Functional Fitness:  -Group instruction provided by verbal instruction, demonstration, patient participation, and written materials to support subject.  Instructors address safety measures for doing things around the house.  Discuss how to get up and down off the floor, how to pick things up properly, how to safely get out of a chair without assistance, and balance training.   Meditation and Mindfulness:  -Group instruction provided by verbal instruction, patient participation, and written materials to support subject.  Instructor addresses importance of mindfulness and meditation practice to help reduce stress and improve awareness.  Instructor also leads participants through a meditation exercise.    Stretching for Flexibility and Mobility:  -Group instruction provided by verbal instruction, patient participation, and written materials to support subject.  Instructors lead participants through series of stretches that are designed to increase flexibility thus improving mobility.  These stretches are additional exercise for major muscle groups that are typically performed during regular warm up and cool down.   Hands  Only CPR:  -Group verbal, video, and participation provides a basic overview of AHA guidelines for community CPR. Role-play of emergencies allow participants the opportunity to practice calling for help and chest compression technique with discussion of AED use.   Hypertension: -Group verbal and written instruction that provides a basic overview of hypertension including the most recent diagnostic guidelines, risk factor reduction with self-care instructions and medication management.    Nutrition I class: Heart Healthy Eating:  -Group instruction provided by PowerPoint slides, verbal discussion, and written materials to support subject matter. The instructor gives an explanation and review of the Therapeutic Lifestyle Changes diet recommendations, which includes a discussion on lipid goals, dietary fat, sodium, fiber, plant stanol/sterol esters, sugar, and the components of a well-balanced, healthy diet.   Nutrition II class: Lifestyle Skills:  -Group instruction provided by PowerPoint slides, verbal discussion, and written materials to support subject matter. The  instructor gives an explanation and review of label reading, grocery shopping for heart health, heart healthy recipe modifications, and ways to make healthier choices when eating out.   Diabetes Question & Answer:  -Group instruction provided by PowerPoint slides, verbal discussion, and written materials to support subject matter. The instructor gives an explanation and review of diabetes co-morbidities, pre- and post-prandial blood glucose goals, pre-exercise blood glucose goals, signs, symptoms, and treatment of hypoglycemia and hyperglycemia, and foot care basics.   Diabetes Blitz:  -Group instruction provided by PowerPoint slides, verbal discussion, and written materials to support subject matter. The instructor gives an explanation and review of the physiology behind type 1 and type 2 diabetes, diabetes medications and rational  behind using different medications, pre- and post-prandial blood glucose recommendations and Hemoglobin A1c goals, diabetes diet, and exercise including blood glucose guidelines for exercising safely.    Portion Distortion:  -Group instruction provided by PowerPoint slides, verbal discussion, written materials, and food models to support subject matter. The instructor gives an explanation of serving size versus portion size, changes in portions sizes over the last 20 years, and what consists of a serving from each food group.   Stress Management:  -Group instruction provided by verbal instruction, video, and written materials to support subject matter.  Instructors review role of stress in heart disease and how to cope with stress positively.     Exercising on Your Own:  -Group instruction provided by verbal instruction, power point, and written materials to support subject.  Instructors discuss benefits of exercise, components of exercise, frequency and intensity of exercise, and end points for exercise.  Also discuss use of nitroglycerin and activating EMS.  Review options of places to exercise outside of rehab.  Review guidelines for sex with heart disease.   Cardiac Drugs I:  -Group instruction provided by verbal instruction and written materials to support subject.  Instructor reviews cardiac drug classes: antiplatelets, anticoagulants, beta blockers, and statins.  Instructor discusses reasons, side effects, and lifestyle considerations for each drug class.   Cardiac Drugs II:  -Group instruction provided by verbal instruction and written materials to support subject.  Instructor reviews cardiac drug classes: angiotensin converting enzyme inhibitors (ACE-I), angiotensin II receptor blockers (ARBs), nitrates, and calcium channel blockers.  Instructor discusses reasons, side effects, and lifestyle considerations for each drug class.   Anatomy and Physiology of the Circulatory System:   Group verbal and written instruction and models provide basic cardiac anatomy and physiology, with the coronary electrical and arterial systems. Review of: AMI, Angina, Valve disease, Heart Failure, Peripheral Artery Disease, Cardiac Arrhythmia, Pacemakers, and the ICD.   Other Education:  -Group or individual verbal, written, or video instructions that support the educational goals of the cardiac rehab program.   Holiday Eating Survival Tips:  -Group instruction provided by PowerPoint slides, verbal discussion, and written materials to support subject matter. The instructor gives patients tips, tricks, and techniques to help them not only survive but enjoy the holidays despite the onslaught of food that accompanies the holidays.   Knowledge Questionnaire Score:  Knowledge Questionnaire Score - 08/23/21 1516       Knowledge Questionnaire Score   Pre Score 22/24             Core Components/Risk Factors/Patient Goals at Admission:  Personal Goals and Risk Factors at Admission - 08/23/21 1325       Core Components/Risk Factors/Patient Goals on Admission    Weight Management Obesity;Weight Loss;Yes    Intervention Weight Management/Obesity:  Establish reasonable short term and long term weight goals.;Obesity: Provide education and appropriate resources to help participant work on and attain dietary goals.    Admit Weight 216 lb 7.9 oz (98.2 kg)    Expected Outcomes Short Term: Continue to assess and modify interventions until short term weight is achieved;Long Term: Adherence to nutrition and physical activity/exercise program aimed toward attainment of established weight goal;Weight Loss: Understanding of general recommendations for a balanced deficit meal plan, which promotes 1-2 lb weight loss per week and includes a negative energy balance of [phone] kcal/d    Hypertension Yes    Intervention Provide education on lifestyle modifcations including regular physical activity/exercise,  weight management, moderate sodium restriction and increased consumption of fresh fruit, vegetables, and low fat dairy, alcohol moderation, and smoking cessation.;Monitor prescription use compliance.    Expected Outcomes Short Term: Continued assessment and intervention until BP is < 140/7mm HG in hypertensive participants. < 130/58mm HG in hypertensive participants with diabetes, heart failure or chronic kidney disease.;Long Term: Maintenance of blood pressure at goal levels.    Lipids Yes    Intervention Provide education and support for participant on nutrition & aerobic/resistive exercise along with prescribed medications to achieve LDL 70mg , HDL >40mg .    Expected Outcomes Short Term: Participant states understanding of desired cholesterol values and is compliant with medications prescribed. Participant is following exercise prescription and nutrition guidelines.;Long Term: Cholesterol controlled with medications as prescribed, with individualized exercise RX and with personalized nutrition plan. Value goals: LDL < 70mg , HDL > 40 mg.    Personal Goal Other Yes    Personal Goal Reduce anxiety.    Intervention Offer individual education/counseling on adjustment to heart disease, stress management and health related lifestyle change.    Expected Outcomes Participant demonstrates changes in health-related behavior, relaxation and other stress management skills.             Core Components/Risk Factors/Patient Goals Review:   Goals and Risk Factor Review     Row Name 08/29/21 1714             Core Components/Risk Factors/Patient Goals Review   Personal Goals Review Weight Management/Obesity;Stress;Hypertension;Lipids       Review 08/31/21 started CR on 08/29/21 and did well with exercise. Moderate exertional BP elevations noted today will continue to monitor BP       Expected Outcomes 08/31/21 will continue to participate in phase 2 cardiac rehab for exercise, nutrtitiona andlifestyle  modifications                Core Components/Risk Factors/Patient Goals at Discharge (Final Review):   Goals and Risk Factor Review - 08/29/21 1714       Core Components/Risk Factors/Patient Goals Review   Personal Goals Review Weight Management/Obesity;Stress;Hypertension;Lipids    Review 08/31/21 started CR on 08/29/21 and did well with exercise. Moderate exertional BP elevations noted today will continue to monitor BP    Expected Outcomes 08/31/21 will continue to participate in phase 2 cardiac rehab for exercise, nutrtitiona andlifestyle modifications             ITP Comments:  ITP Comments     Row Name 08/23/21 1312 08/29/21 1712         ITP Comments Medical Director- Dr. 08/31/21, MD 30 Day ITP Review. Armanda Magic started cardiac rehab on 08/29/21 and did well with exercise.               Comments: See ITP Comments

## 2021-09-02 ENCOUNTER — Other Ambulatory Visit: Payer: Self-pay

## 2021-09-02 ENCOUNTER — Encounter (HOSPITAL_COMMUNITY)
Admission: RE | Admit: 2021-09-02 | Discharge: 2021-09-02 | Disposition: A | Discharge status: Home / Self Care | Payer: BC Managed Care – PPO | Source: Ambulatory Visit | Attending: Cardiovascular Disease | Admitting: Cardiovascular Disease

## 2021-09-02 DIAGNOSIS — I213 ST elevation (STEMI) myocardial infarction of unspecified site: Secondary | ICD-10-CM | POA: Diagnosis not present

## 2021-09-02 DIAGNOSIS — Z955 Presence of coronary angioplasty implant and graft: Secondary | ICD-10-CM | POA: Insufficient documentation

## 2021-09-05 ENCOUNTER — Other Ambulatory Visit: Payer: Self-pay

## 2021-09-05 ENCOUNTER — Encounter (HOSPITAL_COMMUNITY)
Admission: RE | Admit: 2021-09-05 | Discharge: 2021-09-05 | Disposition: A | Discharge status: Home / Self Care | Payer: BC Managed Care – PPO | Source: Ambulatory Visit | Attending: Cardiovascular Disease | Admitting: Cardiovascular Disease

## 2021-09-05 DIAGNOSIS — Z955 Presence of coronary angioplasty implant and graft: Secondary | ICD-10-CM | POA: Diagnosis not present

## 2021-09-05 DIAGNOSIS — I213 ST elevation (STEMI) myocardial infarction of unspecified site: Secondary | ICD-10-CM | POA: Diagnosis not present

## 2021-09-07 ENCOUNTER — Other Ambulatory Visit: Payer: Self-pay

## 2021-09-07 ENCOUNTER — Encounter (HOSPITAL_COMMUNITY)
Admission: RE | Admit: 2021-09-07 | Discharge: 2021-09-07 | Disposition: A | Discharge status: Home / Self Care | Payer: BC Managed Care – PPO | Source: Ambulatory Visit | Attending: Cardiovascular Disease | Admitting: Cardiovascular Disease

## 2021-09-07 DIAGNOSIS — I213 ST elevation (STEMI) myocardial infarction of unspecified site: Secondary | ICD-10-CM | POA: Diagnosis not present

## 2021-09-07 DIAGNOSIS — Z955 Presence of coronary angioplasty implant and graft: Secondary | ICD-10-CM

## 2021-09-09 ENCOUNTER — Other Ambulatory Visit: Payer: Self-pay

## 2021-09-09 ENCOUNTER — Encounter (HOSPITAL_COMMUNITY)
Admission: RE | Admit: 2021-09-09 | Discharge: 2021-09-09 | Disposition: A | Discharge status: Home / Self Care | Payer: BC Managed Care – PPO | Source: Ambulatory Visit | Attending: Cardiovascular Disease | Admitting: Cardiovascular Disease

## 2021-09-09 DIAGNOSIS — I213 ST elevation (STEMI) myocardial infarction of unspecified site: Secondary | ICD-10-CM | POA: Diagnosis not present

## 2021-09-09 DIAGNOSIS — Z955 Presence of coronary angioplasty implant and graft: Secondary | ICD-10-CM

## 2021-09-12 ENCOUNTER — Encounter (HOSPITAL_COMMUNITY): Payer: BC Managed Care – PPO

## 2021-09-12 ENCOUNTER — Other Ambulatory Visit: Payer: Self-pay

## 2021-09-12 ENCOUNTER — Encounter (HOSPITAL_COMMUNITY)
Admission: RE | Admit: 2021-09-12 | Discharge: 2021-09-12 | Disposition: A | Discharge status: Home / Self Care | Payer: BC Managed Care – PPO | Source: Ambulatory Visit | Attending: Cardiovascular Disease | Admitting: Cardiovascular Disease

## 2021-09-12 DIAGNOSIS — Z955 Presence of coronary angioplasty implant and graft: Secondary | ICD-10-CM

## 2021-09-12 DIAGNOSIS — I213 ST elevation (STEMI) myocardial infarction of unspecified site: Secondary | ICD-10-CM | POA: Diagnosis not present

## 2021-09-14 ENCOUNTER — Encounter (HOSPITAL_COMMUNITY): Payer: BC Managed Care – PPO

## 2021-09-16 ENCOUNTER — Encounter (HOSPITAL_COMMUNITY): Payer: BC Managed Care – PPO

## 2021-09-16 ENCOUNTER — Telehealth (HOSPITAL_COMMUNITY): Payer: Self-pay | Admitting: Family Medicine

## 2021-09-19 ENCOUNTER — Encounter (HOSPITAL_COMMUNITY): Payer: BC Managed Care – PPO

## 2021-09-19 ENCOUNTER — Encounter (HOSPITAL_COMMUNITY)
Admission: RE | Admit: 2021-09-19 | Discharge: 2021-09-19 | Disposition: A | Discharge status: Home / Self Care | Payer: BC Managed Care – PPO | Source: Ambulatory Visit | Attending: Cardiovascular Disease | Admitting: Cardiovascular Disease

## 2021-09-19 ENCOUNTER — Other Ambulatory Visit: Payer: Self-pay

## 2021-09-19 DIAGNOSIS — G4733 Obstructive sleep apnea (adult) (pediatric): Secondary | ICD-10-CM | POA: Diagnosis not present

## 2021-09-19 DIAGNOSIS — I213 ST elevation (STEMI) myocardial infarction of unspecified site: Secondary | ICD-10-CM | POA: Diagnosis not present

## 2021-09-19 DIAGNOSIS — Z955 Presence of coronary angioplasty implant and graft: Secondary | ICD-10-CM | POA: Diagnosis not present

## 2021-09-19 NOTE — Progress Notes (Signed)
CARDIAC REHAB PHASE 2  Reviewed home exercise with pt today. Pt is tolerating exercise well. Pt will continue to exercise on their own by walking for 30-45 minutes per session 2-4 days a week in addition to the 3 days in [facility]2. Pt expressed concern about walking outside during the cold months and recently visited [place] and enjoyed it, I will put in a referral today. Advised pt on THRR, RPE scale, hydration and temperature/humidity precautions. Reinforced NTG use, S/S to stop exercise and when to call MD vs 911. Encouraged warm up cool down and stretches with exercise sessions. Pt verbalized understanding, all questions were answered and pt was given a copy to take home.    Harrie Jeans ACSM-EP 09/19/2021 4:43 PM

## 2021-09-21 ENCOUNTER — Encounter (HOSPITAL_COMMUNITY): Payer: BC Managed Care – PPO

## 2021-09-23 ENCOUNTER — Encounter (HOSPITAL_COMMUNITY): Payer: BC Managed Care – PPO

## 2021-09-26 ENCOUNTER — Encounter (HOSPITAL_COMMUNITY): Payer: BC Managed Care – PPO

## 2021-09-26 ENCOUNTER — Other Ambulatory Visit: Payer: Self-pay

## 2021-09-26 ENCOUNTER — Encounter (HOSPITAL_COMMUNITY)
Admission: RE | Admit: 2021-09-26 | Discharge: 2021-09-26 | Disposition: A | Discharge status: Home / Self Care | Payer: BC Managed Care – PPO | Source: Ambulatory Visit | Attending: Cardiovascular Disease | Admitting: Cardiovascular Disease

## 2021-09-26 DIAGNOSIS — I213 ST elevation (STEMI) myocardial infarction of unspecified site: Secondary | ICD-10-CM

## 2021-09-26 DIAGNOSIS — Z955 Presence of coronary angioplasty implant and graft: Secondary | ICD-10-CM

## 2021-09-27 ENCOUNTER — Ambulatory Visit: Payer: BC Managed Care – PPO

## 2021-09-27 DIAGNOSIS — M2142 Flat foot [pes planus] (acquired), left foot: Secondary | ICD-10-CM

## 2021-09-27 DIAGNOSIS — M7741 Metatarsalgia, right foot: Secondary | ICD-10-CM

## 2021-09-27 DIAGNOSIS — M2141 Flat foot [pes planus] (acquired), right foot: Secondary | ICD-10-CM

## 2021-09-28 ENCOUNTER — Encounter (HOSPITAL_COMMUNITY)
Admission: RE | Admit: 2021-09-28 | Discharge: 2021-09-28 | Disposition: A | Discharge status: Home / Self Care | Payer: BC Managed Care – PPO | Source: Ambulatory Visit | Attending: Cardiovascular Disease | Admitting: Cardiovascular Disease

## 2021-09-28 ENCOUNTER — Encounter (HOSPITAL_COMMUNITY): Payer: BC Managed Care – PPO

## 2021-09-28 ENCOUNTER — Other Ambulatory Visit: Payer: Self-pay

## 2021-09-28 DIAGNOSIS — Z955 Presence of coronary angioplasty implant and graft: Secondary | ICD-10-CM | POA: Diagnosis not present

## 2021-09-28 DIAGNOSIS — I213 ST elevation (STEMI) myocardial infarction of unspecified site: Secondary | ICD-10-CM | POA: Diagnosis not present

## 2021-09-28 NOTE — Progress Notes (Signed)
SITUATION: Reason for Visit: Fitting and Delivery of Custom Fabricated Foot Orthoses Patient Report: Patient reports comfort and is satisfied with device.  OBJECTIVE DATA: Patient History / Diagnosis:  No change in pathology Provided Device:  Custom functional foot orthotics  GOAL OF ORTHOSIS - Improve gait - Decrease energy expenditure - Improve Balance - Provide Triplanar stability of foot complex - Facilitate motion  ACTIONS PERFORMED Patient was fit with foot orthotics trimmed to shoe last. Patient tolerated fittign procedure. Device was modified as follows to better fit patient: - Toe plate was trimmed to shoe last  Patient was provided with verbal and written instruction and demonstration regarding donning, doffing, wear, care, proper fit, function, purpose, cleaning, and use of the orthosis and in all related precautions and risks and benefits regarding the orthosis.  Patient was also provided with verbal instruction regarding how to report any failures or malfunctions of the orthosis and necessary follow up care. Patient was also instructed to contact our office regarding any change in status that may affect the function of the orthosis.  Patient demonstrated independence with proper donning, doffing, and fit and verbalized understanding of all instructions.  PLAN: Patient is to follow up in one week or as necessary (PRN). All questions were answered and concerns addressed. Plan of care was discussed with and agreed upon by the patient.  

## 2021-09-28 NOTE — Progress Notes (Signed)
Cardiac Individual Treatment Plan  Patient Details  Name: Brandon Villanueva. MRN: [id number] Date of Birth: 26-May-1966 Referring Provider:   Flowsheet Row CARDIAC REHAB PHASE II ORIENTATION from 08/23/2021 in [facility]  Referring Provider Troy Sine, MD       Initial Encounter Date:  [place] PHASE II ORIENTATION from 08/23/2021 in [facility]  Date 08/23/21       Visit Diagnosis: 05/27/21 STEMI  05/27/21 DES LAD  Patient's Home Medications on Admission:  Current Outpatient Medications:    aspirin EC 81 MG tablet, Take 81 mg by mouth in the morning. Swallow whole., Disp: , Rfl:    carvedilol (COREG) 6.25 MG tablet, Take 1 tablet (6.25 mg total) by mouth 2 (two) times daily with a meal., Disp: 60 tablet, Rfl: 6   Glucosamine-Chondroitin (COSAMIN DS PO), Take 2 tablets by mouth in the morning. MoveWell, Disp: , Rfl:    losartan (COZAAR) 25 MG tablet, Take 0.5 tablets (12.5 mg total) by mouth at bedtime., Disp: 45 tablet, Rfl: 3   magnesium oxide (MAG-OX) 400 MG tablet, Take 400 mg by mouth every evening., Disp: , Rfl:    metroNIDAZOLE (METROGEL) 1 % gel, Apply 1 application topically daily as needed (rosacea flares)., Disp: , Rfl:    nitroGLYCERIN (NITROSTAT) 0.4 MG SL tablet, Place 1 tablet (0.4 mg total) under the tongue every 5 (five) minutes as needed for chest pain (up to 3 doses. If taking 3rd dose call 911)., Disp: 25 tablet, Rfl: 3   OVER THE COUNTER MEDICATION, Take 1 capsule by mouth in the morning and at bedtime. doTERRA Deep Blue Polyphenol Complex (frankincense, turmeric, green tea, ginger, pomegranate, and grape seed), Disp: , Rfl:    rosuvastatin (CRESTOR) 20 MG tablet, Take 1 tablet (20 mg total) by mouth daily., Disp: 30 tablet, Rfl: 6   ticagrelor (BRILINTA) 90 MG TABS tablet, Take 1 tablet (90 mg total) by mouth 2 (two) times daily., Disp: 60 tablet, Rfl: 11  Past Medical  History: Past Medical History:  Diagnosis Date   Arthritis    CAD in native artery    a. STEMI 04/2021 s/p DES to LAD.   Essential hypertension    Hyperlipidemia with target LDL less than 70 02/02/2016   Hypertension    Morbid obesity ([place]) 07/07/2015   OSA (obstructive sleep apnea) 09/28/2015   Severe with AHI 47/hr with successful CPAP titration to 11cm H2O   Recurrent right inguinal hernia s/p lap repair with mesh 06/09/14 06/01/2014    Tobacco Use: Social History   Tobacco Use  Smoking Status Never  Smokeless Tobacco Never    Labs: Recent Review Flowsheet Data     Labs for ITP Cardiac and Pulmonary Rehab Latest Ref Rng & Units 03/01/2017 06/05/2018 07/24/2018 05/27/2021 07/08/2021   Cholestrol 100 - 199 mg/dL 140 142 139 174 102   LDLCALC 0 - 99 mg/dL 76 83 80 109(H) 44   HDL >39 mg/dL 56 46 49 48 44   Trlycerides 0 - 149 mg/dL 42 63 51 87 62   Hemoglobin A1c 4.8 - 5.6 % - - - 5.7(H) -   TCO2 22 - 32 mmol/L - - - 19(L) -       Capillary Blood Glucose: No results found for: GLUCAP   Exercise Target Goals: Exercise Program Goal: Individual exercise prescription set using results from initial 6 min walk test and THRR while considering  patient's activity barriers  and safety.   Exercise Prescription Goal: Starting with aerobic activity 30 plus minutes a day, 3 days per week for initial exercise prescription. Provide home exercise prescription and guidelines that participant acknowledges understanding prior to discharge.  Activity Barriers & Risk Stratification:  Activity Barriers & Cardiac Risk Stratification - 08/23/21 1325       Activity Barriers & Cardiac Risk Stratification   Activity Barriers Arthritis;Other (comment);Balance Concerns    Comments Bilateral fallen arches, bilateral knee suregery: meniscus trimmed.    Cardiac Risk Stratification High             6 Minute Walk:  6 Minute Walk     Row Name 08/23/21 1347         6 Minute Walk   Phase  Initial     Distance 1722 feet     Walk Time 6 minutes     # of Rest Breaks 0     MPH 3.26     METS 4.34     RPE 12     Perceived Dyspnea  0     VO2 Peak 15.18     Symptoms No     Resting HR 79 bpm     Resting BP 124/70     Resting Oxygen Saturation  96 %     Exercise Oxygen Saturation  during 6 min walk 97 %     Max Ex. HR 103 bpm     Max Ex. BP 156/78     2 Minute Post BP 128/78              Oxygen Initial Assessment:   Oxygen Re-Evaluation:   Oxygen Discharge (Final Oxygen Re-Evaluation):   Initial Exercise Prescription:  Initial Exercise Prescription - 08/23/21 1500       Date of Initial Exercise RX and Referring Provider   Date 08/23/21    Referring Provider Troy Sine, MD    Expected Discharge Date 10/21/21      Bike   Level 2    Minutes 15    METs 3.2      NuStep   Level 3    SPM 85    Minutes 15    METs 3.2      Prescription Details   Frequency (times per week) 3    Duration Progress to 30 minutes of continuous aerobic without signs/symptoms of physical distress      Intensity   THRR 40-80% of Max Heartrate 66-132    Ratings of Perceived Exertion 11-13    Perceived Dyspnea 0-4      Progression   Progression Continue to progress workloads to maintain intensity without signs/symptoms of physical distress.      Resistance Training   Training Prescription Yes    Weight 4 lbs    Reps 10-15             Perform Capillary Blood Glucose checks as needed.  Exercise Prescription Changes:   Exercise Prescription Changes     Row Name 08/29/21 1512 09/19/21 1600           Response to Exercise   Blood Pressure (Admit) 126/84 120/60      Blood Pressure (Exercise) 172/88 150/80      Blood Pressure (Exit) 134/82 112/70      Heart Rate (Admit) 83 bpm 72 bpm      Heart Rate (Exercise) 106 bpm 126 bpm      Heart Rate (Exit) 80 bpm 82 bpm  Rating of Perceived Exertion (Exercise) 12 11      Symptoms None --      Comments Off to  a great start with exercise. Reviewed METs, goals and home Ex Rx      Duration Continue with 30 min of aerobic exercise without signs/symptoms of physical distress. Continue with 30 min of aerobic exercise without signs/symptoms of physical distress.      Intensity THRR unchanged THRR unchanged        Progression   Progression Continue to progress workloads to maintain intensity without signs/symptoms of physical distress. Continue to progress workloads to maintain intensity without signs/symptoms of physical distress.      Average METs 3.2 4.2        Resistance Training   Training Prescription Yes Yes      Weight 4 lbs 4 lbs      Reps 10-15 10-15      Time 10 Minutes 10 Minutes        Interval Training   Interval Training No --        Bike   Level 2 3.5      Minutes 15 15      METs 3.7 5        NuStep   Level 3 3      SPM 93 --      Minutes 15 15      METs 2.7 3.4        Home Exercise Plan   Plans to continue exercise at -- Home (comment)      Frequency -- Add 3 additional days to program exercise sessions.      Initial Home Exercises Provided -- 09/19/21               Exercise Comments:   Exercise Comments     Row Name 08/29/21 1604 09/19/21 1642         Exercise Comments Patient tolerated 1st session of exercise well without symptoms. Oriented to weights and stretches. Reviewed MET's goals and home Ex Rx. Pt is tolerating exercise well with an average of 4.2. Pt feels like he is going well and working towards his goals. Pt will continue to exercise 2-4 days on his own by walking 30-45 minutes per session. He expressed some concern with the temperatures dropping, but has visited [facility] and enjoyed the facility. Will refer to [facility] so that he can decide if he would like to join               Exercise Goals and Review:   Exercise Goals     Row Name 08/23/21 1313             Exercise Goals   Increase Physical Activity Yes       Intervention  Provide advice, education, support and counseling about physical activity/exercise needs.;Develop an individualized exercise prescription for aerobic and resistive training based on initial evaluation findings, risk stratification, comorbidities and participant's personal goals.       Expected Outcomes Short Term: Attend rehab on a regular basis to increase amount of physical activity.;Long Term: Exercising regularly at least 3-5 days a week.;Long Term: Add in home exercise to make exercise part of routine and to increase amount of physical activity.       Increase Strength and Stamina Yes       Intervention Provide advice, education, support and counseling about physical activity/exercise needs.;Develop an individualized exercise prescription for aerobic and resistive training based on initial evaluation findings,  risk stratification, comorbidities and participant's personal goals.       Expected Outcomes Short Term: Increase workloads from initial exercise prescription for resistance, speed, and METs.;Short Term: Perform resistance training exercises routinely during rehab and add in resistance training at home;Long Term: Improve cardiorespiratory fitness, muscular endurance and strength as measured by increased METs and functional capacity (6MWT)       Able to understand and use rate of perceived exertion (RPE) scale Yes       Intervention Provide education and explanation on how to use RPE scale       Expected Outcomes Short Term: Able to use RPE daily in rehab to express subjective intensity level;Long Term:  Able to use RPE to guide intensity level when exercising independently       Knowledge and understanding of Target Heart Rate Range (THRR) Yes       Intervention Provide education and explanation of THRR including how the numbers were predicted and where they are located for reference       Expected Outcomes Short Term: Able to state/look up THRR;Long Term: Able to use THRR to govern intensity  when exercising independently;Short Term: Able to use daily as guideline for intensity in rehab       Able to check pulse independently Yes       Intervention Provide education and demonstration on how to check pulse in carotid and radial arteries.;Review the importance of being able to check your own pulse for safety during independent exercise       Expected Outcomes Short Term: Able to explain why pulse checking is important during independent exercise;Long Term: Able to check pulse independently and accurately       Understanding of Exercise Prescription Yes       Intervention Provide education, explanation, and written materials on patient's individual exercise prescription       Expected Outcomes Short Term: Able to explain program exercise prescription;Long Term: Able to explain home exercise prescription to exercise independently                Exercise Goals Re-Evaluation :  Exercise Goals Re-Evaluation     [place] Name 08/29/21 1604 09/19/21 1637           Exercise Goal Re-Evaluation   Exercise Goals Review Increase Physical Activity;Able to understand and use rate of perceived exertion (RPE) scale Increase Physical Activity;Able to understand and use rate of perceived exertion (RPE) scale      Comments Patient able to understand and use RPE scale appropriately. Reviewed MET's goals and home Ex Rx. Pt is tolerating exercise well with an average of 4.2. Pt feels like he is going well and working towards his goals. Pt will continue to exercise 2-4 days on his own by walking 30-45 minutes per session. He expressed some concern with the temperatures dropping, but has visited [facility] and enjoyed the facility. Will refer to [place] so that he can decide if he would like to join      Expected Outcomes Progress workloads as tolerated to help improve cardiorespiratory fitness. Will continue to monitor pt and progress workloads as tolerated without sign or sympton. Pt will continue to exercise  on his own at home                Discharge Exercise Prescription (Final Exercise Prescription Changes):  Exercise Prescription Changes - 09/19/21 1600       Response to Exercise   Blood Pressure (Admit) 120/60    Blood Pressure (  Exercise) 150/80    Blood Pressure (Exit) 112/70    Heart Rate (Admit) 72 bpm    Heart Rate (Exercise) 126 bpm    Heart Rate (Exit) 82 bpm    Rating of Perceived Exertion (Exercise) 11    Comments Reviewed METs, goals and home Ex Rx    Duration Continue with 30 min of aerobic exercise without signs/symptoms of physical distress.    Intensity THRR unchanged      Progression   Progression Continue to progress workloads to maintain intensity without signs/symptoms of physical distress.    Average METs 4.2      Resistance Training   Training Prescription Yes    Weight 4 lbs    Reps 10-15    Time 10 Minutes      Bike   Level 3.5    Minutes 15    METs 5      NuStep   Level 3    Minutes 15    METs 3.4      Home Exercise Plan   Plans to continue exercise at Home (comment)    Frequency Add 3 additional days to program exercise sessions.    Initial Home Exercises Provided 09/19/21             Nutrition:  Target Goals: Understanding of nutrition guidelines, daily intake of sodium <153m, cholesterol <2067m calories 30% from fat and 7% or less from saturated fats, daily to have 5 or more servings of fruits and vegetables.  Biometrics:  Pre Biometrics - 08/23/21 1312       Pre Biometrics   Waist Circumference 42 inches    Hip Circumference 42.25 inches    Waist to Hip Ratio 0.99 %    Triceps Skinfold 9 mm    % Body Fat 28.2 %    Grip Strength 42 kg    Flexibility 0 in    Single Leg Stand 3.37 seconds              Nutrition Therapy Plan and Nutrition Goals:   Nutrition Assessments:  MEDIFICTS Score Key: ?70 Need to make dietary changes  40-70 Heart Healthy Diet ? 40 Therapeutic Level Cholesterol Diet   Picture  Your Plate Scores: <4<58nhealthy dietary pattern with much room for improvement. 41-50 Dietary pattern unlikely to meet recommendations for good health and room for improvement. 51-60 More healthful dietary pattern, with some room for improvement.  >60 Healthy dietary pattern, although there may be some specific behaviors that could be improved.    Nutrition Goals Re-Evaluation:   Nutrition Goals Discharge (Final Nutrition Goals Re-Evaluation):   Psychosocial: Target Goals: Acknowledge presence or absence of significant depression and/or stress, maximize coping skills, provide positive support system. Participant is able to verbalize types and ability to use techniques and skills needed for reducing stress and depression.  Initial Review & Psychosocial Screening:  Initial Psych Review & Screening - 08/23/21 1339       Initial Review   Current issues with Current Stress Concerns    Source of Stress Concerns Occupation    Comments MiRonalee Beltsays that he has some job stress as he works on coMarriottMiRonalee Beltsays that things are slow at his job since the [place] pandemic.      Family Dynamics   Good Support System? Yes   MiRonalee Beltsas his wife and two children for support     Barriers   Psychosocial barriers to participate in program The patient should benefit from training in  stress management and relaxation.      Screening Interventions   Interventions Encouraged to exercise    Expected Outcomes Long Term Goal: Stressors or current issues are controlled or eliminated.;Short Term goal: Identification and review with participant of any Quality of Life or Depression concerns found by scoring the questionnaire.             Quality of Life Scores:  Quality of Life - 08/23/21 1531       Quality of Life   Select Quality of Life      Quality of Life Scores   Health/Function Pre 24 %    Socioeconomic Pre 27 %    Psych/Spiritual Pre 25.5 %    Family Pre 25.2 %    GLOBAL Pre 25.16 %             Scores of 19 and below usually indicate a poorer quality of life in these areas.  A difference of  2-3 points is a clinically meaningful difference.  A difference of 2-3 points in the total score of the Quality of Life Index has been associated with significant improvement in overall quality of life, self-image, physical symptoms, and general health in studies assessing change in quality of life.  PHQ-9: Recent Review Flowsheet Data     Depression screen [facility] 2/9 08/23/2021   Decreased Interest 0   Down, Depressed, Hopeless 0   PHQ - 2 Score 0      Interpretation of Total Score  Total Score Depression Severity:  1-4 = Minimal depression, 5-9 = Mild depression, 10-14 = Moderate depression, 15-19 = Moderately severe depression, 20-27 = Severe depression   Psychosocial Evaluation and Intervention:   Psychosocial Re-Evaluation:  Psychosocial Re-Evaluation     [place] Name 09/28/21 1547             Psychosocial Re-Evaluation   Current issues with Current Stress Concerns       Comments Ronalee Belts says his job has not been as stressful since he has returned to work and is back into the swing of things.       Interventions Stress management education;Relaxation education;Encouraged to attend Cardiac Rehabilitation for the exercise       Continue Psychosocial Services  No Follow up required         Initial Review   Source of Stress Concerns Occupation       Comments Will continue to monitor and offer support as needed.                Psychosocial Discharge (Final Psychosocial Re-Evaluation):  Psychosocial Re-Evaluation - 09/28/21 1547       Psychosocial Re-Evaluation   Current issues with Current Stress Concerns    Comments Ronalee Belts says his job has not been as stressful since he has returned to work and is back into the swing of things.    Interventions Stress management education;Relaxation education;Encouraged to attend Cardiac Rehabilitation for the exercise    Continue  Psychosocial Services  No Follow up required      Initial Review   Source of Stress Concerns Occupation    Comments Will continue to monitor and offer support as needed.             Vocational Rehabilitation: Provide vocational rehab assistance to qualifying candidates.   Vocational Rehab Evaluation & Intervention:  Vocational Rehab - 08/23/21 1344       Initial Vocational Rehab Evaluation & Intervention   Assessment shows need for Vocational Rehabilitation No  Ronalee Belts works full time and does not need vocational rehab            Education: Education Goals: Education classes will be provided on a weekly basis, covering required topics. Participant will state understanding/return demonstration of topics presented.  Learning Barriers/Preferences:  Learning Barriers/Preferences - 08/23/21 1613       Learning Barriers/Preferences   Learning Barriers Sight   wears glasses   Learning Preferences Skilled Demonstration;Video;Pictoral             Education Topics: Hypertension, Hypertension Reduction -Define heart disease and high blood pressure. Discus how high blood pressure affects the body and ways to reduce high blood pressure.   Exercise and Your Heart -Discuss why it is important to exercise, the FITT principles of exercise, normal and abnormal responses to exercise, and how to exercise safely.   Angina -Discuss definition of angina, causes of angina, treatment of angina, and how to decrease risk of having angina.   Cardiac Medications -Review what the following cardiac medications are used for, how they affect the body, and side effects that may occur when taking the medications.  Medications include Aspirin, Beta blockers, calcium channel blockers, ACE Inhibitors, angiotensin receptor blockers, diuretics, digoxin, and antihyperlipidemics.   Congestive Heart Failure -Discuss the definition of CHF, how to live with CHF, the signs and symptoms of CHF, and how  keep track of weight and sodium intake.   Heart Disease and Intimacy -Discus the effect sexual activity has on the heart, how changes occur during intimacy as we age, and safety during sexual activity.   Smoking Cessation / COPD -Discuss different methods to quit smoking, the health benefits of quitting smoking, and the definition of COPD.   Nutrition I: Fats -Discuss the types of cholesterol, what cholesterol does to the heart, and how cholesterol levels can be controlled.   Nutrition II: Labels -Discuss the different components of food labels and how to read food label   Heart Parts/Heart Disease and PAD -Discuss the anatomy of the heart, the pathway of blood circulation through the heart, and these are affected by heart disease.   Stress I: Signs and Symptoms -Discuss the causes of stress, how stress may lead to anxiety and depression, and ways to limit stress.   Stress II: Relaxation -Discuss different types of relaxation techniques to limit stress.   Warning Signs of Stroke / TIA -Discuss definition of a stroke, what the signs and symptoms are of a stroke, and how to identify when someone is having stroke.   Knowledge Questionnaire Score:  Knowledge Questionnaire Score - 08/23/21 1516       Knowledge Questionnaire Score   Pre Score 22/24             Core Components/Risk Factors/Patient Goals at Admission:  Personal Goals and Risk Factors at Admission - 08/23/21 1325       Core Components/Risk Factors/Patient Goals on Admission    Weight Management Obesity;Weight Loss;Yes    Intervention Weight Management/Obesity: Establish reasonable short term and long term weight goals.;Obesity: Provide education and appropriate resources to help participant work on and attain dietary goals.    Admit Weight 216 lb 7.9 oz (98.2 kg)    Expected Outcomes Short Term: Continue to assess and modify interventions until short term weight is achieved;Long Term: Adherence to  nutrition and physical activity/exercise program aimed toward attainment of established weight goal;Weight Loss: Understanding of general recommendations for a balanced deficit meal plan, which promotes 1-2 lb weight loss per week  and includes a negative energy balance of [phone] kcal/d    Hypertension Yes    Intervention Provide education on lifestyle modifcations including regular physical activity/exercise, weight management, moderate sodium restriction and increased consumption of fresh fruit, vegetables, and low fat dairy, alcohol moderation, and smoking cessation.;Monitor prescription use compliance.    Expected Outcomes Short Term: Continued assessment and intervention until BP is < 140/60m HG in hypertensive participants. < 130/83mHG in hypertensive participants with diabetes, heart failure or chronic kidney disease.;Long Term: Maintenance of blood pressure at goal levels.    Lipids Yes    Intervention Provide education and support for participant on nutrition & aerobic/resistive exercise along with prescribed medications to achieve LDL <703mHDL >45m46m  Expected Outcomes Short Term: Participant states understanding of desired cholesterol values and is compliant with medications prescribed. Participant is following exercise prescription and nutrition guidelines.;Long Term: Cholesterol controlled with medications as prescribed, with individualized exercise RX and with personalized nutrition plan. Value goals: LDL < 70mg71mL > 40 mg.    Personal Goal Other Yes    Personal Goal Reduce anxiety.    Intervention Offer individual education/counseling on adjustment to heart disease, stress management and health related lifestyle change.    Expected Outcomes Participant demonstrates changes in health-related behavior, relaxation and other stress management skills.             Core Components/Risk Factors/Patient Goals Review:   Goals and Risk Factor Review     Row Name 08/29/21 1714  09/28/21 1551           Core Components/Risk Factors/Patient Goals Review   Personal Goals Review Weight Management/Obesity;Stress;Hypertension;Lipids Weight Management/Obesity;Stress;Hypertension;Lipids      Review [name] Ronalee Beltsted CR on 08/29/21 and did well with exercise. Moderate exertional BP elevations noted today will continue to monitor BP [name] Ronalee Beltsbeen doing well with exercise at phase 2 cardiac rehab. [name]'s continues to her exertional BP elevations. Will forward to Dr KellyClaiborne Billingsreview.      Expected Outcomes [name] Ronalee Belts continue to participate in phase 2 cardiac rehab for exercise, nutrtitiona andlifestyle modifications [name] Ronalee Belts continue to participate in phase 2 cardiac rehab for exercise, nutrtitiona andlifestyle modifications               Core Components/Risk Factors/Patient Goals at Discharge (Final Review):   Goals and Risk Factor Review - 09/28/21 1551       Core Components/Risk Factors/Patient Goals Review   Personal Goals Review Weight Management/Obesity;Stress;Hypertension;Lipids    Review [name] Ronalee Beltsbeen doing well with exercise at phase 2 cardiac rehab. [name]'s continues to her exertional BP elevations. Will forward to Dr KellyClaiborne Billingsreview.    Expected Outcomes [name] Ronalee Belts continue to participate in phase 2 cardiac rehab for exercise, nutrtitiona andlifestyle modifications             ITP Comments:  ITP Comments     Row Name 08/23/21 1312 08/29/21 1712 09/28/21 1537       ITP Comments Medical Director- Dr. TraciFransico Him30 Day ITP Review. [name] Ronalee Beltsted cardiac rehab on 08/29/21 and did well with exercise. 30 Day ITP Review. [name] Ronalee Beltsgood attendance and participation in phase 2 cardiac rehab              Comments: See ITP comments.MariaBarnet PallBSN 09/28/2021 3:55 PM

## 2021-09-30 ENCOUNTER — Other Ambulatory Visit: Payer: Self-pay

## 2021-09-30 ENCOUNTER — Encounter (HOSPITAL_COMMUNITY)
Admission: RE | Admit: 2021-09-30 | Discharge: 2021-09-30 | Disposition: A | Discharge status: Home / Self Care | Payer: BC Managed Care – PPO | Source: Ambulatory Visit | Attending: Cardiovascular Disease | Admitting: Cardiovascular Disease

## 2021-09-30 ENCOUNTER — Encounter (HOSPITAL_COMMUNITY): Payer: BC Managed Care – PPO

## 2021-09-30 ENCOUNTER — Telehealth: Payer: Self-pay | Admitting: Cardiovascular Disease

## 2021-09-30 DIAGNOSIS — Z955 Presence of coronary angioplasty implant and graft: Secondary | ICD-10-CM | POA: Insufficient documentation

## 2021-09-30 DIAGNOSIS — I10 Essential (primary) hypertension: Secondary | ICD-10-CM | POA: Diagnosis not present

## 2021-09-30 DIAGNOSIS — I213 ST elevation (STEMI) myocardial infarction of unspecified site: Secondary | ICD-10-CM | POA: Diagnosis not present

## 2021-09-30 DIAGNOSIS — G4733 Obstructive sleep apnea (adult) (pediatric): Secondary | ICD-10-CM | POA: Diagnosis not present

## 2021-09-30 DIAGNOSIS — M8949 Other hypertrophic osteoarthropathy, multiple sites: Secondary | ICD-10-CM | POA: Diagnosis not present

## 2021-09-30 DIAGNOSIS — H00015 Hordeolum externum left lower eyelid: Secondary | ICD-10-CM | POA: Diagnosis not present

## 2021-09-30 NOTE — Telephone Encounter (Signed)
Received a call from [facility] with Cardiac rehab she wanted to make sure Dr.Kelly got B/P readings she faxed.Stated patient's B/P has been elevated with exercise.Stated after exercise B/P normal.We received B/P readings,given to [employer] for [facility] to review.

## 2021-09-30 NOTE — Telephone Encounter (Signed)
Maria from Cardiac Rehab calling to speak with triage about the patient's exertional BP.

## 2021-10-03 ENCOUNTER — Encounter (HOSPITAL_COMMUNITY)
Admission: RE | Admit: 2021-10-03 | Discharge: 2021-10-03 | Disposition: A | Discharge status: Home / Self Care | Payer: BC Managed Care – PPO | Source: Ambulatory Visit

## 2021-10-03 ENCOUNTER — Encounter (HOSPITAL_COMMUNITY): Payer: BC Managed Care – PPO

## 2021-10-03 DIAGNOSIS — Z955 Presence of coronary angioplasty implant and graft: Secondary | ICD-10-CM

## 2021-10-03 DIAGNOSIS — I213 ST elevation (STEMI) myocardial infarction of unspecified site: Secondary | ICD-10-CM

## 2021-10-04 ENCOUNTER — Other Ambulatory Visit: Payer: Self-pay

## 2021-10-05 ENCOUNTER — Encounter (HOSPITAL_COMMUNITY): Payer: BC Managed Care – PPO

## 2021-10-05 ENCOUNTER — Encounter (HOSPITAL_COMMUNITY)
Admission: RE | Admit: 2021-10-05 | Discharge: 2021-10-05 | Disposition: A | Discharge status: Home / Self Care | Payer: BC Managed Care – PPO | Source: Ambulatory Visit | Attending: Cardiovascular Disease | Admitting: Cardiovascular Disease

## 2021-10-05 DIAGNOSIS — Z955 Presence of coronary angioplasty implant and graft: Secondary | ICD-10-CM | POA: Diagnosis not present

## 2021-10-05 DIAGNOSIS — I213 ST elevation (STEMI) myocardial infarction of unspecified site: Secondary | ICD-10-CM | POA: Diagnosis not present

## 2021-10-07 ENCOUNTER — Encounter (HOSPITAL_COMMUNITY)
Admission: RE | Admit: 2021-10-07 | Discharge: 2021-10-07 | Disposition: A | Discharge status: Home / Self Care | Payer: BC Managed Care – PPO | Source: Ambulatory Visit | Attending: Cardiovascular Disease | Admitting: Cardiovascular Disease

## 2021-10-07 ENCOUNTER — Other Ambulatory Visit: Payer: Self-pay

## 2021-10-07 ENCOUNTER — Encounter (HOSPITAL_COMMUNITY): Payer: BC Managed Care – PPO

## 2021-10-07 DIAGNOSIS — I213 ST elevation (STEMI) myocardial infarction of unspecified site: Secondary | ICD-10-CM | POA: Diagnosis not present

## 2021-10-07 DIAGNOSIS — Z955 Presence of coronary angioplasty implant and graft: Secondary | ICD-10-CM | POA: Diagnosis not present

## 2021-10-10 ENCOUNTER — Encounter (HOSPITAL_COMMUNITY)
Admission: RE | Admit: 2021-10-10 | Discharge: 2021-10-10 | Disposition: A | Discharge status: Home / Self Care | Payer: BC Managed Care – PPO | Source: Ambulatory Visit | Attending: Cardiovascular Disease | Admitting: Cardiovascular Disease

## 2021-10-10 ENCOUNTER — Other Ambulatory Visit: Payer: Self-pay

## 2021-10-10 ENCOUNTER — Encounter (HOSPITAL_COMMUNITY): Payer: BC Managed Care – PPO

## 2021-10-10 DIAGNOSIS — Z955 Presence of coronary angioplasty implant and graft: Secondary | ICD-10-CM

## 2021-10-10 DIAGNOSIS — I213 ST elevation (STEMI) myocardial infarction of unspecified site: Secondary | ICD-10-CM

## 2021-10-12 ENCOUNTER — Encounter (HOSPITAL_COMMUNITY): Payer: BC Managed Care – PPO

## 2021-10-12 ENCOUNTER — Encounter (HOSPITAL_COMMUNITY)
Admission: RE | Admit: 2021-10-12 | Discharge: 2021-10-12 | Disposition: A | Discharge status: Home / Self Care | Payer: BC Managed Care – PPO | Source: Ambulatory Visit | Attending: Cardiovascular Disease | Admitting: Cardiovascular Disease

## 2021-10-12 ENCOUNTER — Other Ambulatory Visit: Payer: Self-pay

## 2021-10-12 DIAGNOSIS — I213 ST elevation (STEMI) myocardial infarction of unspecified site: Secondary | ICD-10-CM | POA: Diagnosis not present

## 2021-10-12 DIAGNOSIS — Z955 Presence of coronary angioplasty implant and graft: Secondary | ICD-10-CM | POA: Diagnosis not present

## 2021-10-14 ENCOUNTER — Encounter (HOSPITAL_COMMUNITY)
Admission: RE | Admit: 2021-10-14 | Discharge: 2021-10-14 | Disposition: A | Discharge status: Home / Self Care | Payer: BC Managed Care – PPO | Source: Ambulatory Visit | Attending: Cardiovascular Disease | Admitting: Cardiovascular Disease

## 2021-10-14 ENCOUNTER — Encounter (HOSPITAL_COMMUNITY): Payer: BC Managed Care – PPO

## 2021-10-14 ENCOUNTER — Other Ambulatory Visit: Payer: Self-pay

## 2021-10-14 VITALS — Ht 67.5 in | Wt 214.1 lb

## 2021-10-14 DIAGNOSIS — Z955 Presence of coronary angioplasty implant and graft: Secondary | ICD-10-CM

## 2021-10-14 DIAGNOSIS — I213 ST elevation (STEMI) myocardial infarction of unspecified site: Secondary | ICD-10-CM

## 2021-10-17 ENCOUNTER — Encounter (HOSPITAL_COMMUNITY)
Admission: RE | Admit: 2021-10-17 | Discharge: 2021-10-17 | Disposition: A | Discharge status: Home / Self Care | Payer: BC Managed Care – PPO | Source: Ambulatory Visit | Attending: Cardiovascular Disease | Admitting: Cardiovascular Disease

## 2021-10-17 ENCOUNTER — Encounter (HOSPITAL_COMMUNITY): Payer: BC Managed Care – PPO

## 2021-10-17 ENCOUNTER — Other Ambulatory Visit: Payer: Self-pay

## 2021-10-17 DIAGNOSIS — Z955 Presence of coronary angioplasty implant and graft: Secondary | ICD-10-CM | POA: Diagnosis not present

## 2021-10-17 DIAGNOSIS — I213 ST elevation (STEMI) myocardial infarction of unspecified site: Secondary | ICD-10-CM

## 2021-10-17 NOTE — Progress Notes (Signed)
Brandon Villanueva reported that he fell down some steps at his home while changing out a door knob to his basement. A healing scab  was noted on [name]'s right elbow. Brandon Villanueva said that his hip bothered him some during the weekend. Brandon Villanueva denies any complaints today during exercise at cardiac rehab.Will continue to monitor the patient throughout  the program.[name] Harlon Flor, RN,BSN 10/17/2021 3:48 PM

## 2021-10-19 ENCOUNTER — Other Ambulatory Visit: Payer: Self-pay

## 2021-10-19 ENCOUNTER — Encounter (HOSPITAL_COMMUNITY): Payer: BC Managed Care – PPO

## 2021-10-19 ENCOUNTER — Telehealth: Payer: Self-pay | Admitting: Podiatry

## 2021-10-19 ENCOUNTER — Encounter (HOSPITAL_COMMUNITY)
Admission: RE | Admit: 2021-10-19 | Discharge: 2021-10-19 | Disposition: A | Discharge status: Home / Self Care | Payer: BC Managed Care – PPO | Source: Ambulatory Visit | Attending: Cardiovascular Disease | Admitting: Cardiovascular Disease

## 2021-10-19 DIAGNOSIS — G4733 Obstructive sleep apnea (adult) (pediatric): Secondary | ICD-10-CM | POA: Diagnosis not present

## 2021-10-19 DIAGNOSIS — Z955 Presence of coronary angioplasty implant and graft: Secondary | ICD-10-CM

## 2021-10-19 DIAGNOSIS — I213 ST elevation (STEMI) myocardial infarction of unspecified site: Secondary | ICD-10-CM

## 2021-10-19 NOTE — Telephone Encounter (Signed)
Refurbished orthotics in.. pt aware ok to pick up. 

## 2021-10-19 NOTE — Progress Notes (Signed)
Discharge Progress Report  Patient Details  Name: Brandon Villanueva. MRN: [id number] Date of Birth: September 03, 1966 Referring Provider:   Flowsheet Row CARDIAC REHAB PHASE II ORIENTATION from 08/23/2021 in [place]  Referring Provider Troy Sine, MD        Number of Visits: 19  Reason for Discharge:  Patient reached a stable level of exercise. Patient independent in their exercise.  Smoking History:  Social History   Tobacco Use  Smoking Status Never  Smokeless Tobacco Never    Diagnosis:  05/27/21 STEMI  05/27/21 DES LAD  ADL UCSD:   Initial Exercise Prescription:  Initial Exercise Prescription - 08/23/21 1500       Date of Initial Exercise RX and Referring Provider   Date 08/23/21    Referring Provider Troy Sine, MD    Expected Discharge Date 10/21/21      Bike   Level 2    Minutes 15    METs 3.2      NuStep   Level 3    SPM 85    Minutes 15    METs 3.2      Prescription Details   Frequency (times per week) 3    Duration Progress to 30 minutes of continuous aerobic without signs/symptoms of physical distress      Intensity   THRR 40-80% of Max Heartrate 66-132    Ratings of Perceived Exertion 11-13    Perceived Dyspnea 0-4      Progression   Progression Continue to progress workloads to maintain intensity without signs/symptoms of physical distress.      Resistance Training   Training Prescription Yes    Weight 4 lbs    Reps 10-15             Discharge Exercise Prescription (Final Exercise Prescription Changes):  Exercise Prescription Changes - 10/21/21 1619       Response to Exercise   Blood Pressure (Admit) 138/82    Blood Pressure (Exercise) 175/80    Blood Pressure (Exit) 110/60    Heart Rate (Admit) 79 bpm    Heart Rate (Exercise) 127 bpm    Heart Rate (Exit) 81 bpm    Rating of Perceived Exertion (Exercise) 11    Comments Pt graduated the CRP2    Duration Continue with 30 min of  aerobic exercise without signs/symptoms of physical distress.    Intensity THRR unchanged      Progression   Progression Continue to progress workloads to maintain intensity without signs/symptoms of physical distress.    Average METs 4.85      Resistance Training   Training Prescription Yes    Weight 6 lbs    Reps 10-15    Time 10 Minutes      Bike   Level 3.5    Minutes 15    METs 5.3      NuStep   Level 5    Minutes 15    METs 4.4      Home Exercise Plan   Plans to continue exercise at Home (comment)    Frequency Add 3 additional days to program exercise sessions.    Initial Home Exercises Provided 09/19/21             Functional Capacity:  6 Minute Walk     Row Name 08/23/21 1347 10/14/21 1631       6 Minute Walk   Phase Initial Discharge    Distance 1722 feet 1916 feet  Distance % Change -- 11.27 %    Distance Feet Change -- 194 ft    Walk Time 6 minutes 6 minutes    # of Rest Breaks 0 0    MPH 3.26 3.63    METS 4.34 4.5    RPE 12 10    Perceived Dyspnea  0 0    VO2 Peak 15.18 15.76    Symptoms No No    Resting HR 79 bpm 76 bpm    Resting BP 124/70 108/70    Resting Oxygen Saturation  96 % 96 %    Exercise Oxygen Saturation  during 6 min walk 97 % 96 %    Max Ex. HR 103 bpm 107 bpm    Max Ex. BP 156/78 126/62    2 Minute Post BP 128/78 120/68             Psychological, QOL, Others - Outcomes: PHQ 2/9: Depression screen [facility] 2/9 10/19/2021 08/23/2021  Decreased Interest 0 0  Down, Depressed, Hopeless 0 0  PHQ - 2 Score 0 0    Quality of Life:  Quality of Life - 10/14/21 1649       Quality of Life   Select Quality of Life      Quality of Life Scores   Health/Function Post 29.2 %    Socioeconomic Post 29.25 %    Psych/Spiritual Post 29.14 %    Family Post 27.6 %    GLOBAL Post 28.97 %             Personal Goals: Goals established at orientation with interventions provided to work toward goal.  Personal Goals and Risk  Factors at Admission - 08/23/21 1325       Core Components/Risk Factors/Patient Goals on Admission    Weight Management Obesity;Weight Loss;Yes    Intervention Weight Management/Obesity: Establish reasonable short term and long term weight goals.;Obesity: Provide education and appropriate resources to help participant work on and attain dietary goals.    Admit Weight 216 lb 7.9 oz (98.2 kg)    Expected Outcomes Short Term: Continue to assess and modify interventions until short term weight is achieved;Long Term: Adherence to nutrition and physical activity/exercise program aimed toward attainment of established weight goal;Weight Loss: Understanding of general recommendations for a balanced deficit meal plan, which promotes 1-2 lb weight loss per week and includes a negative energy balance of [phone] kcal/d    Hypertension Yes    Intervention Provide education on lifestyle modifcations including regular physical activity/exercise, weight management, moderate sodium restriction and increased consumption of fresh fruit, vegetables, and low fat dairy, alcohol moderation, and smoking cessation.;Monitor prescription use compliance.    Expected Outcomes Short Term: Continued assessment and intervention until BP is < 140/51m HG in hypertensive participants. < 130/835mHG in hypertensive participants with diabetes, heart failure or chronic kidney disease.;Long Term: Maintenance of blood pressure at goal levels.    Lipids Yes    Intervention Provide education and support for participant on nutrition & aerobic/resistive exercise along with prescribed medications to achieve LDL <7062mHDL >2m35m  Expected Outcomes Short Term: Participant states understanding of desired cholesterol values and is compliant with medications prescribed. Participant is following exercise prescription and nutrition guidelines.;Long Term: Cholesterol controlled with medications as prescribed, with individualized exercise RX and with  personalized nutrition plan. Value goals: LDL < 70mg15mL > 40 mg.    Personal Goal Other Yes    Personal Goal Reduce anxiety.    Intervention Offer  individual education/counseling on adjustment to heart disease, stress management and health related lifestyle change.    Expected Outcomes Participant demonstrates changes in health-related behavior, relaxation and other stress management skills.              Personal Goals Discharge:  Goals and Risk Factor Review     Row Name 08/29/21 1714 09/28/21 1551 10/19/21 1648         Core Components/Risk Factors/Patient Goals Review   Personal Goals Review Weight Management/Obesity;Stress;Hypertension;Lipids Weight Management/Obesity;Stress;Hypertension;Lipids Weight Management/Obesity;Stress;Hypertension;Lipids     Review Ronalee Belts started CR on 08/29/21 and did well with exercise. Moderate exertional BP elevations noted today will continue to monitor BP Ronalee Belts has been doing well with exercise at phase 2 cardiac rehab. [name]'s continues to her exertional BP elevations. Will forward to Dr Claiborne Billings for review. Ronalee Belts has been doing well with exercise at phase 2 cardiac rehab. Ronalee Belts will complete cardiac rehab on 10/21/21     Expected Outcomes Ronalee Belts will continue to participate in phase 2 cardiac rehab for exercise, nutrtitiona andlifestyle modifications Ronalee Belts will continue to participate in phase 2 cardiac rehab for exercise, nutrtitiona andlifestyle modifications Ronalee Belts will continue to  exercise,follow  nutrtitiona and lifestyle modifications upon completion of phase 2 cardiac rehab.              Exercise Goals and Review:  Exercise Goals     Row Name 08/23/21 1313             Exercise Goals   Increase Physical Activity Yes       Intervention Provide advice, education, support and counseling about physical activity/exercise needs.;Develop an individualized exercise prescription for aerobic and resistive training based on initial evaluation findings,  risk stratification, comorbidities and participant's personal goals.       Expected Outcomes Short Term: Attend rehab on a regular basis to increase amount of physical activity.;Long Term: Exercising regularly at least 3-5 days a week.;Long Term: Add in home exercise to make exercise part of routine and to increase amount of physical activity.       Increase Strength and Stamina Yes       Intervention Provide advice, education, support and counseling about physical activity/exercise needs.;Develop an individualized exercise prescription for aerobic and resistive training based on initial evaluation findings, risk stratification, comorbidities and participant's personal goals.       Expected Outcomes Short Term: Increase workloads from initial exercise prescription for resistance, speed, and METs.;Short Term: Perform resistance training exercises routinely during rehab and add in resistance training at home;Long Term: Improve cardiorespiratory fitness, muscular endurance and strength as measured by increased METs and functional capacity (6MWT)       Able to understand and use rate of perceived exertion (RPE) scale Yes       Intervention Provide education and explanation on how to use RPE scale       Expected Outcomes Short Term: Able to use RPE daily in rehab to express subjective intensity level;Long Term:  Able to use RPE to guide intensity level when exercising independently       Knowledge and understanding of Target Heart Rate Range (THRR) Yes       Intervention Provide education and explanation of THRR including how the numbers were predicted and where they are located for reference       Expected Outcomes Short Term: Able to state/look up THRR;Long Term: Able to use THRR to govern intensity when exercising independently;Short Term: Able to use daily as guideline for intensity in  rehab       Able to check pulse independently Yes       Intervention Provide education and demonstration on how to check  pulse in carotid and radial arteries.;Review the importance of being able to check your own pulse for safety during independent exercise       Expected Outcomes Short Term: Able to explain why pulse checking is important during independent exercise;Long Term: Able to check pulse independently and accurately       Understanding of Exercise Prescription Yes       Intervention Provide education, explanation, and written materials on patient's individual exercise prescription       Expected Outcomes Short Term: Able to explain program exercise prescription;Long Term: Able to explain home exercise prescription to exercise independently                Exercise Goals Re-Evaluation:  Exercise Goals Re-Evaluation     [place] Name 08/29/21 1604 09/19/21 1637 10/03/21 1615 10/21/21 1626       Exercise Goal Re-Evaluation   Exercise Goals Review Increase Physical Activity;Able to understand and use rate of perceived exertion (RPE) scale Increase Physical Activity;Able to understand and use rate of perceived exertion (RPE) scale Increase Physical Activity;Increase Strength and Stamina;Able to understand and use rate of perceived exertion (RPE) scale;Knowledge and understanding of Target Heart Rate Range (THRR);Understanding of Exercise Prescription Increase Physical Activity;Increase Strength and Stamina;Able to understand and use rate of perceived exertion (RPE) scale;Knowledge and understanding of Target Heart Rate Range (THRR);Understanding of Exercise Prescription    Comments Patient able to understand and use RPE scale appropriately. Reviewed MET's goals and home Ex Rx. Pt is tolerating exercise well with an average of 4.2. Pt feels like he is going well and working towards his goals. Pt will continue to exercise 2-4 days on his own by walking 30-45 minutes per session. He expressed some concern with the temperatures dropping, but has visited [facility] and enjoyed the facility. Will refer to [place] so that  he can decide if he would like to join Reviewed MET's and goals. Pt is tolerating exercise well with an average MET level of 4.3. Pt feels like he is working towards his goal of having better control of his anxiety. He also wanted to add in a goal of gaining flexibilty, gave and reviewed a packet with stretches to do on his own at home Pt graduated the [place] program today. Pt tolerated exercise well with an average MET level of 4.85. Will continue to exercise with his wife at [facility] 5 days a week for 30-45 minutes per session    Expected Outcomes Progress workloads as tolerated to help improve cardiorespiratory fitness. Will continue to monitor pt and progress workloads as tolerated without sign or sympton. Pt will continue to exercise on his own at home Will continue to monitor pt and progress workloads as tolerated without sign or sympton. Pt will continue to exercise on his own             Nutrition & Weight - Outcomes:  Pre Biometrics - 08/23/21 1312       Pre Biometrics   Waist Circumference 42 inches    Hip Circumference 42.25 inches    Waist to Hip Ratio 0.99 %    Triceps Skinfold 9 mm    % Body Fat 28.2 %    Grip Strength 42 kg    Flexibility 0 in    Single Leg Stand 3.37 seconds  Post Biometrics - 10/14/21 1641        Post  Biometrics   Height 5' 7.5" (1.715 m)    Weight 97.1 kg    Waist Circumference 42.5 inches    Hip Circumference 41.5 inches    Waist to Hip Ratio 1.02 %    BMI (Calculated) 33.01    Triceps Skinfold 5 mm    % Body Fat 26 %    Grip Strength 45 kg    Flexibility 11.25 in    Single Leg Stand 21 seconds             Nutrition:   Nutrition Discharge:   Education Questionnaire Score:  Knowledge Questionnaire Score - 10/14/21 1650       Knowledge Questionnaire Score   Post Score 24/24             Goals reviewed with patient; copy given to patient.Pt graduated from cardiac rehab program on 10/21/21  with completion  of 19 exercise sessions in Phase II. Pt maintained good attendance and progressed nicely during his participation in rehab as evidenced by increased MET level.   Medication list reconciled. Repeat  PHQ score-  0.  Pt has made significant lifestyle changes and should be commended for his success. Pt feels he has achieved his goals during cardiac rehab.   Pt plans to continue exercise at the [facility]. We are proud of [name]'s progress.Ronalee Belts increased his  distance on his post exercise walk test by 194 feet.Barnet Pall, RN,BSN 10/27/2021 2:56 PM

## 2021-10-20 ENCOUNTER — Ambulatory Visit (INDEPENDENT_AMBULATORY_CARE_PROVIDER_SITE_OTHER): Payer: BC Managed Care – PPO | Admitting: Cardiovascular Disease

## 2021-10-20 ENCOUNTER — Encounter: Payer: Self-pay | Admitting: Cardiovascular Disease

## 2021-10-20 DIAGNOSIS — G4733 Obstructive sleep apnea (adult) (pediatric): Secondary | ICD-10-CM | POA: Diagnosis not present

## 2021-10-20 DIAGNOSIS — I2510 Atherosclerotic heart disease of native coronary artery without angina pectoris: Secondary | ICD-10-CM | POA: Diagnosis not present

## 2021-10-20 DIAGNOSIS — E669 Obesity, unspecified: Secondary | ICD-10-CM

## 2021-10-20 DIAGNOSIS — I2102 ST elevation (STEMI) myocardial infarction involving left anterior descending coronary artery: Secondary | ICD-10-CM | POA: Diagnosis not present

## 2021-10-20 DIAGNOSIS — I255 Ischemic cardiomyopathy: Secondary | ICD-10-CM | POA: Diagnosis not present

## 2021-10-20 DIAGNOSIS — E785 Hyperlipidemia, unspecified: Secondary | ICD-10-CM

## 2021-10-20 NOTE — Patient Instructions (Signed)
Medication Instructions:  Your physician recommends that you continue on your current medications as directed. Please refer to the Current Medication list given to you today.  *If you need a refill on your cardiac medications before your next appointment, please call your pharmacy*   Follow-Up: At [facility], you and your health needs are our priority.  As part of our continuing mission to provide you with exceptional heart care, we have created designated Provider Care Teams.  These Care Teams include your primary Cardiologist (physician) and Advanced Practice Providers (APPs -  Physician Assistants and Nurse Practitioners) who all work together to provide you with the care you need, when you need it.  We recommend signing up for the patient portal called "MyChart".  Sign up information is provided on this After Visit Summary.  MyChart is used to connect with patients for Virtual Visits (Telemedicine).  Patients are able to view lab/test results, encounter notes, upcoming appointments, etc.  Non-urgent messages can be sent to your provider as well.   To learn more about what you can do with MyChart, go to [email or web address].    Your next appointment:   6 month(s)  The format for your next appointment:   In Person  Provider:   Thomas Kelly, MD  

## 2021-10-20 NOTE — Progress Notes (Signed)
Cardiology Office Note    Date:  10/22/2021   ID:  Sigmund Hazel., DOB 1966/09/14, MRN [id number]  PCP:  [name], MD  Cardiologist:  [name], MD   Initial office visit with me following his STEMI  History of Present Illness:  Bassel Gaskill. is a 55 y.o. male who has a history of hypertension, obstructive sleep apnea on CPAP therapy since December 2015, and history of CAD.  He had undergone a remote evaluation for chest pain in the past.  On May 27, 2021 while cutting the grass he developed substernal chest tightness and significant shortness of breath.  Upon presenting to the emergency room he had marked ST depression inferiorly with ST segment elevation anteriorly.  He was taken emergently to the catheterization laboratory where I performed catheterization.  He was found to have total occlusion of the LAD at its ostium with TIMI 0 flow and no evidence for collaterals.  There was mild narrowing of 25% and a moderate sized bifurcating ramus intermediate vessel.  He had normal left circumflex coronary artery.  An his RCA was a dominant vessel with focal 80% stenosis in the continuation branch just after the PDA takeoff.  He underwent successful PCI of the LAD ostium with ultimate insertion of a 3.5 x 15 mm Onyx frontiers DES stent postdilated to 3.8 mm with stenosis being reduced to 0% and TIMI 0 flow improved to TIMI-3.  Once the LAD was open there was evidence for 60% stenosis at the ostium of the diagonal vessel and diffuse irregularity of the LAD.  Subsequent to his catheterization, echo Doppler study done the following day showed an EF of 30% with no significant valvular abnormalities.  He was started on carvedilol, Entresto 24/26 but apparently developed hypotension and medication was discontinued.  Since his hospitalization he was seen in follow-up on June 03, 2021.  At that time blood pressure was stable and he was started back on carvedilol and low-dose losartan.  He was  subsequently evaluated by Coletta Memos, NP on June 07, 2021 and he has gradually been progressing his activity.  Presently, he denies any recurrent chest pain.  He has been participating in cardiac rehab.  He continues to be on aspirin and Brilinta, carvedilol 6.25 mg twice daily and losartan 12.5 mg daily.  He also was on rosuvastatin 20 mg for hyperlipidemia.  He continues to use CPAP therapy.  He has requested to transition his sleep care to me.  He last saw Dr. Radford Pax in a telemedicine visit in June 2021.  He has a ResMed air sense 10 CPAP unit which is set at 11 cm set pressure.  Set up date was October 26, 2014.  Due to the 5G upgrade his machine is no longer wireless.  [employer] is his DME.  He brought his machine with him and with a download was obtained which shows excellent compliance with average use of 7 hours and 7 minutes per night.  At 11 cm AHI is 1.3/h.  He presents for evaluation.   Past Medical History:  Diagnosis Date   Arthritis    CAD in native artery    a. STEMI 04/2021 s/p DES to LAD.   Essential hypertension    Hyperlipidemia with target LDL less than 70 02/02/2016   Hypertension    Morbid obesity ([place]) 07/07/2015   OSA (obstructive sleep apnea) 09/28/2015   Severe with AHI 47/hr with successful CPAP titration to 11cm H2O   Recurrent right inguinal  hernia s/p lap repair with mesh 06/09/14 06/01/2014    Past Surgical History:  Procedure Laterality Date   CARDIAC CATHETERIZATION     CORONARY STENT INTERVENTION N/A 05/27/2021   Procedure: CORONARY STENT INTERVENTION;  Surgeon: Troy Sine, MD;  Location: [facility];  Service: Cardiovascular;  Laterality: N/A;   CORONARY/GRAFT ACUTE MI REVASCULARIZATION N/A 05/27/2021   Procedure: Coronary/Graft Acute MI Revascularization;  Surgeon: Troy Sine, MD;  Location: [facility];  Service: Cardiovascular;  Laterality: N/A;   INGUINAL HERNIA REPAIR Right 10/30/2008   LEFT HEART CATH AND CORONARY ANGIOGRAPHY  N/A 05/27/2021   Procedure: LEFT HEART CATH AND CORONARY ANGIOGRAPHY;  Surgeon: Troy Sine, MD;  Location: [facility];  Service: Cardiovascular;  Laterality: N/A;   VENTRAL HERNIA REPAIR     Repaired twice by Dr. Johnathan Hausen    Current Medications: Outpatient Medications Prior to Visit  Medication Sig Dispense Refill   aspirin EC 81 MG tablet Take 81 mg by mouth in the morning. Swallow whole.     carvedilol (COREG) 6.25 MG tablet Take 1 tablet (6.25 mg total) by mouth 2 (two) times daily with a meal. 60 tablet 6   erythromycin ophthalmic ointment SMARTSIG:In Eye(s)     Glucosamine-Chondroitin (COSAMIN DS PO) Take 2 tablets by mouth in the morning. MoveWell     losartan (COZAAR) 25 MG tablet Take 0.5 tablets (12.5 mg total) by mouth at bedtime. 45 tablet 3   magnesium oxide (MAG-OX) 400 MG tablet Take 400 mg by mouth every evening.     metroNIDAZOLE (METROGEL) 1 % gel Apply 1 application topically daily as needed (rosacea flares).     nitroGLYCERIN (NITROSTAT) 0.4 MG SL tablet Place 1 tablet (0.4 mg total) under the tongue every 5 (five) minutes as needed for chest pain (up to 3 doses. If taking 3rd dose call 911). 25 tablet 3   OVER THE COUNTER MEDICATION Take 1 capsule by mouth in the morning and at bedtime. doTERRA Deep Blue Polyphenol Complex (frankincense, turmeric, green tea, ginger, pomegranate, and grape seed)     Respiratory Therapy Supplies (CARETOUCH 2 CPAP HOSE HANGER) MISC See admin instructions.     rosuvastatin (CRESTOR) 20 MG tablet Take 1 tablet (20 mg total) by mouth daily. 30 tablet 6   ticagrelor (BRILINTA) 90 MG TABS tablet Take 1 tablet (90 mg total) by mouth 2 (two) times daily. 60 tablet 11   No facility-administered medications prior to visit.     Allergies:   Lipitor [atorvastatin]   Social History   Socioeconomic History   Marital status: Married    Spouse name: Not on file   Number of children: 2   Years of education: 13   Highest education  level: High school graduate  Occupational History   Not on file  Tobacco Use   Smoking status: Never   Smokeless tobacco: Never  Vaping Use   Vaping Use: Never used  Substance and Sexual Activity   Alcohol use: Not Currently   Drug use: No   Sexual activity: Yes  Other Topics Concern   Not on file  Social History Narrative   Not on file   Social Determinants of Health   Financial Resource Strain: Low Risk    Difficulty of Paying Living Expenses: Not very hard  Food Insecurity: No Food Insecurity   Worried About Running Out of Food in the Last Year: Never true   [place] in the Last Year:  Never true  Transportation Needs: No Transportation Needs   Lack of Transportation (Medical): No   Lack of Transportation (Non-Medical): No  Physical Activity: Not on file  Stress: Not on file  Social Connections: Not on file     Family History:  The patient's family history includes Coronary artery disease in his paternal grandmother; Coronary artery disease (age of onset: 52) in his father; Diabetes in his paternal grandfather; Heart attack in his paternal grandmother; Hyperlipidemia in his father.   ROS General: Negative; No fevers, chills, or night sweats;  HEENT: Negative; No changes in vision or hearing, sinus congestion, difficulty swallowing Pulmonary: Negative; No cough, wheezing, shortness of breath, hemoptysis Cardiovascular: Negative; No chest pain, presyncope, syncope, palpitations GI: Negative; No nausea, vomiting, diarrhea, or abdominal pain GU: Negative; No dysuria, hematuria, or difficulty voiding Musculoskeletal: Negative; no myalgias, joint pain, or weakness Hematologic/Oncology: Negative; no easy bruising, bleeding Endocrine: Negative; no heat/cold intolerance; no diabetes Neuro: Negative; no changes in balance, headaches Skin: Negative; No rashes or skin lesions Psychiatric: Negative; No behavioral problems, depression Sleep: OSA on CPAP therapy since  December 2015; no residual snoring, daytime sleepiness, hypersomnolence, bruxism, restless legs, hypnogognic hallucinations, no cataplexy  An Epworth Sleepiness Scale score was calculated in the office today and this endorsed at 2 arguing against residual daytime sleepiness  Other comprehensive 14 point system review is negative.   PHYSICAL EXAM:   VS:  BP 118/76    Pulse 67    Ht '5\' 8"'  (1.727 m)    Wt 220 lb (99.8 kg)    SpO2 98%    BMI 33.45 kg/m     Repeat blood pressure by me was 115/74  Wt Readings from Last 3 Encounters:  10/20/21 220 lb (99.8 kg)  10/14/21 214 lb 1.1 oz (97.1 kg)  08/23/21 216 lb 7.9 oz (98.2 kg)    General: Alert, oriented, no distress.  Skin: normal turgor, no rashes, warm and dry HEENT: Normocephalic, atraumatic. Pupils equal round and reactive to light; sclera anicteric; extraocular muscles intact;  Nose without nasal septal hypertrophy Mouth/Parynx benign; Mallinpatti scale 3 Neck: No JVD, no carotid bruits; normal carotid upstroke Lungs: clear to ausculatation and percussion; no wheezing or rales Chest wall: without tenderness to palpitation Heart: PMI not displaced, RRR, s1 s2 normal, 1/6 systolic murmur, no diastolic murmur, no rubs, gallops, thrills, or heaves Abdomen: soft, nontender; no hepatosplenomehaly, BS+; abdominal aorta nontender and not dilated by palpation. Back: no CVA tenderness Pulses 2+ Musculoskeletal: full range of motion, normal strength, no joint deformities Extremities: no clubbing cyanosis or edema, Homan's sign negative  Neurologic: grossly nonfocal; Cranial nerves grossly wnl Psychologic: Normal mood and affect   Studies/Labs Reviewed:   EKG:  EKG is ordered today.  ECG (independently read by me):  NSR at 67, no ST changes, no ectopy  Recent Labs: BMP Latest Ref Rng & Units 06/03/2021 05/29/2021 05/28/2021  Glucose 70 - 99 mg/dL 103(H) 119(H) 117(H)  BUN 6 - 20 mg/dL '14 12 15  ' Creatinine 3.20 - 1.24 mg/dL 0.82 0.86 0.83   Sodium 135 - 145 mmol/L 135 135 137  Potassium 3.5 - 5.1 mmol/L 3.9 4.0 4.3  Chloride 98 - 111 mmol/L 102 103 107  CO2 22 - 32 mmol/L '26 25 23  ' Calcium 8.9 - 10.3 mg/dL 9.3 9.1 8.3(L)     Hepatic Function Latest Ref Rng & Units 07/08/2021 05/27/2021 07/24/2018  Total Protein 6.0 - 8.5 g/dL 6.9 7.3 -  Albumin 3.8 - 4.9 g/dL 4.6  4.3 -  AST 0 - 40 IU/L 25 35 -  ALT 0 - 44 IU/L 26 43 35  Alk Phosphatase 44 - 121 IU/L 93 80 -  Total Bilirubin 0.0 - 1.2 mg/dL 0.9 1.0 -  Bilirubin, Direct 0.00 - 0.40 mg/dL 0.29 - -    CBC Latest Ref Rng & Units 06/03/2021 05/29/2021 05/28/2021  WBC 4.0 - 10.5 K/uL 6.4 8.4 10.1  Hemoglobin 13.0 - 17.0 g/dL 14.6 15.1 13.0  Hematocrit 39.0 - 52.0 % 41.7 45.6 39.3  Platelets 150 - 400 K/uL 245 242 195   Lab Results  Component Value Date   MCV 87.8 06/03/2021   MCV 90.5 05/29/2021   MCV 91.2 05/28/2021   No results found for: TSH Lab Results  Component Value Date   HGBA1C 5.7 (H) 05/27/2021     BNP    Component Value Date/Time   BNP 30.4 06/03/2021 1133    ProBNP No results found for: PROBNP   Lipid Panel     Component Value Date/Time   CHOL 102 07/08/2021 0741   TRIG 62 07/08/2021 0741   HDL 44 07/08/2021 0741   CHOLHDL 2.3 07/08/2021 0741   CHOLHDL 3.6 05/27/2021 1630   VLDL 17 05/27/2021 1630   LDLCALC 44 07/08/2021 0741   LABVLDL 14 07/08/2021 0741     RADIOLOGY: No results found.   Additional studies/ records that were reviewed today include:   EMERGENT CATH/PCI: 05/27/2021    Lat 1st Diag lesion is 50% stenosed.   Mid LAD lesion is 50% stenosed.   1st Diag lesion is 60% stenosed.   Mid LAD to Dist LAD lesion is 40% stenosed.   RPAV lesion is 80% stenosed.   Ost LAD to Prox LAD lesion is 100% stenosed.   A drug-eluting stent was successfully placed.   Post intervention, there is a 0% residual stenosis.   Acute ST segment elevation anterior wall myocardial infarction secondary to total occlusion of the LAD at its ostium  with TIMI 0 flow and no evidence for collaterals.   Mild narrowing of 25% in the moderate-sized bifurcating ramus intermediate vessel.   Normal left circumflex coronary artery.   Dominant RCA with focal 80% stenosis in the continuation branch just after the PDA takeoff.   Difficult but successful PCI of the LAD ostium with ultimate insertion of a 3.5 x 15 mm Resolute Frontier DES stent postdilated to 3.80 mm with the100% occlusion being reduced to 0% and TIMI 0 flow improved to TIMI-3 flow.  Once the LAD was open, there is evidence for 60% stenosis at the ostium of the diagonal vessel and diffuse irregularity of 40% in the mid LAD.   Elevated LVEDP at 58mHg.   RECOMMENDATION: DAPT for minimum of 1 year. Medical therapy for concomitant CAD with possible need for intervention to the distal RCA.  A 2D echo Doppler study will be ordered for assessment of LV function with hopeful improvement and salvage of myocardium with time.  Recommend more aggressive lipid therapy and suggest changing pravastatin to rosuvastatin in this patient intolerant to previous atorvastatin.  Plan to initiate carvedilol and possible ARB therapy in a.m. as blood pressure and heart rate allows.    Intervention       ECHO: 05/28/2021 IMPRESSIONS   1. Left ventricular ejection fraction, by estimation, is 30%. The left  ventricle has severely decreased function. The left ventricle demonstrates  regional wall motion abnormalities (suggestive of LAD disease).   2. Right ventricular systolic function is  not well visualized but is  grossly normal. The right ventricular size is normal.   3. The mitral valve is grossly normal. No evidence of mitral valve  regurgitation.   4. The aortic valve was not well visualized. Aortic valve regurgitation  is not visualized. No aortic stenosis is present.   5. The inferior vena cava is normal in size with <50% respiratory  variability, suggesting right atrial pressure of 8 mmHg.    Comparison(s): A prior study was performed on 08/22/2017. Decrease in LVEF  with new wall motion abnormalities; technically difficult study.    ECHO: 06/24/2021 IMPRESSIONS   1. Left ventricular ejection fraction, by estimation, is 65 to 70%. The  left ventricle has normal function. The left ventricle has no regional  wall motion abnormalities. Left ventricular diastolic parameters were  normal.   2. Right ventricular systolic function is normal. The right ventricular  size is normal.   3. The mitral valve is normal in structure. No evidence of mitral valve  regurgitation.   4. The aortic valve is tricuspid. Aortic valve regurgitation is not  visualized.   5. The inferior vena cava is normal in size with greater than 50%  respiratory variability, suggesting right atrial pressure of 3 mmHg.   Comparison(s): The left ventricular function has improved.   ASSESSMENT:    1. ST elevation myocardial infarction involving left anterior descending (LAD) coronary artery ([place]): 05/27/2021; DES stent to LAD ostium   2. CAD in native artery   3. Ischemic cardiomyopathy: resolved   4. OSA (obstructive sleep apnea) on CPAP   5. Hyperlipidemia with target LDL less than 70   6. Obesity (BMI 30-39.9)     PLAN:  Mr. Eliud Polo is a 55 year old gentleman who has a history of hypertension, obstructive sleep apnea, and CAD.  He presented with an acute anterior wall myocardial infarction on May 27, 2021 secondary to ostial occlusion of a large LAD with TIMI 0 flow.  The procedure was difficult but ultimately successful and he underwent insertion of a 3.5 x 15 mm Onyx frontiers DES stent to the LAD ostium postdilated to 3.80 without a percent occlusion being reduced to 0% and TIMI 0 flow improving to TIMI-3 flow.  He had concomitant CAD involving the diagonal vessel and diffuse irregularity with 40% stenosis of the mid LAD.  There also was 80% stenosis in a small caliber continuation branch of the  distal RCA after the PDA takeoff.  Initial EF was depressed at 30% and he was initially treated with low-dose Entresto in addition to carvedilol.  Ultimately due to low blood pressure Entresto was discontinued.  Subsequent echo Doppler study has now demonstrated complete normalization and salvage of myocardium with EF now at 65 to 70% without wall motion abnormalities.  His blood pressure today is excellent and a repeat by me was 115/74 he continues to be on carvedilol 6.25 mg twice a day in addition to low-dose losartan 12.5 mg daily.  He is on DAPT with aspirin/Brilinta we will continue this for minimum of 1 year and probably longer due to the location of his ostial LAD stent.  He is on rosuvastatin 20 mg.  Recent LDL cholesterol was now excellent at 44 total cholesterol 102 triglycerides 62.  Creatinine is stable at 0.82.  He has not had any recurrent anginal symptomatology presyncope or syncope or exertional dyspnea.  He has been on CPAP therapy for obstructive sleep apnea which was documented in December 2015.  His compliance over  the years has been excellent.  He had requested that I assume his total care including sleep medicine.  Since his machine is 55 years old and he is compliant he qualifies for a new machine.  His current machine is no longer wireless due to the 5G upgrade.  His DME company is [employer].  We will place an order for him to receive an air sense 11 auto CPAP unit.  I discussed with him supply chain issues creating delays in receiving equipment.  We discussed the importance of exercise and weight loss.  BMI is 33.45 and is consistent with obesity.  He sees Dr. [name] for his primary care.  I will see him in 6 months for reevaluation or sooner as needed.   Medication Adjustments/Labs and Tests Ordered: Current medicines are reviewed at length with the patient today.  Concerns regarding medicines are outlined above.  Medication changes, Labs and Tests ordered today are listed in the  Patient Instructions below. Patient Instructions  Medication Instructions:  Your physician recommends that you continue on your current medications as directed. Please refer to the Current Medication list given to you today.  *If you need a refill on your cardiac medications before your next appointment, please call your pharmacy*   Follow-Up: At [facility], you and your health needs are our priority.  As part of our continuing mission to provide you with exceptional heart care, we have created designated Provider Care Teams.  These Care Teams include your primary Cardiologist (physician) and Advanced Practice Providers (APPs -  Physician Assistants and Nurse Practitioners) who all work together to provide you with the care you need, when you need it.  We recommend signing up for the patient portal called "MyChart".  Sign up information is provided on this After Visit Summary.  MyChart is used to connect with patients for Virtual Visits (Telemedicine).  Patients are able to view lab/test results, encounter notes, upcoming appointments, etc.  Non-urgent messages can be sent to your provider as well.   To learn more about what you can do with MyChart, go to [email or web address].    Your next appointment:   6 month(s)  The format for your next appointment:   In Person  Provider:   [name], MD {     Signed, [name], MD  10/22/2021 12:46 PM    [facility] [address] [place], [place], [place]  [address] Phone: [phone]

## 2021-10-21 ENCOUNTER — Encounter (HOSPITAL_COMMUNITY): Payer: BC Managed Care – PPO

## 2021-10-21 ENCOUNTER — Encounter (HOSPITAL_COMMUNITY)
Admission: RE | Admit: 2021-10-21 | Discharge: 2021-10-21 | Disposition: A | Discharge status: Home / Self Care | Payer: BC Managed Care – PPO | Source: Ambulatory Visit | Attending: Cardiovascular Disease | Admitting: Cardiovascular Disease

## 2021-10-21 ENCOUNTER — Other Ambulatory Visit: Payer: Self-pay

## 2021-10-21 DIAGNOSIS — Z955 Presence of coronary angioplasty implant and graft: Secondary | ICD-10-CM | POA: Diagnosis not present

## 2021-10-21 DIAGNOSIS — I213 ST elevation (STEMI) myocardial infarction of unspecified site: Secondary | ICD-10-CM

## 2021-10-22 ENCOUNTER — Encounter: Payer: Self-pay | Admitting: Cardiovascular Disease

## 2021-10-26 ENCOUNTER — Ambulatory Visit (HOSPITAL_COMMUNITY): Payer: BC Managed Care – PPO

## 2021-10-28 ENCOUNTER — Ambulatory Visit (HOSPITAL_COMMUNITY): Payer: BC Managed Care – PPO

## 2021-11-01 ENCOUNTER — Telehealth: Payer: Self-pay | Admitting: *Deleted

## 2021-11-01 NOTE — Telephone Encounter (Signed)
-----   Message from Fidel Levy, RN sent at 10/20/2021  4:19 PM EST ----- Regarding: res med air sense 11 -- auto Please contact patient  Per Dr. Claiborne Billings: res med air sense 17 -- auto  [place]

## 2021-11-01 NOTE — Telephone Encounter (Signed)
Order for ResMed AirSense 11 Auto sent to [employer] via [employer] portal.

## 2021-11-13 DIAGNOSIS — H6982 Other specified disorders of Eustachian tube, left ear: Secondary | ICD-10-CM | POA: Diagnosis not present

## 2021-11-16 DIAGNOSIS — H6122 Impacted cerumen, left ear: Secondary | ICD-10-CM | POA: Diagnosis not present

## 2021-11-18 ENCOUNTER — Other Ambulatory Visit: Payer: Self-pay | Admitting: Physician Assistant

## 2021-11-18 NOTE — Telephone Encounter (Signed)
This is Dr. Kelly's pt. °

## 2021-11-19 DIAGNOSIS — G4733 Obstructive sleep apnea (adult) (pediatric): Secondary | ICD-10-CM | POA: Diagnosis not present

## 2021-11-23 DIAGNOSIS — G4733 Obstructive sleep apnea (adult) (pediatric): Secondary | ICD-10-CM | POA: Diagnosis not present

## 2021-12-24 DIAGNOSIS — G4733 Obstructive sleep apnea (adult) (pediatric): Secondary | ICD-10-CM | POA: Diagnosis not present

## 2022-01-12 DIAGNOSIS — M1711 Unilateral primary osteoarthritis, right knee: Secondary | ICD-10-CM | POA: Diagnosis not present

## 2022-01-17 ENCOUNTER — Emergency Department (HOSPITAL_COMMUNITY): Payer: BC Managed Care – PPO

## 2022-01-17 ENCOUNTER — Emergency Department (HOSPITAL_COMMUNITY)
Admission: EM | Admit: 2022-01-17 | Discharge: 2022-01-17 | Disposition: A | Discharge status: Home / Self Care | Payer: BC Managed Care – PPO | Attending: Emergency Medicine | Admitting: Emergency Medicine

## 2022-01-17 DIAGNOSIS — R61 Generalized hyperhidrosis: Secondary | ICD-10-CM | POA: Diagnosis not present

## 2022-01-17 DIAGNOSIS — R110 Nausea: Secondary | ICD-10-CM | POA: Diagnosis not present

## 2022-01-17 DIAGNOSIS — Z7982 Long term (current) use of aspirin: Secondary | ICD-10-CM | POA: Diagnosis not present

## 2022-01-17 DIAGNOSIS — R457 State of emotional shock and stress, unspecified: Secondary | ICD-10-CM | POA: Diagnosis not present

## 2022-01-17 DIAGNOSIS — R42 Dizziness and giddiness: Secondary | ICD-10-CM | POA: Diagnosis not present

## 2022-01-17 DIAGNOSIS — R519 Headache, unspecified: Secondary | ICD-10-CM | POA: Diagnosis not present

## 2022-01-17 DIAGNOSIS — R079 Chest pain, unspecified: Secondary | ICD-10-CM | POA: Diagnosis not present

## 2022-01-17 DIAGNOSIS — I639 Cerebral infarction, unspecified: Secondary | ICD-10-CM | POA: Diagnosis not present

## 2022-01-17 LAB — BASIC METABOLIC PANEL
Anion gap: 10 (ref 5–15)
BUN: 15 mg/dL (ref 6–20)
CO2: 25 mmol/L (ref 22–32)
Calcium: 8.9 mg/dL (ref 8.9–10.3)
Chloride: 103 mmol/L (ref 98–111)
Creatinine, Ser: 0.79 mg/dL (ref 0.61–1.24)
GFR, Estimated: >60 mL/min (ref >60)
Glucose, Bld: 149 mg/dL — ABNORMAL HIGH (ref 70–99)
Potassium: 4.3 mmol/L (ref 3.5–5.1)
Sodium: 138 mmol/L (ref 135–145)

## 2022-01-17 LAB — CBC WITH DIFFERENTIAL/PLATELET
Abs Immature Granulocytes: 0.07 10*3/uL (ref 0.00–0.07)
Basophils Absolute: 0 10*3/uL (ref 0.0–0.1)
Basophils Relative: 0 %
Eosinophils Absolute: 0.1 10*3/uL (ref 0.0–0.5)
Eosinophils Relative: 1 %
HCT: 44.7 % (ref 39.0–52.0)
Hemoglobin: 15 g/dL (ref 13.0–17.0)
Immature Granulocytes: 1 %
Lymphocytes Relative: 3 %
Lymphs Abs: 0.3 10*3/uL — ABNORMAL LOW (ref 0.7–4.0)
MCH: 30.7 pg (ref 26.0–34.0)
MCHC: 33.6 g/dL (ref 30.0–36.0)
MCV: 91.4 fL (ref 80.0–100.0)
Monocytes Absolute: 0.5 10*3/uL (ref 0.1–1.0)
Monocytes Relative: 4 %
Neutro Abs: 9.7 10*3/uL — ABNORMAL HIGH (ref 1.7–7.7)
Neutrophils Relative %: 91 %
Platelets: 206 10*3/uL (ref 150–400)
RBC: 4.89 MIL/uL (ref 4.22–5.81)
RDW: 13 % (ref 11.5–15.5)
WBC: 10.7 10*3/uL — ABNORMAL HIGH (ref 4.0–10.5)
nRBC: 0 % (ref 0.0–0.2)

## 2022-01-17 LAB — TROPONIN I (HIGH SENSITIVITY)
Troponin I (High Sensitivity): 3 ng/L (ref <18)
Troponin I (High Sensitivity): 3 ng/L (ref <18)

## 2022-01-17 IMAGING — DX DG CHEST 2V
2 series · 2 of 2 positions shown · non-contrast
Comparison: Chest x-ray [DATE].

CLINICAL DATA: Pain and lightheadedness.

EXAM:
CHEST - 2 VIEW

[w chest pa]
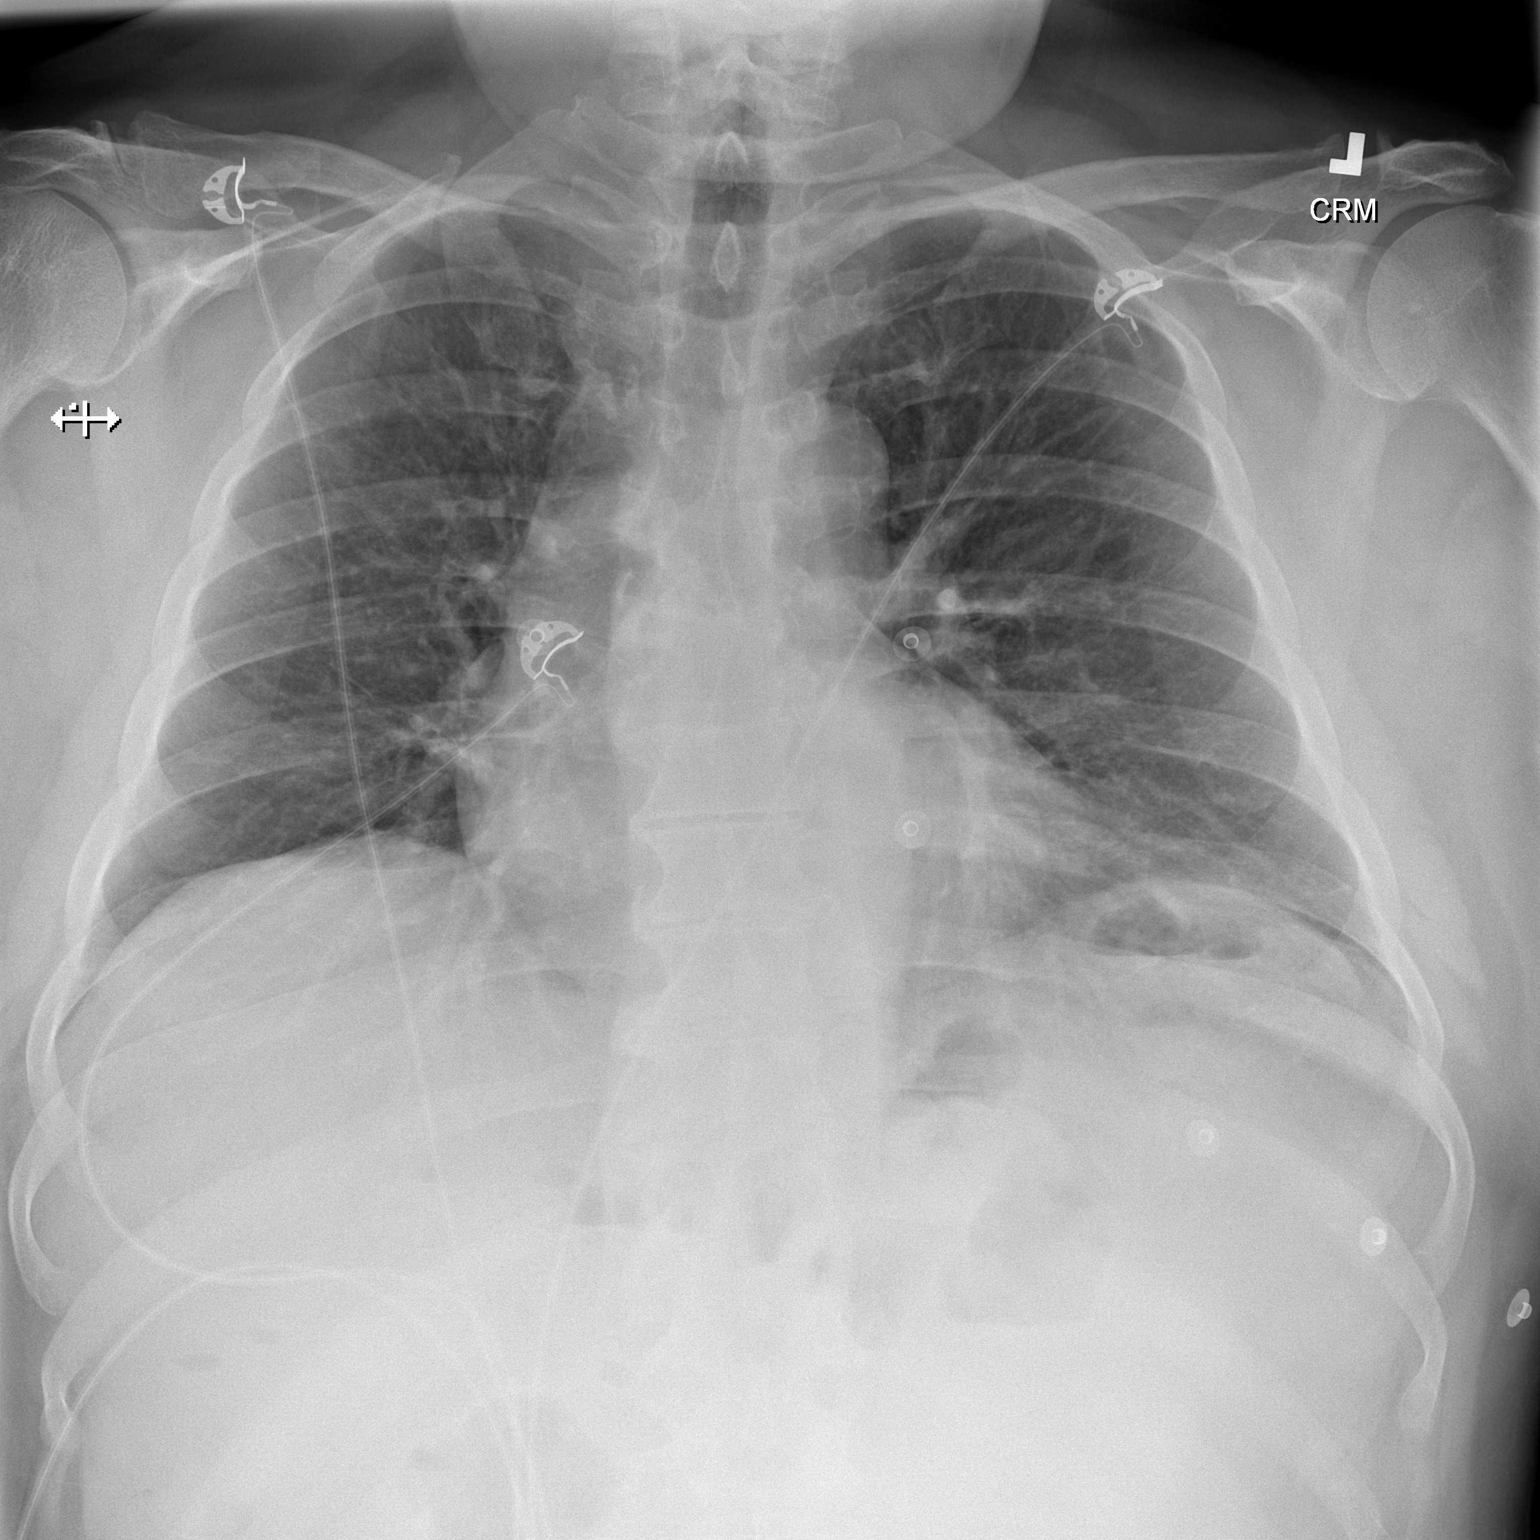

[w chest lat]
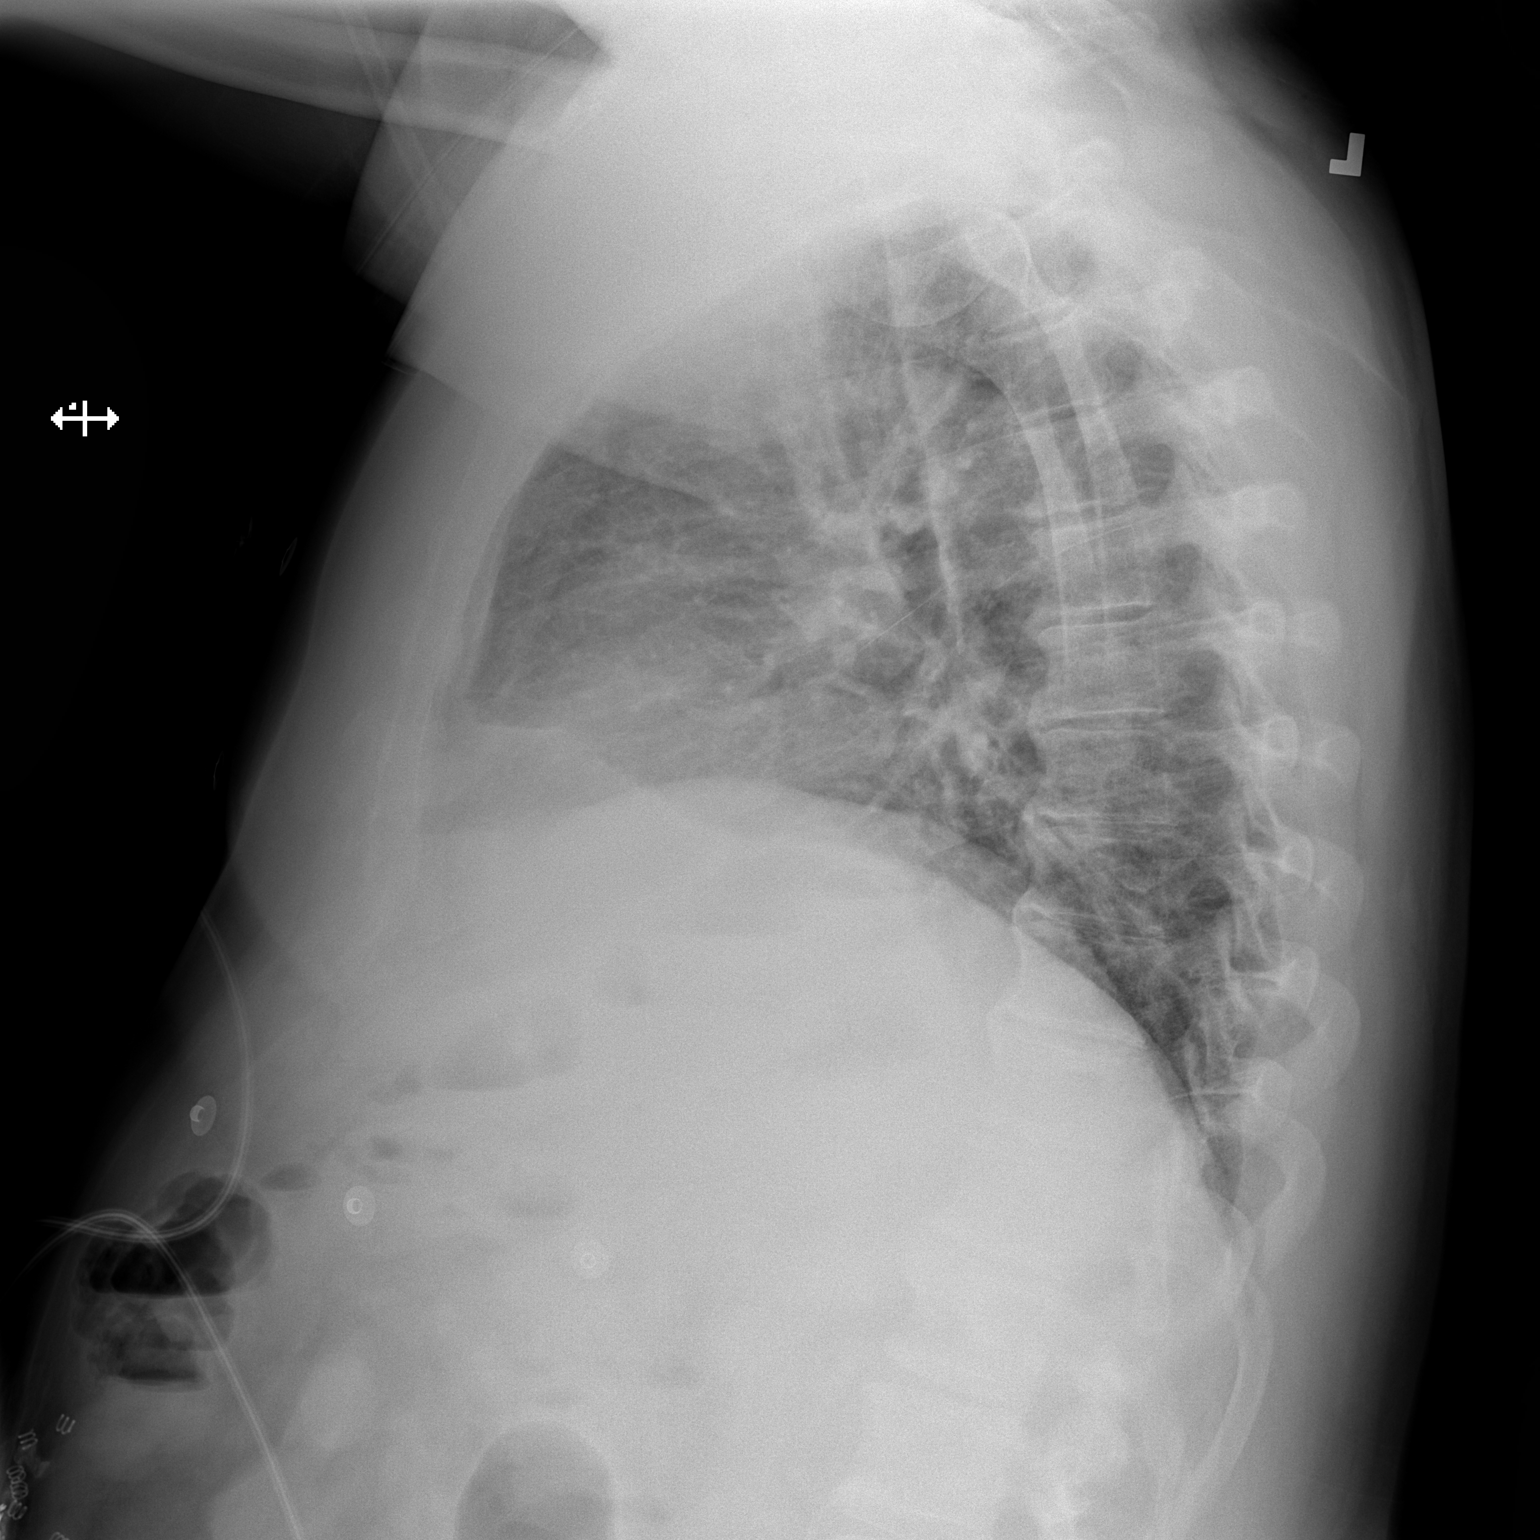

[2 of 2 positions shown; findings below may reference images not displayed]

FINDINGS: The heart size and mediastinal contours are within normal limits.
Both lungs are clear. The visualized skeletal structures are
unremarkable.
IMPRESSION: No active cardiopulmonary disease.

## 2022-01-17 IMAGING — CT CT ANGIO HEAD-NECK (W OR W/O PERF)
1 of 11 series · 5 of 33 positions shown · non-contrast
Comparison: None.

CLINICAL DATA: Dizziness and headache

EXAM:
CT ANGIOGRAPHY HEAD AND NECK
TECHNIQUE: Multidetector CT imaging of the head and neck was performed using
the standard protocol during bolus administration of intravenous
contrast. Multiplanar CT image reconstructions and MIPs were
obtained to evaluate the vascular anatomy. Carotid stenosis
measurements (when applicable) are obtained utilizing NASCET
criteria, using the distal internal carotid diameter as the
denominator.

[Series 11: cta neck axial · axial · 0.44mm/px · z∈[-302,-64]mm · 5 of 359 slices shown]
[im 60/359  soft-tissue]
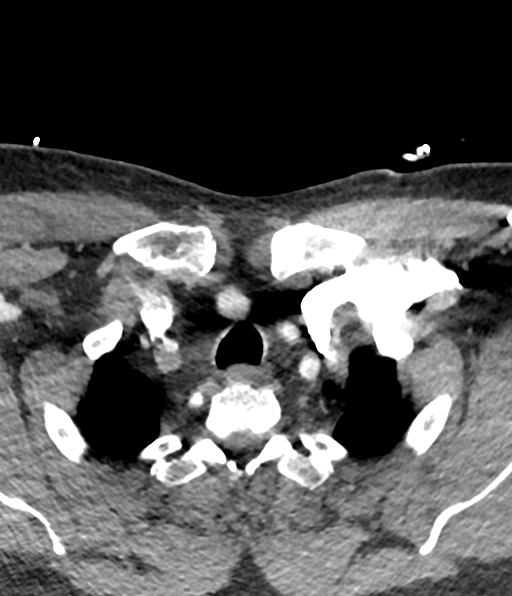
[im 120/359  bone]
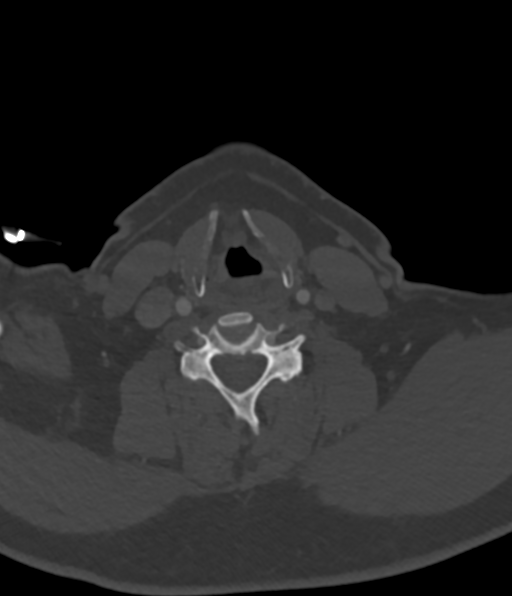
[im 180/359  soft-tissue]
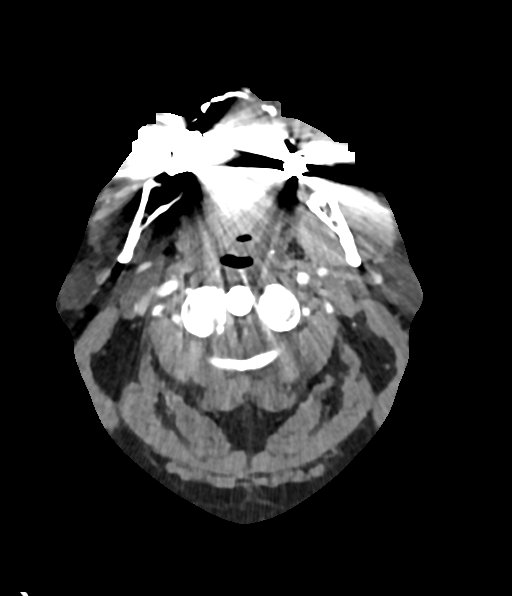
[im 239/359  bone]
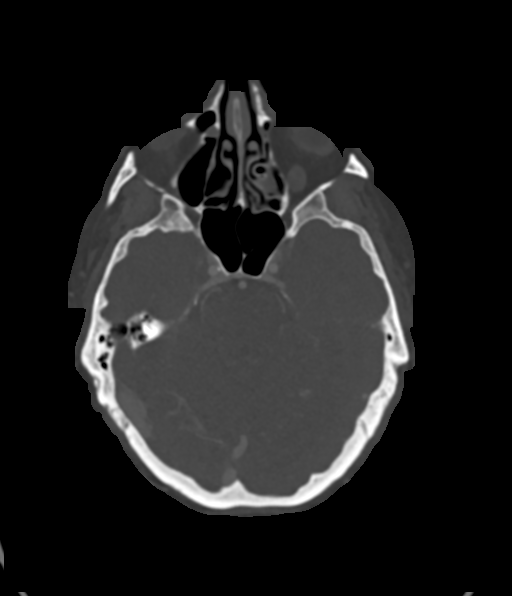
[im 299/359  soft-tissue]
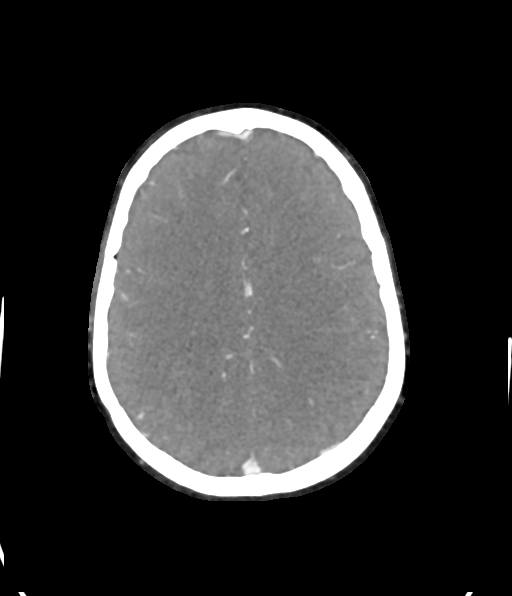

[5 of 33 positions shown; findings below may reference images not displayed]

RADIATION DOSE REDUCTION: This exam was performed according to the
departmental dose-optimization program which includes automated
exposure control, adjustment of the mA and/or kV according to
patient size and/or use of iterative reconstruction technique.

CONTRAST:  75mL OMNIPAQUE IOHEXOL 350 MG/ML SOLN
FINDINGS: CT HEAD FINDINGS

Brain: There is no mass, hemorrhage or extra-axial collection. The
size and configuration of the ventricles and extra-axial CSF spaces
are normal. There is no acute or chronic infarction. The brain
parenchyma is normal.

Skull: The visualized skull base, calvarium and extracranial soft
tissues are normal.

Sinuses/Orbits: No fluid levels or advanced mucosal thickening of
the visualized paranasal sinuses. No mastoid or middle ear effusion.
The orbits are normal.

CTA NECK FINDINGS

SKELETON: There is no bony spinal canal stenosis. No lytic or
blastic lesion.

OTHER NECK: Normal pharynx, larynx and major salivary glands. No
cervical lymphadenopathy. Unremarkable thyroid gland.

UPPER CHEST: No pneumothorax or pleural effusion. No nodules or
masses.

AORTIC ARCH:

There is no calcific atherosclerosis of the aortic arch. There is no
aneurysm, dissection or hemodynamically significant stenosis of the
visualized portion of the aorta. Conventional 3 vessel aortic
branching pattern. The visualized proximal subclavian arteries are
widely patent.

RIGHT CAROTID SYSTEM: Normal without aneurysm, dissection or
stenosis.

LEFT CAROTID SYSTEM: Normal without aneurysm, dissection or
stenosis.

VERTEBRAL ARTERIES: Left dominant configuration. Both origins are
clearly patent. There is no dissection, occlusion or flow-limiting
stenosis to the skull base (V1-V3 segments).

CTA HEAD FINDINGS

POSTERIOR CIRCULATION:

--Vertebral arteries: Normal V4 segments.

--Inferior cerebellar arteries: Normal.

--Basilar artery: Normal.

--Superior cerebellar arteries: Normal.

--Posterior cerebral arteries (PCA): Normal.

ANTERIOR CIRCULATION:

--Intracranial internal carotid arteries: Normal.

--Anterior cerebral arteries (ACA): Normal. Both A1 segments are
present. Patent anterior communicating artery (a-comm).

--Middle cerebral arteries (MCA): Normal.

VENOUS SINUSES: As permitted by contrast timing, patent.

ANATOMIC VARIANTS: None

Review of the MIP images confirms the above findings.
IMPRESSION: Normal CTA of the head and neck.

## 2022-01-17 MED ORDER — DIPHENHYDRAMINE HCL 50 MG/ML IJ SOLN
12.5000 mg | Freq: Once | INTRAMUSCULAR | Status: AC
  Administered 2022-01-17: 12.5 mg via INTRAVENOUS
  Filled 2022-01-17: qty 1

## 2022-01-17 MED ORDER — IOHEXOL 350 MG/ML SOLN
75.0000 mL | Freq: Once | INTRAVENOUS | Status: AC | PRN
  Administered 2022-01-17: 75 mL via INTRAVENOUS

## 2022-01-17 MED ORDER — METOCLOPRAMIDE HCL 5 MG/ML IJ SOLN
10.0000 mg | Freq: Once | INTRAMUSCULAR | Status: AC
  Administered 2022-01-17: 10 mg via INTRAVENOUS
  Filled 2022-01-17: qty 2

## 2022-01-17 MED ORDER — SODIUM CHLORIDE 0.9 % IV BOLUS
500.0000 mL | Freq: Once | INTRAVENOUS | Status: AC
Start: 2022-01-17 — End: 2022-01-17
  Administered 2022-01-17: 500 mL via INTRAVENOUS

## 2022-01-17 MED ORDER — MORPHINE SULFATE (PF) 4 MG/ML IV SOLN
4.0000 mg | Freq: Once | INTRAVENOUS | Status: DC
  Filled 2022-01-17: qty 1

## 2022-01-17 NOTE — ED Provider Triage Note (Signed)
Emergency Medicine Provider Triage Evaluation Note ? ?Brandon Villanueva , a 56 y.o. male  was evaluated in triage.  Pt states that while he was at work earlier today he had sudden onset lightheadedness diaphoresis and nausea.  He states that this episode lasted several minutes and then subsided but has since returned again.  He states that he ate a full meal this morning and is not diabetic.  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  He denies any pain or other symptoms. ? ?Review of Systems  ?Positive: Nausea, lightheadedness, diaphoresis ?Negative: Chest pain, shortness of breath vomiting, diarrhea ? ?Physical Exam  ?BP 123/82 (BP Location: Left Arm)   Pulse 85   Temp 98.1 ?F (36.7 ?C) (Oral)   Resp 16   SpO2 95%  ?Gen:   Awake, no distress   ?Resp:  Normal effort  ?MSK:   Moves extremities without difficulty  ?Other:   ? ?Medical Decision Making  ?Medically screening exam initiated at 12:23 PM.  Appropriate orders placed.  Brandon Villanueva. was informed that the remainder of the evaluation will be completed by another provider, this initial triage assessment does not replace that evaluation, and the importance of remaining in the ED until their evaluation is complete. ? ? ?  ?Silva Bandy, PA-C ?01/17/22 1227 ? ?

## 2022-01-17 NOTE — Discharge Instructions (Signed)
Your work-up today was reassuring ? ?Make sure to rest, drink plenty of fluids at home ? ?Return for new or worsening symptoms ?

## 2022-01-17 NOTE — ED Provider Notes (Signed)
?[facility] EMERGENCY DEPARTMENT ?Provider Note ? ? ?CSN: [id number] ?Arrival date & time: 01/17/22  1159 ? ?  ? ?History ? ?Chief Complaint  ?Patient presents with  ? diaphoresis  ? Nausea  ? Dizziness  ? ? ?Brandon Villanueva. is a 56 y.o. male here for evaluation of diaphoresis, lightheadedness, nausea, dizziness earlier today.  States he does have history of vertigo however this felt different.  No sudden onset thunderclap headache at symptom onset however 5 hours later does have a headache now.  He denies any vision changes, numbness, weakness, neck pain, chest pain, shortness of breath, abdominal pain, back pain. No hx of AAA, dissection.  No meds PTA.  Did have a STEMI this summer, symptoms today not similar to prior cardiac history.  He denies any pleuritic or exertional chest pain.  He is compliant with his home meds. ? ?HPI ? ?  ? ?Home Medications ?Prior to Admission medications   ?Medication Sig Start Date End Date Taking? Authorizing Provider  ?aspirin EC 81 MG tablet Take 81 mg by mouth daily.   Yes [provider]  ?carvedilol (COREG) 6.25 MG tablet TAKE 1 TABLET BY MOUTH 2 TIMES DAILY WITH A MEAL. ?Patient taking differently: Take 6.25 mg by mouth 2 (two) times daily with a meal. 11/18/21  Yes Lennette Bihari, MD  ?Glucosamine-Chondroitin (COSAMIN DS PO) Take 2 tablets by mouth daily.   Yes [provider]  ?losartan (COZAAR) 25 MG tablet Take 0.5 tablets (12.5 mg total) by mouth at bedtime. 06/03/21 01/17/22 Yes Angelita Ingles, MD  ?magnesium oxide (MAG-OX) 400 MG tablet Take 400 mg by mouth every evening.   Yes [provider]  ?metroNIDAZOLE (METROGEL) 1 % gel Apply 1 application topically daily as needed (rosacea flares). 06/09/21  Yes [provider]  ?nitroGLYCERIN (NITROSTAT) 0.4 MG SL tablet Place 1 tablet (0.4 mg total) under the tongue every 5 (five) minutes as needed for chest pain (up to 3 doses. If taking 3rd dose call 911). 05/29/21  Yes  Dunn, Dayna N, PA-C  ?rosuvastatin (CRESTOR) 20 MG tablet TAKE 1 TABLET BY MOUTH EVERY DAY ?Patient taking differently: Take 20 mg by mouth daily. 11/18/21  Yes Lennette Bihari, MD  ?ticagrelor (BRILINTA) 90 MG TABS tablet Take 1 tablet (90 mg total) by mouth 2 (two) times daily. 05/29/21  Yes Dunn, Tacey Ruiz, PA-C  ?Respiratory Therapy Supplies (CARETOUCH 2 CPAP HOSE HANGER) MISC See admin instructions. 08/22/16   [provider]  ?   ? ?Allergies    ?Erythromycin, Lipitor [atorvastatin], and Sumatriptan   ? ?Review of Systems   ?Review of Systems  ?Constitutional:  Positive for diaphoresis. Negative for activity change, appetite change, chills, fatigue, fever and unexpected weight change.  ?HENT: Negative.    ?Respiratory: Negative.    ?Cardiovascular: Negative.   ?Gastrointestinal:  Positive for nausea. Negative for abdominal distention, abdominal pain, anal bleeding, blood in stool, constipation, diarrhea, rectal pain and vomiting.  ?Musculoskeletal: Negative.   ?Skin: Negative.   ?Neurological:  Positive for dizziness, light-headedness and headaches. Negative for tremors, seizures, syncope, facial asymmetry, speech difficulty, weakness and numbness.  ?All other systems reviewed and are negative. ? ?Physical Exam ?Updated Vital Signs ?BP 135/78 (BP Location: Right Arm)   Pulse 83   Temp 98.3 ?F (36.8 ?C) (Oral)   Resp 20   SpO2 98%  ?Physical Exam ?Physical Exam  ?Constitutional: Pt is oriented to person, place, and time. Pt appears well-developed and well-nourished.  No distress.  ?HENT:  ?Head: Normocephalic and atraumatic.  ?Mouth/Throat: Oropharynx is clear and moist.  ?Eyes: Conjunctivae and EOM are normal. Pupils are equal, round, and reactive to light. No scleral icterus.  ?No horizontal, vertical or rotational nystagmus ?Neck: Normal range of motion. Neck supple.  ?Full active and passive ROM without pain ?No midline or paraspinal tenderness ?No nuchal rigidity or meningeal signs   ?Cardiovascular: Normal rate, regular rhythm and intact distal pulses.   ?Pulmonary/Chest: Effort normal and breath sounds normal. No respiratory distress. Pt has no wheezes. No rales.  ?Abdominal: Soft. Bowel sounds are normal. There is no tenderness. There is no rebound and no guarding.  ?Musculoskeletal: Normal range of motion.  ?Lymphadenopathy:  ?  No cervical adenopathy.  ?Neurological: Pt. is alert and oriented to person, place, and time. He has normal reflexes. No cranial nerve deficit.  Exhibits normal muscle tone. Coordination normal.  ?Mental Status:  ?Alert, oriented, thought content appropriate. Speech fluent without evidence of aphasia. Able to follow 2 step commands without difficulty.  ?Cranial Nerves:  ?II:  Peripheral visual fields grossly normal, pupils equal, round, reactive to light ?III,IV, VI: ptosis not present, extra-ocular motions intact bilaterally  ?V,VII: smile symmetric, facial light touch sensation equal ?VIII: hearing grossly normal bilaterally  ?IX,X: midline uvula rise  ?XI: bilateral shoulder shrug equal and strong ?XII: midline tongue extension  ?Motor:  ?5/5 in upper and lower extremities bilaterally including strong and equal grip strength and dorsiflexion/plantar flexion ?Sensory: Pinprick and light touch normal in all extremities.  ?Deep Tendon Reflexes: 2+ and symmetric  ?Cerebellar: normal finger-to-nose with bilateral upper extremities ?Gait: normal gait and balance ?CV: distal pulses palpable throughout   ?Skin: Skin is warm and dry. No rash noted. Pt is not diaphoretic.  ?Psychiatric: Pt has a normal mood and affect. Behavior is normal. Judgment and thought content normal.  ?Nursing note and vitals reviewed.  ?ED Results / Procedures / Treatments   ?Labs ?(all labs ordered are listed, but only abnormal results are displayed) ?Labs Reviewed  ?CBC WITH DIFFERENTIAL/PLATELET - Abnormal; Notable for the following components:  ?    Result Value  ? WBC 10.7 (*)   ? Neutro Abs  9.7 (*)   ? Lymphs Abs 0.3 (*)   ? All other components within normal limits  ?BASIC METABOLIC PANEL - Abnormal; Notable for the following components:  ? Glucose, Bld 149 (*)   ? All other components within normal limits  ?TROPONIN I (HIGH SENSITIVITY)  ?TROPONIN I (HIGH SENSITIVITY)  ? ? ?EKG ?None ? ?Radiology ?CT ANGIO HEAD NECK W WO CM ? ?Result Date: 01/17/2022 ?CLINICAL DATA:  Dizziness and headache EXAM: CT ANGIOGRAPHY HEAD AND NECK TECHNIQUE: Multidetector CT imaging of the head and neck was performed using the standard protocol during bolus administration of intravenous contrast. Multiplanar CT image reconstructions and MIPs were obtained to evaluate the vascular anatomy. Carotid stenosis measurements (when applicable) are obtained utilizing NASCET criteria, using the distal internal carotid diameter as the denominator. RADIATION DOSE REDUCTION: This exam was performed according to the departmental dose-optimization program which includes automated exposure control, adjustment of the mA and/or kV according to patient size and/or use of iterative reconstruction technique. CONTRAST:  66mL OMNIPAQUE IOHEXOL 350 MG/ML SOLN COMPARISON:  None. FINDINGS: CT HEAD FINDINGS Brain: There is no mass, hemorrhage or extra-axial collection. The size and configuration of the ventricles and extra-axial CSF spaces are normal. There is no acute or chronic infarction. The brain parenchyma is normal. Skull: The  visualized skull base, calvarium and extracranial soft tissues are normal. Sinuses/Orbits: No fluid levels or advanced mucosal thickening of the visualized paranasal sinuses. No mastoid or middle ear effusion. The orbits are normal. CTA NECK FINDINGS SKELETON: There is no bony spinal canal stenosis. No lytic or blastic lesion. OTHER NECK: Normal pharynx, larynx and major salivary glands. No cervical lymphadenopathy. Unremarkable thyroid gland. UPPER CHEST: No pneumothorax or pleural effusion. No nodules or masses. AORTIC  ARCH: There is no calcific atherosclerosis of the aortic arch. There is no aneurysm, dissection or hemodynamically significant stenosis of the visualized portion of the aorta. Conventional 3 vessel aortic branching patte

## 2022-01-17 NOTE — ED Triage Notes (Signed)
Pt. Has a 18 g Left AC ?

## 2022-01-17 NOTE — ED Notes (Signed)
Patient transported to CT 

## 2022-01-17 NOTE — ED Notes (Signed)
Pt returned from MRI °

## 2022-01-17 NOTE — ED Triage Notes (Signed)
EMS stated, Sudden onset diaphoresis, nausea, dizziness while at work,  be the tie we got here the symptoms have subsided.  ?

## 2022-01-21 DIAGNOSIS — G4733 Obstructive sleep apnea (adult) (pediatric): Secondary | ICD-10-CM | POA: Diagnosis not present

## 2022-02-21 DIAGNOSIS — G4733 Obstructive sleep apnea (adult) (pediatric): Secondary | ICD-10-CM | POA: Diagnosis not present

## 2022-03-08 DIAGNOSIS — M1711 Unilateral primary osteoarthritis, right knee: Secondary | ICD-10-CM | POA: Diagnosis not present

## 2022-03-15 DIAGNOSIS — M1711 Unilateral primary osteoarthritis, right knee: Secondary | ICD-10-CM | POA: Diagnosis not present

## 2022-03-22 DIAGNOSIS — M25562 Pain in left knee: Secondary | ICD-10-CM | POA: Diagnosis not present

## 2022-03-22 DIAGNOSIS — M170 Bilateral primary osteoarthritis of knee: Secondary | ICD-10-CM | POA: Diagnosis not present

## 2022-03-22 DIAGNOSIS — M1711 Unilateral primary osteoarthritis, right knee: Secondary | ICD-10-CM | POA: Diagnosis not present

## 2022-03-22 DIAGNOSIS — M1712 Unilateral primary osteoarthritis, left knee: Secondary | ICD-10-CM | POA: Diagnosis not present

## 2022-03-23 DIAGNOSIS — G4733 Obstructive sleep apnea (adult) (pediatric): Secondary | ICD-10-CM | POA: Diagnosis not present

## 2022-03-31 DIAGNOSIS — I10 Essential (primary) hypertension: Secondary | ICD-10-CM | POA: Diagnosis not present

## 2022-03-31 DIAGNOSIS — M8949 Other hypertrophic osteoarthropathy, multiple sites: Secondary | ICD-10-CM | POA: Diagnosis not present

## 2022-03-31 DIAGNOSIS — I2510 Atherosclerotic heart disease of native coronary artery without angina pectoris: Secondary | ICD-10-CM | POA: Diagnosis not present

## 2022-03-31 DIAGNOSIS — E7800 Pure hypercholesterolemia, unspecified: Secondary | ICD-10-CM | POA: Diagnosis not present

## 2022-04-20 ENCOUNTER — Ambulatory Visit: Payer: BC Managed Care – PPO | Admitting: Cardiovascular Disease

## 2022-04-20 ENCOUNTER — Encounter: Payer: Self-pay | Admitting: Cardiovascular Disease

## 2022-04-20 DIAGNOSIS — I255 Ischemic cardiomyopathy: Secondary | ICD-10-CM | POA: Diagnosis not present

## 2022-04-20 DIAGNOSIS — E785 Hyperlipidemia, unspecified: Secondary | ICD-10-CM

## 2022-04-20 DIAGNOSIS — G4733 Obstructive sleep apnea (adult) (pediatric): Secondary | ICD-10-CM

## 2022-04-20 DIAGNOSIS — I2102 ST elevation (STEMI) myocardial infarction involving left anterior descending coronary artery: Secondary | ICD-10-CM | POA: Diagnosis not present

## 2022-04-20 DIAGNOSIS — Z6833 Body mass index (BMI) 33.0-33.9, adult: Secondary | ICD-10-CM

## 2022-04-20 DIAGNOSIS — E66811 Obesity, class 1: Secondary | ICD-10-CM

## 2022-04-20 DIAGNOSIS — E661 Drug-induced obesity: Secondary | ICD-10-CM

## 2022-04-20 DIAGNOSIS — I2510 Atherosclerotic heart disease of native coronary artery without angina pectoris: Secondary | ICD-10-CM

## 2022-04-20 NOTE — Patient Instructions (Signed)
   Follow-Up: At [facility], you and your health needs are our priority.  As part of our continuing mission to provide you with exceptional heart care, we have created designated Provider Care Teams.  These Care Teams include your primary Cardiologist (physician) and Advanced Practice Providers (APPs -  Physician Assistants and Nurse Practitioners) who all work together to provide you with the care you need, when you need it.  We recommend signing up for the patient portal called "MyChart".  Sign up information is provided on this After Visit Summary.  MyChart is used to connect with patients for Virtual Visits (Telemedicine).  Patients are able to view lab/test results, encounter notes, upcoming appointments, etc.  Non-urgent messages can be sent to your provider as well.   To learn more about what you can do with MyChart, go to [email or web address].    Your next appointment:   6 month(s)  The format for your next appointment:   In Person  Provider:   Nicki Guadalajara, MD       Important Information About Sugar

## 2022-04-20 NOTE — Progress Notes (Signed)
Cardiology Office Note    Date:  04/20/2022   ID:  Brandon Villanueva., DOB [birth year], MRN [id number]  PCP:  [name], MD  Cardiologist:  [name], MD   67-monthfollow-up office visit  History of Present Illness:  Brandon Villanueva is a 56y.o. male who has a history of hypertension, obstructive sleep apnea on CPAP therapy since December 2015, and history of CAD.  He had undergone a remote evaluation for chest pain in the past.  On May 27, 2021 while cutting the grass he developed substernal chest tightness and significant shortness of breath.  Upon presenting to the emergency room he had marked ST depression inferiorly with ST segment elevation anteriorly.  He was taken emergently to the catheterization laboratory where I performed catheterization.  He was found to have total occlusion of the LAD at its ostium with TIMI 0 flow and no evidence for collaterals.  There was mild narrowing of 25% and a moderate sized bifurcating ramus intermediate vessel.  He had normal left circumflex coronary artery.  An his RCA was a dominant vessel with focal 80% stenosis in the continuation branch just after the PDA takeoff.  He underwent successful PCI of the LAD ostium with ultimate insertion of a 3.5 x 15 mm Onyx frontiers DES stent postdilated to 3.8 mm with stenosis being reduced to 0% and TIMI 0 flow improved to TIMI-3.  Once the LAD was open there was evidence for 60% stenosis at the ostium of the diagonal vessel and diffuse irregularity of the LAD.  Subsequent to his catheterization, echo Doppler study done the following day showed an EF of 30% with no significant valvular abnormalities.  He was started on carvedilol, Entresto 24/26 but apparently developed hypotension and medication was discontinued.  Since his hospitalization he was seen in follow-up on June 03, 2021.  At that time blood pressure was stable and he was started back on carvedilol and low-dose losartan.  He was subsequently  evaluated by [name] NP on June 07, 2021 and he has gradually been progressing his activity.  I saw him for my initial follow-up in the office following his STEMI on October 22, 2021.  At that time he denied any recurrent chest pain and was participating in cardiac rehab.   He was on aspirin and Brilinta, carvedilol 6.25 mg twice daily and losartan 12.5 mg daily.  He also was on rosuvastatin 20 mg for hyperlipidemia.  He continues to use CPAP therapy.  He has requested to transition his sleep care to me.  He last saw Dr. TRadford Paxin a telemedicine visit in June 2021.  He has a ResMed air sense 10 CPAP unit which is set at 11 cm set pressure.  Set up date was October 26, 2014.  Due to the 5G upgrade his machine is no longer wireless.  [employer] is his DME.  He brought his machine with him and with a download was obtained which shows excellent compliance with average use of 7 hours and 7 minutes per night.  At 11 cm AHI is 1.3/h.  During that evaluation, his recent lipid studies were excellent with LDL at 44.  Since his CPAP machine was over 730years old and he was compliant with therapy and qualified for new machine and an order was placed with [employer] is his DME.    [name] a new ResMed AirSense 11 CPAP machine on November 23, 2021.  His CPAP unit is set at 11  cm set pressure and initial download from January 25 through December 22, 2021 showed average use at 7 hours and 12 minutes.  AHI was 2.7.  Subsequent download from May 24 through April 20, 2022 however showed AHI at 7.3 with a central index of 6.5.  Presently, he feels he is sleeping well.  There is no breakthrough snoring.  He typically goes to bed around 830 and wakes up around 4 AM.  He denies any recurrent chest pain or shortness of breath.  He continues to be on carvedilol 6.25 mg twice a day, losartan 12.5 mg daily, rosuvastatin 20 mg and DAPT with aspirin/Brilinta.  He presents for follow-up evaluation.  Past Medical History:  Diagnosis  Date   Arthritis    CAD in native artery    a. STEMI 04/2021 s/p DES to LAD.   Essential hypertension    Hyperlipidemia with target LDL less than 70 02/02/2016   Hypertension    Morbid obesity ([place]) 07/07/2015   OSA (obstructive sleep apnea) 09/28/2015   Severe with AHI 47/hr with successful CPAP titration to 11cm H2O   Recurrent right inguinal hernia s/p lap repair with mesh 06/09/14 06/01/2014    Past Surgical History:  Procedure Laterality Date   CARDIAC CATHETERIZATION     CORONARY STENT INTERVENTION N/A 05/27/2021   Procedure: CORONARY STENT INTERVENTION;  Surgeon: Troy Sine, MD;  Location: [facility];  Service: Cardiovascular;  Laterality: N/A;   CORONARY/GRAFT ACUTE MI REVASCULARIZATION N/A 05/27/2021   Procedure: Coronary/Graft Acute MI Revascularization;  Surgeon: Troy Sine, MD;  Location: [facility];  Service: Cardiovascular;  Laterality: N/A;   INGUINAL HERNIA REPAIR Right 10/30/2008   LEFT HEART CATH AND CORONARY ANGIOGRAPHY N/A 05/27/2021   Procedure: LEFT HEART CATH AND CORONARY ANGIOGRAPHY;  Surgeon: Troy Sine, MD;  Location: [facility];  Service: Cardiovascular;  Laterality: N/A;   VENTRAL HERNIA REPAIR     Repaired twice by Dr. Johnathan Hausen    Current Medications: Outpatient Medications Prior to Visit  Medication Sig Dispense Refill   aspirin EC 81 MG tablet Take 81 mg by mouth daily.     carvedilol (COREG) 6.25 MG tablet TAKE 1 TABLET BY MOUTH 2 TIMES DAILY WITH A MEAL. (Patient taking differently: Take 6.25 mg by mouth 2 (two) times daily with a meal.) 180 tablet 2   erythromycin ophthalmic ointment erythromycin 5 mg/gram (0.5 %) eye ointment     Glucosamine-Chondroitin (COSAMIN DS PO) Take 2 tablets by mouth daily.     losartan (COZAAR) 25 MG tablet Take 0.5 tablets (12.5 mg total) by mouth at bedtime. 45 tablet 3   magnesium oxide (MAG-OX) 400 MG tablet Take 400 mg by mouth every evening.     metroNIDAZOLE (METROGEL) 1  % gel Apply 1 application topically daily as needed (rosacea flares).     Respiratory Therapy Supplies (CARETOUCH 2 CPAP HOSE HANGER) MISC See admin instructions.     rosuvastatin (CRESTOR) 20 MG tablet TAKE 1 TABLET BY MOUTH EVERY DAY (Patient taking differently: Take 20 mg by mouth daily.) 90 tablet 2   ticagrelor (BRILINTA) 90 MG TABS tablet Take 1 tablet (90 mg total) by mouth 2 (two) times daily. 60 tablet 11   traMADol (ULTRAM) 50 MG tablet Take 50 mg by mouth every 6 (six) hours as needed.     nitroGLYCERIN (NITROSTAT) 0.4 MG SL tablet Place 1 tablet (0.4 mg total) under the tongue every 5 (five) minutes as needed for chest  pain (up to 3 doses. If taking 3rd dose call 911). 25 tablet 3   No facility-administered medications prior to visit.     Allergies:   Erythromycin, Lipitor [atorvastatin], and Sumatriptan   Social History   Socioeconomic History   Marital status: Married    Spouse name: Not on file   Number of children: 2   Years of education: 13   Highest education level: High school graduate  Occupational History   Not on file  Tobacco Use   Smoking status: Never   Smokeless tobacco: Never  Vaping Use   Vaping Use: Never used  Substance and Sexual Activity   Alcohol use: Not Currently   Drug use: No   Sexual activity: Yes  Other Topics Concern   Not on file  Social History Narrative   Not on file   Social Determinants of Health   Financial Resource Strain: Low Risk  (06/03/2021)   Overall Financial Resource Strain (CARDIA)    Difficulty of Paying Living Expenses: Not very hard  Food Insecurity: No Food Insecurity (06/03/2021)   Hunger Vital Sign    Worried About Running Out of Food in the Last Year: Never true    Ran Out of Food in the Last Year: Never true  Transportation Needs: No Transportation Needs (06/03/2021)   PRAPARE - Hydrologist (Medical): No    Lack of Transportation (Non-Medical): No  Physical Activity: Not on file   Stress: Not on file  Social Connections: Not on file     Family History:  The patient's family history includes Coronary artery disease in his paternal grandmother; Coronary artery disease (age of onset: 72) in his father; Diabetes in his paternal grandfather; Heart attack in his paternal grandmother; Hyperlipidemia in his father.   ROS General: Negative; No fevers, chills, or night sweats;  HEENT: Negative; No changes in vision or hearing, sinus congestion, difficulty swallowing Pulmonary: Negative; No cough, wheezing, shortness of breath, hemoptysis Cardiovascular: Negative; No chest pain, presyncope, syncope, palpitations GI: Negative; No nausea, vomiting, diarrhea, or abdominal pain GU: Negative; No dysuria, hematuria, or difficulty voiding Musculoskeletal: Negative; no myalgias, joint pain, or weakness Hematologic/Oncology: Negative; no easy bruising, bleeding Endocrine: Negative; no heat/cold intolerance; no diabetes Neuro: Negative; no changes in balance, headaches Skin: Negative; No rashes or skin lesions Psychiatric: Negative; No behavioral problems, depression Sleep: OSA on CPAP therapy since December 2015; no residual snoring, daytime sleepiness, hypersomnolence, bruxism, restless legs, hypnogognic hallucinations, no cataplexy; received a new ResMed AirSense 11 AutoSet CPAP unit on November 23, 2021  An Epworth Sleepiness Scale score was calculated in the office today and this endorsed at 6 arguing against residual daytime sleepiness  Other comprehensive 14 point system review is negative.   PHYSICAL EXAM:   VS:  BP 108/70 (BP Location: Left Arm, Patient Position: Sitting, Cuff Size: Large)   Pulse (!) 51   Ht _0  (1.727 m)   Wt 218 lb (98.9 kg)   SpO2 96%   BMI 33.15 kg/m     Repeat blood pressure was 112/70  Wt Readings from Last 3 Encounters:  04/20/22 218 lb (98.9 kg)  10/20/21 220 lb (99.8 kg)  10/14/21 214 lb 1.1 oz (97.1 kg)    General: Alert, oriented,  no distress.  Skin: normal turgor, no rashes, warm and dry HEENT: Normocephalic, atraumatic. Pupils equal round and reactive to light; sclera anicteric; extraocular muscles intact; Fundi ** Nose without nasal septal hypertrophy Mouth/Parynx benign; Mallinpatti scale 2  Neck: No JVD, no carotid bruits; normal carotid upstroke Lungs: clear to ausculatation and percussion; no wheezing or rales Chest wall: without tenderness to palpitation Heart: PMI not displaced, RRR, s1 s2 normal, 1/6 systolic murmur, no diastolic murmur, no rubs, gallops, thrills, or heaves Abdomen: Moderate diastases recti; soft, nontender; no hepatosplenomehaly, BS+; abdominal aorta nontender and not dilated by palpation. Back: no CVA tenderness Pulses 2+ Musculoskeletal: full range of motion, normal strength, no joint deformities Extremities: no clubbing cyanosis or edema, Homan's sign negative  Neurologic: grossly nonfocal; Cranial nerves grossly wnl Psychologic: Normal mood and affect   Studies/Labs Reviewed:   April 18, 2022 ECG (independently read by me): Sinus bradycardia 51 bpm.  No ectopy.  Normal intervals.  October 22, 2021 ECG (independently read by me):  NSR at 67, no ST changes, no ectopy  Recent Labs:    Latest Ref Rng & Units 01/17/2022   12:51 PM 06/03/2021   11:33 AM 05/29/2021   12:19 PM  BMP  Glucose 70 - 99 mg/dL 149  103  119   BUN 6 - 20 mg/dL _0 Creatinine 0.61 - 1.24 mg/dL 0.79  0.82  0.86   Sodium 135 - 145 mmol/L 138  135  135   Potassium 3.5 - 5.1 mmol/L 4.3  3.9  4.0   Chloride 98 - 111 mmol/L 103  102  103   CO2 22 - 32 mmol/L _1 Calcium 8.9 - 10.3 mg/dL 8.9  9.3  9.1         Latest Ref Rng & Units 07/08/2021    7:41 AM 05/27/2021    4:30 PM 07/24/2018    7:23 AM  Hepatic Function  Total Protein 6.0 - 8.5 g/dL 6.9  7.3    Albumin 3.8 - 4.9 g/dL 4.6  4.3    AST 0 - 40 IU/L 25  35    ALT 0 - 44 IU/L 26  43  35   Alk Phosphatase 44 - 121 IU/L 93  80    Total  Bilirubin 0.0 - 1.2 mg/dL 0.9  1.0    Bilirubin, Direct 0.00 - 0.40 mg/dL 0.29          Latest Ref Rng & Units 01/17/2022   12:51 PM 06/03/2021   11:33 AM 05/29/2021   12:19 PM  CBC  WBC 4.0 - 10.5 K/uL 10.7  6.4  8.4   Hemoglobin 13.0 - 17.0 g/dL 15.0  14.6  15.1   Hematocrit 39.0 - 52.0 % 44.7  41.7  45.6   Platelets 150 - 400 K/uL 206  245  242    Lab Results  Component Value Date   MCV 91.4 01/17/2022   MCV 87.8 06/03/2021   MCV 90.5 05/29/2021   No results found for: "TSH" Lab Results  Component Value Date   HGBA1C 5.7 (H) 05/27/2021     BNP    Component Value Date/Time   BNP 30.4 06/03/2021 1133    ProBNP No results found for: "PROBNP"   Lipid Panel     Component Value Date/Time   CHOL 102 07/08/2021 0741   TRIG 62 07/08/2021 0741   HDL 44 07/08/2021 0741   CHOLHDL 2.3 07/08/2021 0741   CHOLHDL 3.6 05/27/2021 1630   VLDL 17 05/27/2021 1630   LDLCALC 44 07/08/2021 0741   LABVLDL 14 07/08/2021 0741     RADIOLOGY: No results found.   Additional studies/ records that were reviewed  today include:   EMERGENT CATH/PCI: 05/27/2021    Lat 1st Diag lesion is 50% stenosed.   Mid LAD lesion is 50% stenosed.   1st Diag lesion is 60% stenosed.   Mid LAD to Dist LAD lesion is 40% stenosed.   RPAV lesion is 80% stenosed.   Ost LAD to Prox LAD lesion is 100% stenosed.   A drug-eluting stent was successfully placed.   Post intervention, there is a 0% residual stenosis.   Acute ST segment elevation anterior wall myocardial infarction secondary to total occlusion of the LAD at its ostium with TIMI 0 flow and no evidence for collaterals.   Mild narrowing of 25% in the moderate-sized bifurcating ramus intermediate vessel.   Normal left circumflex coronary artery.   Dominant RCA with focal 80% stenosis in the continuation branch just after the PDA takeoff.   Difficult but successful PCI of the LAD ostium with ultimate insertion of a 3.5 x 15 mm Resolute Frontier  DES stent postdilated to 3.80 mm with the100% occlusion being reduced to 0% and TIMI 0 flow improved to TIMI-3 flow.  Once the LAD was open, there is evidence for 60% stenosis at the ostium of the diagonal vessel and diffuse irregularity of 40% in the mid LAD.   Elevated LVEDP at 51mHg.   RECOMMENDATION: DAPT for minimum of 1 year. Medical therapy for concomitant CAD with possible need for intervention to the distal RCA.  A 2D echo Doppler study will be ordered for assessment of LV function with hopeful improvement and salvage of myocardium with time.  Recommend more aggressive lipid therapy and suggest changing pravastatin to rosuvastatin in this patient intolerant to previous atorvastatin.  Plan to initiate carvedilol and possible ARB therapy in a.m. as blood pressure and heart rate allows.    Intervention       ECHO: 05/28/2021 IMPRESSIONS   1. Left ventricular ejection fraction, by estimation, is 30%. The left  ventricle has severely decreased function. The left ventricle demonstrates  regional wall motion abnormalities (suggestive of LAD disease).   2. Right ventricular systolic function is not well visualized but is  grossly normal. The right ventricular size is normal.   3. The mitral valve is grossly normal. No evidence of mitral valve  regurgitation.   4. The aortic valve was not well visualized. Aortic valve regurgitation  is not visualized. No aortic stenosis is present.   5. The inferior vena cava is normal in size with <50% respiratory  variability, suggesting right atrial pressure of 8 mmHg.   Comparison(s): A prior study was performed on 08/22/2017. Decrease in LVEF  with new wall motion abnormalities; technically difficult study.    ECHO: 06/24/2021 IMPRESSIONS   1. Left ventricular ejection fraction, by estimation, is 65 to 70%. The  left ventricle has normal function. The left ventricle has no regional  wall motion abnormalities. Left ventricular diastolic  parameters were  normal.   2. Right ventricular systolic function is normal. The right ventricular  size is normal.   3. The mitral valve is normal in structure. No evidence of mitral valve  regurgitation.   4. The aortic valve is tricuspid. Aortic valve regurgitation is not  visualized.   5. The inferior vena cava is normal in size with greater than 50%  respiratory variability, suggesting right atrial pressure of 3 mmHg.   Comparison(s): The left ventricular function has improved.   ASSESSMENT:    1. ST elevation myocardial infarction involving left anterior descending (LAD) coronary artery (  [place]): 05/27/2021; DES stent to LAD ostium   2. Coronary artery disease involving native coronary artery of native heart without angina pectoris   3. Ischemic cardiomyopathy: resolved   4. OSA (obstructive sleep apnea) on CPAP   5. Hyperlipidemia with target LDL less than 70   6. Class 1 drug-induced obesity with serious comorbidity and body mass index (BMI) of 33.0 to 33.9 in adult     PLAN:  Mr. Lateef Juncaj is a 56 year old gentleman who has a history of hypertension, obstructive sleep apnea, and CAD.  He presented with an acute anterior wall myocardial infarction on May 27, 2021 secondary to ostial occlusion of a large LAD with TIMI 0 flow.  The procedure was difficult but ultimately successful and he underwent insertion of a 3.5 x 15 mm Onyx frontiers DES stent to the LAD ostium postdilated to 3.80 without a percent occlusion being reduced to 0% and TIMI 0 flow improving to TIMI-3 flow.  He had concomitant CAD involving the diagonal vessel and diffuse irregularity with 40% stenosis of the mid LAD.  There also was 80% stenosis in a small caliber continuation branch of the distal RCA after the PDA takeoff.  Initial EF was depressed at 30% and he was initially treated with low-dose Entresto in addition to carvedilol.  Ultimately due to low blood pressure Entresto was discontinued.  A subsequent  echo Doppler study from June 25, 2019 showed complete normalization in salvage myocardium with EF at 65 to 70% regional wall motion abnormalities.  Clinically, he continues to do exceptionally well and has been without recurrent anginal symptomatology or exertional dyspnea.  His blood pressure today is controlled on low-dose losartan 12.5 mg in addition to carvedilol 6.25 mg twice a day.  He continues to be on DAPT with aspirin/Brilinta.  He is tolerating rosuvastatin 20 mg daily for hyperlipidemia.  LDL cholesterol in September 2022 was 44.  He has received a new ResMed AirSense 11 CPAP auto unit.  I reviewed his most recent downloads.  Apparently his CPAP machine was set at his old setting which was a set pressure of 11 cm.  Presently I have recommended we change him to a auto mode with a range of 9 to 15 cm of water.  I will also change his ramp time to 10 minutes and increase his ramp start pressure at 6 cm.  We will plan to obtain a download over the next month for reassessment.  BMI is 33.15.  Weight loss and exercise was recommended.  I will see him in 6 months for reevaluation or sooner as needed.   Medication Adjustments/Labs and Tests Ordered: Current medicines are reviewed at length with the patient today.  Concerns regarding medicines are outlined above.  Medication changes, Labs and Tests ordered today are listed in the Patient Instructions below. Patient Instructions    Follow-Up: At [facility], you and your health needs are our priority.  As part of our continuing mission to provide you with exceptional heart care, we have created designated Provider Care Teams.  These Care Teams include your primary Cardiologist (physician) and Advanced Practice Providers (APPs -  Physician Assistants and Nurse Practitioners) who all work together to provide you with the care you need, when you need it.  We recommend signing up for the patient portal called "MyChart".  Sign up information is provided  on this After Visit Summary.  MyChart is used to connect with patients for Virtual Visits (Telemedicine).  Patients are able to view  lab/test results, encounter notes, upcoming appointments, etc.  Non-urgent messages can be sent to your provider as well.   To learn more about what you can do with MyChart, go to [email or web address].    Your next appointment:   6 month(s)  The format for your next appointment:   In Person  Provider:   [name], MD       Important Information About Sugar         Signed, [name], MD  04/20/2022 6:34 PM    [facility] [address] [place], [place], [place]  [address] Phone: [phone]

## 2022-04-23 DIAGNOSIS — G4733 Obstructive sleep apnea (adult) (pediatric): Secondary | ICD-10-CM | POA: Diagnosis not present

## 2022-05-05 DIAGNOSIS — E7800 Pure hypercholesterolemia, unspecified: Secondary | ICD-10-CM | POA: Diagnosis not present

## 2022-05-05 DIAGNOSIS — Z0000 Encounter for general adult medical examination without abnormal findings: Secondary | ICD-10-CM | POA: Diagnosis not present

## 2022-05-05 DIAGNOSIS — I10 Essential (primary) hypertension: Secondary | ICD-10-CM | POA: Diagnosis not present

## 2022-05-05 DIAGNOSIS — Z125 Encounter for screening for malignant neoplasm of prostate: Secondary | ICD-10-CM | POA: Diagnosis not present

## 2022-05-10 DIAGNOSIS — M1711 Unilateral primary osteoarthritis, right knee: Secondary | ICD-10-CM | POA: Diagnosis not present

## 2022-05-17 DIAGNOSIS — M19071 Primary osteoarthritis, right ankle and foot: Secondary | ICD-10-CM | POA: Diagnosis not present

## 2022-05-17 DIAGNOSIS — M79671 Pain in right foot: Secondary | ICD-10-CM | POA: Diagnosis not present

## 2022-05-19 ENCOUNTER — Other Ambulatory Visit: Payer: Self-pay | Admitting: Physician Assistant

## 2022-05-19 NOTE — Telephone Encounter (Signed)
This is Dr. Kelly's pt. °

## 2022-05-22 ENCOUNTER — Telehealth: Payer: Self-pay | Admitting: Cardiovascular Disease

## 2022-05-22 NOTE — Telephone Encounter (Signed)
*  STAT* If patient is at the pharmacy, call can be transferred to refill team.   1. Which medications need to be refilled? (please list name of each medication and dose if known)   losartan (COZAAR) 25 MG tablet    2. Which pharmacy/location (including street and city if local pharmacy) is medication to be sent to? [id number] - [place], [place] - [address]  3. Do they need a 30 day or 90 day supply?  90 day

## 2022-05-23 DIAGNOSIS — G4733 Obstructive sleep apnea (adult) (pediatric): Secondary | ICD-10-CM | POA: Diagnosis not present

## 2022-05-23 MED ORDER — LOSARTAN POTASSIUM 25 MG PO TABS: 12.5000 mg | ORAL_TABLET | Freq: Every day | ORAL | 3 refills | Status: DC

## 2022-06-05 ENCOUNTER — Telehealth: Payer: Self-pay | Admitting: Cardiovascular Disease

## 2022-06-05 NOTE — Telephone Encounter (Signed)
Pt states his cpap machine is working fine but he is not getting any of the data on his phone or machine

## 2022-06-22 DIAGNOSIS — I10 Essential (primary) hypertension: Secondary | ICD-10-CM | POA: Diagnosis not present

## 2022-06-22 DIAGNOSIS — J019 Acute sinusitis, unspecified: Secondary | ICD-10-CM | POA: Diagnosis not present

## 2022-06-23 DIAGNOSIS — G4733 Obstructive sleep apnea (adult) (pediatric): Secondary | ICD-10-CM | POA: Diagnosis not present

## 2022-07-13 NOTE — Telephone Encounter (Signed)
Deferred to [facility].

## 2022-07-20 DIAGNOSIS — G4733 Obstructive sleep apnea (adult) (pediatric): Secondary | ICD-10-CM | POA: Diagnosis not present

## 2022-07-24 DIAGNOSIS — G4733 Obstructive sleep apnea (adult) (pediatric): Secondary | ICD-10-CM | POA: Diagnosis not present

## 2022-08-03 DIAGNOSIS — M1711 Unilateral primary osteoarthritis, right knee: Secondary | ICD-10-CM | POA: Diagnosis not present

## 2022-08-14 ENCOUNTER — Telehealth: Payer: Self-pay | Admitting: Cardiovascular Disease

## 2022-08-14 MED ORDER — CARVEDILOL 6.25 MG PO TABS: 6.2500 mg | ORAL_TABLET | Freq: Two times a day (BID) | ORAL | 2 refills | Status: DC

## 2022-08-14 MED ORDER — ROSUVASTATIN CALCIUM 20 MG PO TABS: 20.0000 mg | ORAL_TABLET | Freq: Every day | ORAL | 2 refills | Status: DC

## 2022-08-14 NOTE — Telephone Encounter (Signed)
*  STAT* If patient is at the pharmacy, call can be transferred to refill team.   1. Which medications need to be refilled? (please list name of each medication and dose if known) carvedilol (COREG) 6.25 MG tablet rosuvastatin (CRESTOR) 20 MG tablet  2. Which pharmacy/location (including street and city if local pharmacy) is medication to be sent to? [id number] - [place], [place] - [address] AT [place]  3. Do they need a 30 day or 90 day supply? 90 day supply   Patient only has 3 tablets left

## 2022-08-23 DIAGNOSIS — G4733 Obstructive sleep apnea (adult) (pediatric): Secondary | ICD-10-CM | POA: Diagnosis not present

## 2022-09-23 DIAGNOSIS — G4733 Obstructive sleep apnea (adult) (pediatric): Secondary | ICD-10-CM | POA: Diagnosis not present

## 2022-09-27 DIAGNOSIS — J209 Acute bronchitis, unspecified: Secondary | ICD-10-CM | POA: Diagnosis not present

## 2022-10-05 DIAGNOSIS — R053 Chronic cough: Secondary | ICD-10-CM | POA: Diagnosis not present

## 2022-10-11 DIAGNOSIS — M170 Bilateral primary osteoarthritis of knee: Secondary | ICD-10-CM | POA: Diagnosis not present

## 2022-10-13 ENCOUNTER — Ambulatory Visit: Payer: BC Managed Care – PPO | Admitting: Cardiovascular Disease

## 2022-10-17 DIAGNOSIS — H6121 Impacted cerumen, right ear: Secondary | ICD-10-CM | POA: Diagnosis not present

## 2022-10-17 DIAGNOSIS — I10 Essential (primary) hypertension: Secondary | ICD-10-CM | POA: Diagnosis not present

## 2022-10-17 DIAGNOSIS — R059 Cough, unspecified: Secondary | ICD-10-CM | POA: Diagnosis not present

## 2022-10-17 DIAGNOSIS — H6502 Acute serous otitis media, left ear: Secondary | ICD-10-CM | POA: Diagnosis not present

## 2022-10-19 ENCOUNTER — Encounter: Payer: Self-pay | Admitting: Nurse Practitioner

## 2022-10-19 ENCOUNTER — Ambulatory Visit: Payer: BC Managed Care – PPO | Attending: Cardiovascular Disease | Admitting: Nurse Practitioner

## 2022-10-19 VITALS — BP 132/84 | HR 80 | Ht 68.0 in | Wt 223.2 lb

## 2022-10-19 DIAGNOSIS — I2510 Atherosclerotic heart disease of native coronary artery without angina pectoris: Secondary | ICD-10-CM | POA: Diagnosis not present

## 2022-10-19 DIAGNOSIS — G4733 Obstructive sleep apnea (adult) (pediatric): Secondary | ICD-10-CM

## 2022-10-19 DIAGNOSIS — I255 Ischemic cardiomyopathy: Secondary | ICD-10-CM | POA: Diagnosis not present

## 2022-10-19 DIAGNOSIS — E785 Hyperlipidemia, unspecified: Secondary | ICD-10-CM | POA: Diagnosis not present

## 2022-10-19 DIAGNOSIS — I10 Essential (primary) hypertension: Secondary | ICD-10-CM

## 2022-10-19 DIAGNOSIS — E669 Obesity, unspecified: Secondary | ICD-10-CM

## 2022-10-19 NOTE — Progress Notes (Signed)
Office Visit    Patient Name: Brandon Villanueva. Date of Encounter: 10/19/2022  Primary Care Provider:  Johny Blamer, MD Primary Cardiologist:  Nicki Guadalajara, MD  Chief Complaint    56 year old male with a history of CAD s/p DES-LAD in 2022, ICM, hypertension, hyperlipidemia, OSA, and obesity who presents for follow-up related to CAD.  Past Medical History    Past Medical History:  Diagnosis Date   Arthritis    CAD in native artery    a. STEMI 04/2021 s/p DES to LAD.   Essential hypertension    Hyperlipidemia with target LDL less than 70 02/02/2016   Hypertension    Morbid obesity (HCC) 07/07/2015   OSA (obstructive sleep apnea) 09/28/2015   Severe with AHI 47/hr with successful CPAP titration to 11cm H2O   Recurrent right inguinal hernia s/p lap repair with mesh 06/09/14 06/01/2014   Past Surgical History:  Procedure Laterality Date   CARDIAC CATHETERIZATION     CORONARY STENT INTERVENTION N/A 05/27/2021   Procedure: CORONARY STENT INTERVENTION;  Surgeon: Lennette Bihari, MD;  Location: [facility];  Service: Cardiovascular;  Laterality: N/A;   CORONARY/GRAFT ACUTE MI REVASCULARIZATION N/A 05/27/2021   Procedure: Coronary/Graft Acute MI Revascularization;  Surgeon: Lennette Bihari, MD;  Location: [facility];  Service: Cardiovascular;  Laterality: N/A;   INGUINAL HERNIA REPAIR Right 10/30/2008   LEFT HEART CATH AND CORONARY ANGIOGRAPHY N/A 05/27/2021   Procedure: LEFT HEART CATH AND CORONARY ANGIOGRAPHY;  Surgeon: Lennette Bihari, MD;  Location: [facility];  Service: Cardiovascular;  Laterality: N/A;   VENTRAL HERNIA REPAIR     Repaired twice by Dr. Luretha Murphy    Allergies  Allergies  Allergen Reactions   Erythromycin     Other reaction(s): stomach upset   Lipitor [Atorvastatin] Other (See Comments)    Muscle aches   Sumatriptan     Other reaction(s): bodyaches    History of Present Illness    56 year old male with the above past  medical history including CAD s/p DES-LAD in 2022, ICM, hypertension, hyperlipidemia, OSA, and obesity.  He was hospitalized in July 2022 in the setting of STEMI.  Cardiac catheterization revealed total occlusion of the LAD at the ostium s/p DES, 80% RPAV lesion, 50% lateral first diagonal, 50% mid LAD, and 60% first diagonal. Echocardiogram the time revealed EF 30%, severely decreased LV function, RWMA consistent with LAD disease, normal RV systolic function, no significant valvular abnormalities. He did not tolerate Entresto due to low BP.  Echocardiogram in 05/2021 revealed EF 65 to 70%, normal RV function, no RWMA, normal RV systolic function, no significant valvular abnormalities. He was last seen in the office on 04/20/2022 and was stable from a cardiac standpoint.  He denied symptoms concerning for angina.  He presents today for follow-up.  Since his last visit he has done well from a cardiac standpoint. He denies any symptoms concerning for angina. He is active in his work. He denies any dyspnea, edema, pnd, orthopnea, weight gain. Overall, he reports feeling well.   Home Medications    Current Outpatient Medications  Medication Sig Dispense Refill   aspirin EC 81 MG tablet Take 81 mg by mouth daily.     carvedilol (COREG) 6.25 MG tablet Take 1 tablet (6.25 mg total) by mouth 2 (two) times daily with a meal. 180 tablet 2   erythromycin ophthalmic ointment erythromycin 5 mg/gram (0.5 %) eye ointment     Glucosamine-Chondroitin (COSAMIN DS PO)  Take 2 tablets by mouth daily.     losartan (COZAAR) 25 MG tablet Take 0.5 tablets (12.5 mg total) by mouth at bedtime. 45 tablet 3   magnesium oxide (MAG-OX) 400 MG tablet Take 400 mg by mouth every evening.     metroNIDAZOLE (METROGEL) 1 % gel Apply 1 application topically daily as needed (rosacea flares).     nitroGLYCERIN (NITROSTAT) 0.4 MG SL tablet Place 1 tablet (0.4 mg total) under the tongue every 5 (five) minutes as needed for chest pain (up to 3  doses. If taking 3rd dose call 911). 25 tablet 3   Respiratory Therapy Supplies (CARETOUCH 2 CPAP HOSE HANGER) MISC See admin instructions.     rosuvastatin (CRESTOR) 20 MG tablet Take 1 tablet (20 mg total) by mouth daily. 90 tablet 2   ticagrelor (BRILINTA) 90 MG TABS tablet Take 1 tablet (90 mg total) by mouth 2 (two) times daily. 60 tablet 8   traMADol (ULTRAM) 50 MG tablet Take 50 mg by mouth every 6 (six) hours as needed.     No current facility-administered medications for this visit.     Review of Systems    He denies chest pain, palpitations, dyspnea, pnd, orthopnea, n, v, dizziness, syncope, edema, weight gain, or early satiety. All other systems reviewed and are otherwise negative except as noted above.   Physical Exam    VS:  BP 132/84   Pulse 80   Ht 5\' 8"  (1.727 m)   Wt 223 lb 3.2 oz (101.2 kg)   SpO2 95%   BMI 33.94 kg/m  GEN: Well nourished, well developed, in no acute distress. HEENT: normal. Neck: Supple, no JVD, carotid bruits, or masses. Cardiac: RRR, no murmurs, rubs, or gallops. No clubbing, cyanosis, edema.  Radials/DP/PT 2+ and equal bilaterally.  Respiratory:  Respirations regular and unlabored, clear to auscultation bilaterally. GI: Soft, nontender, nondistended, BS + x 4. MS: no deformity or atrophy. Skin: warm and dry, no rash. Neuro:  Strength and sensation are intact. Psych: Normal affect.  Accessory Clinical Findings    ECG personally reviewed by me today -NSR, 80 bpm- no acute changes.   Lab Results  Component Value Date   WBC 10.7 (H) 01/17/2022   HGB 15.0 01/17/2022   HCT 44.7 01/17/2022   MCV 91.4 01/17/2022   PLT 206 01/17/2022   Lab Results  Component Value Date   CREATININE 0.79 01/17/2022   BUN 15 01/17/2022   NA 138 01/17/2022   K 4.3 01/17/2022   CL 103 01/17/2022   CO2 25 01/17/2022   Lab Results  Component Value Date   ALT 26 07/08/2021   AST 25 07/08/2021   ALKPHOS 93 07/08/2021   BILITOT 0.9 07/08/2021   Lab  Results  Component Value Date   CHOL 102 07/08/2021   HDL 44 07/08/2021   LDLCALC 44 07/08/2021   TRIG 62 07/08/2021   CHOLHDL 2.3 07/08/2021    Lab Results  Component Value Date   HGBA1C 5.7 (H) 05/27/2021    Assessment & Plan    1. CAD: S/p STEMI, DES-LAD in 2022. Stable with no anginal symptoms. No indication for ischemic evaluation.  He has a history of arthritis and has historically taken meloxicam for pain.  States he was advised by his orthopedic doctor to stop meloxicam while taking aspirin and Brilinta.  Patient is greater than 1 year post PCI.  He is asking if he can stop taking Brilinta so that he may start taking meloxicam again.  I will reach out to Dr. Tresa Endo for recommendations.  For now, continue aspirin, Brilinta, carvedilol, losartan, and Crestor.  2. ICM: EF was 38% at the time of STEMI.  Repeat echo in 05/2021 revealed EF 65 to 70%, normal RV function, no RWMA, normal RV systolic function, no significant valvular abnormalities. Euvolemic and well compensated on exam. Continue current medications as above  3. Hypertension: BP well controlled. Continue current antihypertensive regimen.   4. Hyperlipidemia: LDL was 39 in 04/2022. Continue Crestor.   5. OSA: Adherent to CPAP.   6. Obesity: Encouraged ongoing lifestyle modifications with diet and exercise.   7. Disposition: Follow-up in 6 months.      Joylene Grapes, NP 10/19/2022, 2:13 PM

## 2022-10-19 NOTE — Patient Instructions (Signed)
Medication Instructions:  Your physician recommends that you continue on your current medications as directed. Please refer to the Current Medication list given to you today.  *If you need a refill on your cardiac medications before your next appointment, please call your pharmacy*  Lab Work: NONE ordered at this time of appointment   If you have labs (blood work) drawn today and your tests are completely normal, you will receive your results only by: MyChart Message (if you have MyChart) OR A paper copy in the mail If you have any lab test that is abnormal or we need to change your treatment, we will call you to review the results.  Testing/Procedures: NONE ordered at this time of appointment   Follow-Up: At [facility], you and your health needs are our priority.  As part of our continuing mission to provide you with exceptional heart care, we have created designated Provider Care Teams.  These Care Teams include your primary Cardiologist (physician) and Advanced Practice Providers (APPs -  Physician Assistants and Nurse Practitioners) who all work together to provide you with the care you need, when you need it.  We recommend signing up for the patient portal called "MyChart".  Sign up information is provided on this After Visit Summary.  MyChart is used to connect with patients for Virtual Visits (Telemedicine).  Patients are able to view lab/test results, encounter notes, upcoming appointments, etc.  Non-urgent messages can be sent to your provider as well.   To learn more about what you can do with MyChart, go to [email or web address].    Your next appointment:   6 month(s)  The format for your next appointment:   In Person  Provider:   Thomas Kelly, MD     Other Instructions  Important Information About Sugar       

## 2022-10-26 DIAGNOSIS — G4733 Obstructive sleep apnea (adult) (pediatric): Secondary | ICD-10-CM | POA: Diagnosis not present

## 2022-11-26 DIAGNOSIS — G4733 Obstructive sleep apnea (adult) (pediatric): Secondary | ICD-10-CM | POA: Diagnosis not present

## 2022-12-03 DIAGNOSIS — R1032 Left lower quadrant pain: Secondary | ICD-10-CM | POA: Diagnosis not present

## 2022-12-05 DIAGNOSIS — K5732 Diverticulitis of large intestine without perforation or abscess without bleeding: Secondary | ICD-10-CM | POA: Diagnosis not present

## 2022-12-12 DIAGNOSIS — M8949 Other hypertrophic osteoarthropathy, multiple sites: Secondary | ICD-10-CM | POA: Diagnosis not present

## 2022-12-12 DIAGNOSIS — I10 Essential (primary) hypertension: Secondary | ICD-10-CM | POA: Diagnosis not present

## 2022-12-12 DIAGNOSIS — I2510 Atherosclerotic heart disease of native coronary artery without angina pectoris: Secondary | ICD-10-CM | POA: Diagnosis not present

## 2022-12-12 DIAGNOSIS — E7800 Pure hypercholesterolemia, unspecified: Secondary | ICD-10-CM | POA: Diagnosis not present

## 2022-12-13 ENCOUNTER — Other Ambulatory Visit (HOSPITAL_COMMUNITY): Payer: Self-pay | Admitting: Family Medicine

## 2022-12-13 DIAGNOSIS — R937 Abnormal findings on diagnostic imaging of other parts of musculoskeletal system: Secondary | ICD-10-CM

## 2022-12-19 DIAGNOSIS — R0989 Other specified symptoms and signs involving the circulatory and respiratory systems: Secondary | ICD-10-CM | POA: Diagnosis not present

## 2022-12-19 DIAGNOSIS — K219 Gastro-esophageal reflux disease without esophagitis: Secondary | ICD-10-CM | POA: Diagnosis not present

## 2022-12-27 ENCOUNTER — Encounter (HOSPITAL_COMMUNITY)
Admission: RE | Admit: 2022-12-27 | Discharge: 2022-12-27 | Disposition: A | Discharge status: Home / Self Care | Payer: BC Managed Care – PPO | Source: Ambulatory Visit | Attending: Family Medicine | Admitting: Family Medicine

## 2022-12-27 DIAGNOSIS — R937 Abnormal findings on diagnostic imaging of other parts of musculoskeletal system: Secondary | ICD-10-CM | POA: Diagnosis not present

## 2022-12-27 DIAGNOSIS — G4733 Obstructive sleep apnea (adult) (pediatric): Secondary | ICD-10-CM | POA: Diagnosis not present

## 2022-12-27 DIAGNOSIS — M881 Osteitis deformans of vertebrae: Secondary | ICD-10-CM | POA: Diagnosis not present

## 2022-12-27 MED ORDER — TECHNETIUM TC 99M MEDRONATE IV KIT
20.0000 | PACK | Freq: Once | INTRAVENOUS | Status: AC | PRN
  Administered 2022-12-27: 20 via INTRAVENOUS

## 2023-01-15 DIAGNOSIS — M1711 Unilateral primary osteoarthritis, right knee: Secondary | ICD-10-CM | POA: Diagnosis not present

## 2023-02-04 ENCOUNTER — Other Ambulatory Visit: Payer: Self-pay | Admitting: Cardiovascular Disease

## 2023-02-19 DIAGNOSIS — M1711 Unilateral primary osteoarthritis, right knee: Secondary | ICD-10-CM | POA: Diagnosis not present

## 2023-02-26 DIAGNOSIS — M1711 Unilateral primary osteoarthritis, right knee: Secondary | ICD-10-CM | POA: Diagnosis not present

## 2023-02-26 DIAGNOSIS — M889 Osteitis deformans of unspecified bone: Secondary | ICD-10-CM | POA: Diagnosis not present

## 2023-03-05 DIAGNOSIS — M1711 Unilateral primary osteoarthritis, right knee: Secondary | ICD-10-CM | POA: Diagnosis not present

## 2023-03-07 DIAGNOSIS — M889 Osteitis deformans of unspecified bone: Secondary | ICD-10-CM | POA: Diagnosis not present

## 2023-03-07 DIAGNOSIS — M160 Bilateral primary osteoarthritis of hip: Secondary | ICD-10-CM | POA: Diagnosis not present

## 2023-03-07 DIAGNOSIS — Z0389 Encounter for observation for other suspected diseases and conditions ruled out: Secondary | ICD-10-CM | POA: Diagnosis not present

## 2023-04-06 ENCOUNTER — Other Ambulatory Visit: Payer: Self-pay | Admitting: Cardiovascular Disease

## 2023-04-09 DIAGNOSIS — M889 Osteitis deformans of unspecified bone: Secondary | ICD-10-CM | POA: Diagnosis not present

## 2023-04-16 DIAGNOSIS — M889 Osteitis deformans of unspecified bone: Secondary | ICD-10-CM | POA: Diagnosis not present

## 2023-04-20 ENCOUNTER — Ambulatory Visit: Payer: BC Managed Care – PPO | Attending: Cardiovascular Disease | Admitting: Cardiovascular Disease

## 2023-04-20 DIAGNOSIS — E785 Hyperlipidemia, unspecified: Secondary | ICD-10-CM

## 2023-04-20 DIAGNOSIS — E669 Obesity, unspecified: Secondary | ICD-10-CM

## 2023-04-20 DIAGNOSIS — I2102 ST elevation (STEMI) myocardial infarction involving left anterior descending coronary artery: Secondary | ICD-10-CM | POA: Diagnosis not present

## 2023-04-20 DIAGNOSIS — I2510 Atherosclerotic heart disease of native coronary artery without angina pectoris: Secondary | ICD-10-CM

## 2023-04-20 DIAGNOSIS — G4733 Obstructive sleep apnea (adult) (pediatric): Secondary | ICD-10-CM

## 2023-04-20 DIAGNOSIS — M889 Osteitis deformans of unspecified bone: Secondary | ICD-10-CM

## 2023-04-20 DIAGNOSIS — I10 Essential (primary) hypertension: Secondary | ICD-10-CM

## 2023-04-20 NOTE — Progress Notes (Unsigned)
Cardiology Office Note    Date:  04/21/2023   ID:  Brandon Pu., DOB February 08, 1966, MRN [id number]  PCP:  Noberto Retort, MD  Cardiologist:  Nicki Guadalajara, MD   12 month follow-up office visit  History of Present Illness:  Brandon Gomes. is a 57 y.o. male who has a history of hypertension, obstructive sleep apnea on CPAP therapy since December 2015, and history of CAD.  On May 27, 2021 while cutting the grass he developed substernal chest tightness and significant shortness of breath.  Upon presenting to the emergency room he had marked ST depression inferiorly with ST segment elevation anteriorly.  He was taken emergently to the catheterization laboratory where I performed catheterization.  He was found to have total occlusion of the LAD at its ostium with TIMI 0 flow and no evidence for collaterals.  There was mild narrowing of 25% and a moderate sized bifurcating ramus intermediate vessel.  He had normal left circumflex coronary artery.  An his RCA was a dominant vessel with focal 80% stenosis in the continuation branch just after the PDA takeoff.  He underwent successful PCI of the LAD ostium with ultimate insertion of a 3.5 x 15 mm Onyx frontiers DES stent postdilated to 3.8 mm with stenosis being reduced to 0% and TIMI 0 flow improved to TIMI-3.  Once the LAD was open there was evidence for 60% stenosis at the ostium of the diagonal vessel and diffuse irregularity of the LAD.  Subsequent to his catheterization, echo Doppler study done the following day showed an EF of 30% with no significant valvular abnormalities.  He was started on carvedilol, Entresto 24/26 but apparently developed hypotension and medication was discontinued.  Since his hospitalization he was seen in follow-up on June 03, 2021.  At that time blood pressure was stable and he was started back on carvedilol and low-dose losartan.  He was subsequently evaluated by Edd Fabian, NP on June 07, 2021 and he has  gradually been progressing his activity.  I saw him for my initial follow-up in the office following his STEMI on October 22, 2021.  At that time he denied any recurrent chest pain and was participating in cardiac rehab.   He was on aspirin and Brilinta, carvedilol 6.25 mg twice daily and losartan 12.5 mg daily.  He also was on rosuvastatin 20 mg for hyperlipidemia.  He continues to use CPAP therapy.  He has requested to transition his sleep care to me.  He last saw Dr. Mayford Knife in a telemedicine visit in June 2021.  He has a ResMed air sense 10 CPAP unit which is set at 11 cm set pressure.  Set up date was October 26, 2014.  Due to the 5G upgrade his machine is no longer wireless.  [employer] is his DME.  He brought his machine with him and with a download was obtained which shows excellent compliance with average use of 7 hours and 7 minutes per night.  At 11 cm AHI is 1.3/h.  During that evaluation, his recent lipid studies were excellent with LDL at 44.  Since his CPAP machine was over 51 years old and he was compliant with therapy and qualified for new machine and an order was placed with [employer] is his DME.    I last saw him on April 20, 2022.  Brandon Villanueva received a new ResMed AirSense 11 CPAP machine on November 23, 2021.  His CPAP unit is  set at 11 cm set pressure and initial download from January 25 through December 22, 2021 showed average use at 7 hours and 12 minutes.  AHI was 2.7.  Subsequent download from May 24 through April 20, 2022 however showed AHI at 7.3 with a central index of 6.5.  Presently, he feels he is sleeping well.  There is no breakthrough snoring.  He typically goes to bed around 830 and wakes up around 4 AM.  He denies any recurrent chest pain or shortness of breath.  He continues to be on carvedilol 6.25 mg twice a day, losartan 12.5 mg daily, rosuvastatin 20 mg and DAPT with aspirin/Brilinta.   Since I last saw him, he was evaluated by Bernadene Person, NP in December 2023.  His last echo in  August 2022 showed normalization of LV function with a EF at 65 to 70%.  Presently Brandon Villanueva denies any chest pain or significant shortness of breath.  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  He does have some joint discomfort.  He was recently diagnosed with Paget's disease after he was found to have mass in his right hip and shoulder.  He is followed by Dr. Johny Blamer who will be checking laboratory next week.  He continues to be on aspirin/Brilinta, carvedilol 6.25 mg twice a day, low-dose losartan 12.5 mg at bedtime and rosuvastatin 20 mg daily.  He continues to use CPAP.  A download from May 22 through April 19, 2023 shows 100% compliance with average use at 6 hours and 56 minutes.  His CPAP is set at a pressure range of 9 to 15 cm of water.  AHI is 9.7 then he did have central index of 7.2.  The patient states many nights his AHI on his machine is in the 2-3 range.  However he has had some bad sinus problems.  He has intermittently been using saline flushing and notes that when he uses the saline flush his AHI is improved but at times where he has severe nasal congestion and does not use it his AHI may significantly increase.  He feels better on therapy.  He presents for evaluation.  Past Medical History:  Diagnosis Date   Arthritis    CAD in native artery    a. STEMI 04/2021 s/p DES to LAD.   Essential hypertension    Hyperlipidemia with target LDL less than 70 02/02/2016   Hypertension    Morbid obesity (HCC) 07/07/2015   OSA (obstructive sleep apnea) 09/28/2015   Severe with AHI 47/hr with successful CPAP titration to 11cm H2O   Recurrent right inguinal hernia s/p lap repair with mesh 06/09/14 06/01/2014    Past Surgical History:  Procedure Laterality Date   CARDIAC CATHETERIZATION     CORONARY STENT INTERVENTION N/A 05/27/2021   Procedure: CORONARY STENT INTERVENTION;  Surgeon: Lennette Bihari, MD;  Location: [facility];  Service: Cardiovascular;  Laterality: N/A;    CORONARY/GRAFT ACUTE MI REVASCULARIZATION N/A 05/27/2021   Procedure: Coronary/Graft Acute MI Revascularization;  Surgeon: Lennette Bihari, MD;  Location: [facility];  Service: Cardiovascular;  Laterality: N/A;   INGUINAL HERNIA REPAIR Right 10/30/2008   LEFT HEART CATH AND CORONARY ANGIOGRAPHY N/A 05/27/2021   Procedure: LEFT HEART CATH AND CORONARY ANGIOGRAPHY;  Surgeon: Lennette Bihari, MD;  Location: [facility];  Service: Cardiovascular;  Laterality: N/A;   VENTRAL HERNIA REPAIR     Repaired twice by Dr. Luretha Murphy    Current Medications:  Outpatient Medications Prior to Visit  Medication Sig Dispense Refill   aspirin EC 81 MG tablet Take 81 mg by mouth daily.     BRILINTA 90 MG TABS tablet TAKE 1 TABLET BY MOUTH 2 TIMES DAILY. 60 tablet 8   carvedilol (COREG) 6.25 MG tablet TAKE 1 TABLET BY MOUTH 2 TIMES DAILY WITH A MEAL. 180 tablet 1   erythromycin ophthalmic ointment erythromycin 5 mg/gram (0.5 %) eye ointment     Glucosamine-Chondroitin (COSAMIN DS PO) Take 2 tablets by mouth daily.     losartan (COZAAR) 25 MG tablet TAKE 0.5 TABLETS BY MOUTH AT BEDTIME. 45 tablet 1   magnesium oxide (MAG-OX) 400 MG tablet Take 400 mg by mouth every evening.     metroNIDAZOLE (METROGEL) 1 % gel Apply 1 application topically daily as needed (rosacea flares).     nitroGLYCERIN (NITROSTAT) 0.4 MG SL tablet Place 1 tablet (0.4 mg total) under the tongue every 5 (five) minutes as needed for chest pain (up to 3 doses. If taking 3rd dose call 911). 25 tablet 3   Respiratory Therapy Supplies (CARETOUCH 2 CPAP HOSE HANGER) MISC See admin instructions.     rosuvastatin (CRESTOR) 20 MG tablet TAKE 1 TABLET BY MOUTH EVERY DAY 90 tablet 1   traMADol (ULTRAM) 50 MG tablet Take 50 mg by mouth every 6 (six) hours as needed.     No facility-administered medications prior to visit.     Allergies:   Erythromycin, Lipitor [atorvastatin], and Sumatriptan   Social History   Socioeconomic History    Marital status: Married    Spouse name: Not on file   Number of children: 2   Years of education: 13   Highest education level: High school graduate  Occupational History   Not on file  Tobacco Use   Smoking status: Never   Smokeless tobacco: Never  Vaping Use   Vaping Use: Never used  Substance and Sexual Activity   Alcohol use: Not Currently   Drug use: No   Sexual activity: Yes  Other Topics Concern   Not on file  Social History Narrative   Not on file   Social Determinants of Health   Financial Resource Strain: Low Risk  (06/03/2021)   Overall Financial Resource Strain (CARDIA)    Difficulty of Paying Living Expenses: Not very hard  Food Insecurity: No Food Insecurity (06/03/2021)   Hunger Vital Sign    Worried About Running Out of Food in the Last Year: Never true    Ran Out of Food in the Last Year: Never true  Transportation Needs: No Transportation Needs (06/03/2021)   PRAPARE - Administrator, Civil Service (Medical): No    Lack of Transportation (Non-Medical): No  Physical Activity: Not on file  Stress: Not on file  Social Connections: Not on file     Family History:  The patient's family history includes Coronary artery disease in his paternal grandmother; Coronary artery disease (age of onset: 38) in his father; Diabetes in his paternal grandfather; Heart attack in his paternal grandmother; Hyperlipidemia in his father.   ROS General: Negative; No fevers, chills, or night sweats;  HEENT: Negative; No changes in vision or hearing, sinus congestion, difficulty swallowing Pulmonary: Negative; No cough, wheezing, shortness of breath, hemoptysis Cardiovascular: Negative; No chest pain, presyncope, syncope, palpitations GI: Negative; No nausea, vomiting, diarrhea, or abdominal pain GU: Negative; No dysuria, hematuria, or difficulty voiding Musculoskeletal: Negative; no myalgias, joint pain, or weakness Hematologic/Oncology: Negative; no easy bruising,  bleeding Endocrine: Negative; no heat/cold intolerance; no diabetes Neuro: Negative; no changes in balance, headaches Skin: Negative; No rashes or skin lesions Psychiatric: Negative; No behavioral problems, depression Sleep: OSA on CPAP therapy since December 2015; no residual snoring, daytime sleepiness, hypersomnolence, bruxism, restless legs, hypnogognic hallucinations, no cataplexy; received a new ResMed AirSense 11 AutoSet CPAP unit on November 23, 2021  Other comprehensive 14 point system review is negative.   PHYSICAL EXAM:   VS:  BP 130/74   Pulse 75   Ht 5\' 8"  (1.727 m)   Wt 216 lb 12.8 oz (98.3 kg)   SpO2 95%   BMI 32.96 kg/m     Repeat blood pressure was 126/76  Wt Readings from Last 3 Encounters:  04/20/23 216 lb 12.8 oz (98.3 kg)  10/19/22 223 lb 3.2 oz (101.2 kg)  04/20/22 218 lb (98.9 kg)    General: Alert, oriented, no distress.  Skin: normal turgor, no rashes, warm and dry HEENT: Normocephalic, atraumatic. Pupils equal round and reactive to light; sclera anicteric; extraocular muscles intact;  Nose without nasal septal hypertrophy Mouth/Parynx benign; Mallinpatti scale 3 Neck: No JVD, no carotid bruits; normal carotid upstroke Lungs: clear to ausculatation and percussion; no wheezing or rales Chest wall: without tenderness to palpitation Heart: PMI not displaced, RRR, s1 s2 normal, 1/6 systolic murmur, no diastolic murmur, no rubs, gallops, thrills, or heaves Abdomen: soft, nontender; no hepatosplenomehaly, BS+; abdominal aorta nontender and not dilated by palpation. Back: no CVA tenderness Pulses 2+ Musculoskeletal: full range of motion, normal strength, no joint deformities Extremities: no clubbing cyanosis or edema, Homan's sign negative  Neurologic: grossly nonfocal; Cranial nerves grossly wnl Psychologic: Normal mood and affect    Studies/Labs Reviewed:   EKG Interpretation  Date/Time:  Friday April 20 2023 15:46:13 EDT Ventricular Rate:  65 PR  Interval:  142 QRS Duration: 92 QT Interval:  398 QTC Calculation: 413 R Axis:   65 Text Interpretation: Normal sinus rhythm Normal ECG When compared with ECG of 17-Jan-2022 12:07, No significant change was found Confirmed by Nicki Guadalajara ([id number]) on 04/20/2023 4:26:13 PM    April 18, 2022 ECG (independently read by me): Sinus bradycardia 51 bpm.  No ectopy.  Normal intervals.  October 22, 2021 ECG (independently read by me):  NSR at 67, no ST changes, no ectopy  Recent Labs:    Latest Ref Rng & Units 01/17/2022   12:51 PM 06/03/2021   11:33 AM 05/29/2021   12:19 PM  BMP  Glucose 70 - 99 mg/dL 366  440  347   BUN 6 - 20 mg/dL 15  14  12    Creatinine 0.61 - 1.24 mg/dL 4.25  9.56  3.87   Sodium 135 - 145 mmol/L 138  135  135   Potassium 3.5 - 5.1 mmol/L 4.3  3.9  4.0   Chloride 98 - 111 mmol/L 103  102  103   CO2 22 - 32 mmol/L 25  26  25    Calcium 8.9 - 10.3 mg/dL 8.9  9.3  9.1         Latest Ref Rng & Units 07/08/2021    7:41 AM 05/27/2021    4:30 PM 07/24/2018    7:23 AM  Hepatic Function  Total Protein 6.0 - 8.5 g/dL 6.9  7.3    Albumin 3.8 - 4.9 g/dL 4.6  4.3    AST 0 - 40 IU/L 25  35    ALT 0 - 44 IU/L 26  43  35  Alk Phosphatase 44 - 121 IU/L 93  80    Total Bilirubin 0.0 - 1.2 mg/dL 0.9  1.0    Bilirubin, Direct 0.00 - 0.40 mg/dL 8.65          Latest Ref Rng & Units 01/17/2022   12:51 PM 06/03/2021   11:33 AM 05/29/2021   12:19 PM  CBC  WBC 4.0 - 10.5 K/uL 10.7  6.4  8.4   Hemoglobin 13.0 - 17.0 g/dL 78.4  69.6  29.5   Hematocrit 39.0 - 52.0 % 44.7  41.7  45.6   Platelets 150 - 400 K/uL 206  245  242    Lab Results  Component Value Date   MCV 91.4 01/17/2022   MCV 87.8 06/03/2021   MCV 90.5 05/29/2021   No results found for: "TSH" Lab Results  Component Value Date   HGBA1C 5.7 (H) 05/27/2021     BNP    Component Value Date/Time   BNP 30.4 06/03/2021 1133    ProBNP No results found for: "PROBNP"   Lipid Panel     Component Value Date/Time    CHOL 102 07/08/2021 0741   TRIG 62 07/08/2021 0741   HDL 44 07/08/2021 0741   CHOLHDL 2.3 07/08/2021 0741   CHOLHDL 3.6 05/27/2021 1630   VLDL 17 05/27/2021 1630   LDLCALC 44 07/08/2021 0741   LABVLDL 14 07/08/2021 0741     RADIOLOGY: No results found.   Additional studies/ records that were reviewed today include:   EMERGENT CATH/PCI: 05/27/2021    Lat 1st Diag lesion is 50% stenosed.   Mid LAD lesion is 50% stenosed.   1st Diag lesion is 60% stenosed.   Mid LAD to Dist LAD lesion is 40% stenosed.   RPAV lesion is 80% stenosed.   Ost LAD to Prox LAD lesion is 100% stenosed.   A drug-eluting stent was successfully placed.   Post intervention, there is a 0% residual stenosis.   Acute ST segment elevation anterior wall myocardial infarction secondary to total occlusion of the LAD at its ostium with TIMI 0 flow and no evidence for collaterals.   Mild narrowing of 25% in the moderate-sized bifurcating ramus intermediate vessel.   Normal left circumflex coronary artery.   Dominant RCA with focal 80% stenosis in the continuation branch just after the PDA takeoff.   Difficult but successful PCI of the LAD ostium with ultimate insertion of a 3.5 x 15 mm Resolute Frontier DES stent postdilated to 3.80 mm with the100% occlusion being reduced to 0% and TIMI 0 flow improved to TIMI-3 flow.  Once the LAD was open, there is evidence for 60% stenosis at the ostium of the diagonal vessel and diffuse irregularity of 40% in the mid LAD.   Elevated LVEDP at .   RECOMMENDATION: DAPT for minimum of 1 year. Medical therapy for concomitant CAD with possible need for intervention to the distal RCA.  A 2D echo Doppler study will be ordered for assessment of LV function with hopeful improvement and salvage of myocardium with time.  Recommend more aggressive lipid therapy and suggest changing pravastatin to rosuvastatin in this patient intolerant to previous atorvastatin.  Plan to initiate  carvedilol and possible ARB therapy in a.m. as blood pressure and heart rate allows.    Intervention       ECHO: 05/28/2021 IMPRESSIONS   1. Left ventricular ejection fraction, by estimation, is 30%. The left  ventricle has severely decreased function. The left ventricle demonstrates  regional wall motion  abnormalities (suggestive of LAD disease).   2. Right ventricular systolic function is not well visualized but is  grossly normal. The right ventricular size is normal.   3. The mitral valve is grossly normal. No evidence of mitral valve  regurgitation.   4. The aortic valve was not well visualized. Aortic valve regurgitation  is not visualized. No aortic stenosis is present.   5. The inferior vena cava is normal in size with <50% respiratory  variability, suggesting right atrial pressure of 8 mmHg.   Comparison(s): A prior study was performed on 08/22/2017. Decrease in LVEF  with new wall motion abnormalities; technically difficult study.    ECHO: 06/24/2021 IMPRESSIONS   1. Left ventricular ejection fraction, by estimation, is 65 to 70%. The  left ventricle has normal function. The left ventricle has no regional  wall motion abnormalities. Left ventricular diastolic parameters were  normal.   2. Right ventricular systolic function is normal. The right ventricular  size is normal.   3. The mitral valve is normal in structure. No evidence of mitral valve  regurgitation.   4. The aortic valve is tricuspid. Aortic valve regurgitation is not  visualized.   5. The inferior vena cava is normal in size with greater than 50%  respiratory variability, suggesting right atrial pressure of 3 mmHg.   Comparison(s): The left ventricular function has improved.   ASSESSMENT:    1. ST elevation myocardial infarction involving left anterior descending (LAD) coronary artery (HCC): 05/27/2021; DES stent to LAD ostium   2. Coronary artery disease involving native coronary artery of native  heart without angina pectoris   3. OSA (obstructive sleep apnea)   4. Hyperlipidemia LDL goal <70   5. Essential hypertension   6. Obesity (BMI 30-39.9)   7. Paget's disease of bone     PLAN:  Brandon Villanueva is a 57 year old gentleman who has a history of hypertension, obstructive sleep apnea, and CAD.  He presented with an acute anterior wall myocardial infarction on May 27, 2021 secondary to ostial occlusion of a large LAD with TIMI 0 flow.  The procedure was difficult but ultimately successful and he underwent insertion of a 3.5 x 15 mm Onyx frontiers DES stent to the LAD ostium postdilated to 3.80 without a percent occlusion being reduced to 0% and TIMI 0 flow improving to TIMI-3 flow.  He had concomitant CAD involving the diagonal vessel and diffuse irregularity with 40% stenosis of the mid LAD.  There also was 80% stenosis in a small caliber continuation branch of the distal RCA after the PDA takeoff.  Initial EF was depressed at 30% and he was initially treated with low-dose Entresto in addition to carvedilol.  Ultimately due to low blood pressure Entresto was discontinued.  A subsequent echo Doppler study from June 25, 2019 showed complete normalization in salvage myocardium with EF at 65 to 70% regional wall motion abnormalities.  From a cardiac standpoint he continues to be stable and remains on a medical regimen of DAPT with aspirin/Brilinta, carvedilol 6.25 mg twice a day, and low-dose losartan 12.5 mg daily.  Blood pressure today is stable.  His stent is at the ostium of his LAD.  For this reason I would recommend long-term DAPT.  He continues to be on rosuvastatin for hyperlipidemia.  He has had issues with significant nasal congestion which may be contributing to variable AHI noted.  His most recent download shows an AHI of 9.7 in the has central index of 7.2.  He states many nights AHI may be 2-3.  As result I will not change his settings today.  However, if he continues to have  significant difficulty and has elevated central index repeat titration with BiPAP may be necessary in the future.  He was recently diagnosed with Paget's disease and is followed at [facility] for this.  He will be undergoing repeat fasting laboratory next week with his primary provider.  Clinically he is doing well on his current medical regimen.  As long as he is stable, I will see him in 6 months for reevaluation or sooner as needed.   Medication Adjustments/Labs and Tests Ordered: Current medicines are reviewed at length with the patient today.  Concerns regarding medicines are outlined above.  Medication changes, Labs and Tests ordered today are listed in the Patient Instructions below. Patient Instructions  Medication Instructions:  Continue same medications *If you need a refill on your cardiac medications before your next appointment, please call your pharmacy*   Lab Work: None ordered   Testing/Procedures: None ordered   Follow-Up: At [facility], you and your health needs are our priority.  As part of our continuing mission to provide you with exceptional heart care, we have created designated Provider Care Teams.  These Care Teams include your primary Cardiologist (physician) and Advanced Practice Providers (APPs -  Physician Assistants and Nurse Practitioners) who all work together to provide you with the care you need, when you need it.  We recommend signing up for the patient portal called "MyChart".  Sign up information is provided on this After Visit Summary.  MyChart is used to connect with patients for Virtual Visits (Telemedicine).  Patients are able to view lab/test results, encounter notes, upcoming appointments, etc.  Non-urgent messages can be sent to your provider as well.   To learn more about what you can do with MyChart, go to [email or web address].    Your next appointment:  6 months    Provider: Dr.Fedor Kazmierski   Do nasal flushes every night     Signed, Nicki Guadalajara, MD, [facility], ABSM Diplomate, American Board of Sleep Medicine   04/21/2023 5:18 PM    [facility] [address], [address], [place], [place]  [address] Phone: [phone]

## 2023-04-20 NOTE — Patient Instructions (Signed)
Medication Instructions:  Continue same medications *If you need a refill on your cardiac medications before your next appointment, please call your pharmacy*   Lab Work: None ordered   Testing/Procedures: None ordered   Follow-Up: At [facility], you and your health needs are our priority.  As part of our continuing mission to provide you with exceptional heart care, we have created designated Provider Care Teams.  These Care Teams include your primary Cardiologist (physician) and Advanced Practice Providers (APPs -  Physician Assistants and Nurse Practitioners) who all work together to provide you with the care you need, when you need it.  We recommend signing up for the patient portal called "MyChart".  Sign up information is provided on this After Visit Summary.  MyChart is used to connect with patients for Virtual Visits (Telemedicine).  Patients are able to view lab/test results, encounter notes, upcoming appointments, etc.  Non-urgent messages can be sent to your provider as well.   To learn more about what you can do with MyChart, go to [email or web address].    Your next appointment:  6 months    Provider: Dr.Kelly   Do nasal flushes every night

## 2023-04-21 ENCOUNTER — Encounter: Payer: Self-pay | Admitting: Cardiovascular Disease

## 2023-05-15 DIAGNOSIS — I10 Essential (primary) hypertension: Secondary | ICD-10-CM | POA: Diagnosis not present

## 2023-05-15 DIAGNOSIS — E7800 Pure hypercholesterolemia, unspecified: Secondary | ICD-10-CM | POA: Diagnosis not present

## 2023-05-15 DIAGNOSIS — Z0000 Encounter for general adult medical examination without abnormal findings: Secondary | ICD-10-CM | POA: Diagnosis not present

## 2023-05-15 DIAGNOSIS — Z23 Encounter for immunization: Secondary | ICD-10-CM | POA: Diagnosis not present

## 2023-05-15 DIAGNOSIS — Z125 Encounter for screening for malignant neoplasm of prostate: Secondary | ICD-10-CM | POA: Diagnosis not present

## 2023-05-15 DIAGNOSIS — R7303 Prediabetes: Secondary | ICD-10-CM | POA: Diagnosis not present

## 2023-07-05 ENCOUNTER — Ambulatory Visit (INDEPENDENT_AMBULATORY_CARE_PROVIDER_SITE_OTHER): Payer: BC Managed Care – PPO | Admitting: Podiatry

## 2023-07-05 ENCOUNTER — Ambulatory Visit (INDEPENDENT_AMBULATORY_CARE_PROVIDER_SITE_OTHER): Payer: BC Managed Care – PPO

## 2023-07-05 DIAGNOSIS — M778 Other enthesopathies, not elsewhere classified: Secondary | ICD-10-CM

## 2023-07-05 DIAGNOSIS — M205X1 Other deformities of toe(s) (acquired), right foot: Secondary | ICD-10-CM

## 2023-07-05 DIAGNOSIS — M2141 Flat foot [pes planus] (acquired), right foot: Secondary | ICD-10-CM

## 2023-07-05 DIAGNOSIS — M2142 Flat foot [pes planus] (acquired), left foot: Secondary | ICD-10-CM

## 2023-07-05 DIAGNOSIS — M205X2 Other deformities of toe(s) (acquired), left foot: Secondary | ICD-10-CM

## 2023-07-05 NOTE — Progress Notes (Signed)
Subjective:   Patient ID: Brandon Pu., male   DOB: 57 y.o.   MRN: [id number]   HPI Chief Complaint  Patient presents with   Hammer Toe    Bilateral hallux is applying pressure to bilateral 2nd toe. He does not wear any spacers.He sometimes have blisters that develop due to the amount of pressure. No pain.    Foot Orthotics    Patient is interested in getting new orthotics. He has had them made with [place] before.     57 year old male presents the office for above concerns.  Left big toe will start to hurt, 3-4 weeks it started. No injuries. No numbness or tinlging. No swelling.   Wants to get one pair of inserts refurbished (dropped off 3 weeks ago) and wants a new pair.    Review of Systems  All other systems reviewed and are negative.  Past Medical History:  Diagnosis Date   Arthritis    CAD in native artery    a. STEMI 04/2021 s/p DES to LAD.   Essential hypertension    Hyperlipidemia with target LDL less than 70 02/02/2016   Hypertension    Morbid obesity (HCC) 07/07/2015   OSA (obstructive sleep apnea) 09/28/2015   Severe with AHI 47/hr with successful CPAP titration to 11cm H2O   Recurrent right inguinal hernia s/p lap repair with mesh 06/09/14 06/01/2014    Past Surgical History:  Procedure Laterality Date   CARDIAC CATHETERIZATION     CORONARY STENT INTERVENTION N/A 05/27/2021   Procedure: CORONARY STENT INTERVENTION;  Surgeon: Lennette Bihari, MD;  Location: [facility];  Service: Cardiovascular;  Laterality: N/A;   CORONARY/GRAFT ACUTE MI REVASCULARIZATION N/A 05/27/2021   Procedure: Coronary/Graft Acute MI Revascularization;  Surgeon: Lennette Bihari, MD;  Location: [facility];  Service: Cardiovascular;  Laterality: N/A;   INGUINAL HERNIA REPAIR Right 10/30/2008   LEFT HEART CATH AND CORONARY ANGIOGRAPHY N/A 05/27/2021   Procedure: LEFT HEART CATH AND CORONARY ANGIOGRAPHY;  Surgeon: Lennette Bihari, MD;  Location: [facility];  Service:  Cardiovascular;  Laterality: N/A;   VENTRAL HERNIA REPAIR     Repaired twice by Dr. Luretha Murphy     Current Outpatient Medications:    aspirin EC 81 MG tablet, Take 81 mg by mouth daily., Disp: , Rfl:    BRILINTA 90 MG TABS tablet, TAKE 1 TABLET BY MOUTH 2 TIMES DAILY., Disp: 60 tablet, Rfl: 8   carvedilol (COREG) 6.25 MG tablet, TAKE 1 TABLET BY MOUTH 2 TIMES DAILY WITH A MEAL., Disp: 180 tablet, Rfl: 1   erythromycin ophthalmic ointment, erythromycin 5 mg/gram (0.5 %) eye ointment, Disp: , Rfl:    Glucosamine-Chondroitin (COSAMIN DS PO), Take 2 tablets by mouth daily., Disp: , Rfl:    losartan (COZAAR) 25 MG tablet, TAKE 0.5 TABLETS BY MOUTH AT BEDTIME., Disp: 45 tablet, Rfl: 1   magnesium oxide (MAG-OX) 400 MG tablet, Take 400 mg by mouth every evening., Disp: , Rfl:    metroNIDAZOLE (METROGEL) 1 % gel, Apply 1 application topically daily as needed (rosacea flares)., Disp: , Rfl:    nitroGLYCERIN (NITROSTAT) 0.4 MG SL tablet, Place 1 tablet (0.4 mg total) under the tongue every 5 (five) minutes as needed for chest pain (up to 3 doses. If taking 3rd dose call 911)., Disp: 25 tablet, Rfl: 3   Respiratory Therapy Supplies (CARETOUCH 2 CPAP HOSE HANGER) MISC, See admin instructions., Disp: , Rfl:    rosuvastatin (CRESTOR) 20 MG  tablet, TAKE 1 TABLET BY MOUTH EVERY DAY, Disp: 90 tablet, Rfl: 1   traMADol (ULTRAM) 50 MG tablet, Take 50 mg by mouth every 6 (six) hours as needed., Disp: , Rfl:   Allergies  Allergen Reactions   Erythromycin     Other reaction(s): stomach upset   Lipitor [Atorvastatin] Other (See Comments)    Muscle aches   Sumatriptan     Other reaction(s): bodyaches           Objective:  Physical Exam  General: AAO x3, NAD  Dermatological: Skin is warm, dry and supple bilateral. There are no open sores, no preulcerative lesions, no rash or signs of infection present.  Vascular: Dorsalis Pedis artery and Posterior Tibial artery pedal pulses are 2/4 bilateral  with immedate capillary fill time.  There is no pain with calf compression, swelling, warmth, erythema.   Neruologic: Grossly intact via light touch bilateral.   Musculoskeletal: Decreased range of motion of the first MPJ.  Hammertoe noted of the second digit.  Left second toe seems to be rubbing the hallux.  Decreased medial arch upon weightbearing.  Assessment:   57 year old male with PTTD, hallux limitus     Plan:  -Treatment options discussed including all alternatives, risks, and complications -Etiology of symptoms were discussed -X-rays obtained reviewed.  3 views of the foot were obtained.  There is no evidence of acute fracture.  Spurring present off the first MPJ.  Calcaneal spurring is present.  Decreased calcaneal inclination well.  Digital deformity noted.  -I do think open for new orthotics.  Will add a reverse Morton's extension to this as well.  Continue supportive shoe gear.  He was measured for orthotics today. -Offloading -Consider surgical invention in the future if needed if symptoms persists.  Vivi Barrack DPM

## 2023-07-10 DIAGNOSIS — M2141 Flat foot [pes planus] (acquired), right foot: Secondary | ICD-10-CM

## 2023-07-10 DIAGNOSIS — M2142 Flat foot [pes planus] (acquired), left foot: Secondary | ICD-10-CM

## 2023-07-10 NOTE — Progress Notes (Signed)
Orthotics ordered with Reverse Morton's extensions  Order placed today with [facility]  Addison Bailey Cped, CFo, CFm

## 2023-08-03 ENCOUNTER — Telehealth: Payer: Self-pay | Admitting: Podiatry

## 2023-08-03 NOTE — Telephone Encounter (Signed)
Pt calling to check on status of orthotics.He had 1 pr refurbished and 1 new pair.

## 2023-08-06 DIAGNOSIS — G4733 Obstructive sleep apnea (adult) (pediatric): Secondary | ICD-10-CM | POA: Diagnosis not present

## 2023-08-06 NOTE — Telephone Encounter (Signed)
Notified pt and he is wanting to come tomorrow to pick up the refurbished orthotics. He is aware new ones were not in yet.

## 2023-08-07 NOTE — Progress Notes (Signed)
Patient presents today to pick up custom molded foot orthotics, diagnosed with Capsulits, Hallux Limitus BIL and Pes Planus by Dr. Ardelle Anton.   New Orthotics and refurbished ones were dispensed and fit was satisfactory. Reviewed instructions for break-in and wear. Written instructions given to patient.  Patient will follow up as needed.   Addison Bailey Cped, CFo, CFm

## 2023-08-27 DIAGNOSIS — M889 Osteitis deformans of unspecified bone: Secondary | ICD-10-CM | POA: Diagnosis not present

## 2023-09-06 DIAGNOSIS — G4733 Obstructive sleep apnea (adult) (pediatric): Secondary | ICD-10-CM | POA: Diagnosis not present

## 2023-09-13 ENCOUNTER — Other Ambulatory Visit: Payer: Self-pay | Admitting: Cardiovascular Disease

## 2023-09-14 DIAGNOSIS — J0101 Acute recurrent maxillary sinusitis: Secondary | ICD-10-CM | POA: Diagnosis not present

## 2023-10-06 DIAGNOSIS — G4733 Obstructive sleep apnea (adult) (pediatric): Secondary | ICD-10-CM | POA: Diagnosis not present

## 2023-10-09 DIAGNOSIS — M62830 Muscle spasm of back: Secondary | ICD-10-CM | POA: Diagnosis not present

## 2023-10-17 ENCOUNTER — Ambulatory Visit: Payer: BC Managed Care – PPO | Admitting: Cardiovascular Disease

## 2023-11-09 ENCOUNTER — Ambulatory Visit: Payer: BC Managed Care – PPO | Admitting: Cardiology

## 2023-11-13 DIAGNOSIS — M25511 Pain in right shoulder: Secondary | ICD-10-CM | POA: Diagnosis not present

## 2023-11-15 ENCOUNTER — Other Ambulatory Visit: Payer: Self-pay | Admitting: Cardiovascular Disease

## 2023-11-22 ENCOUNTER — Encounter: Payer: Self-pay | Admitting: Cardiovascular Disease

## 2023-11-22 ENCOUNTER — Ambulatory Visit: Payer: BC Managed Care – PPO | Attending: Cardiovascular Disease | Admitting: Cardiovascular Disease

## 2023-11-22 VITALS — BP 146/80 | HR 70 | Ht 67.5 in | Wt 227.4 lb

## 2023-11-22 DIAGNOSIS — I2510 Atherosclerotic heart disease of native coronary artery without angina pectoris: Secondary | ICD-10-CM

## 2023-11-22 DIAGNOSIS — E785 Hyperlipidemia, unspecified: Secondary | ICD-10-CM

## 2023-11-22 DIAGNOSIS — I10 Essential (primary) hypertension: Secondary | ICD-10-CM | POA: Diagnosis not present

## 2023-11-22 MED ORDER — LOSARTAN POTASSIUM 25 MG PO TABS: 25.0000 mg | ORAL_TABLET | Freq: Every day | ORAL | 3 refills | Status: DC

## 2023-11-22 NOTE — Progress Notes (Signed)
Cardiology Office Note:    Date:  11/23/2023   ID:  Brandon Pu., DOB 1966/10/20, MRN [id number]  PCP:  Noberto Retort, MD   [facility] Providers Cardiologist:  Tonny Bollman, MD     Referring MD: Noberto Retort, MD   Chief Complaint  Patient presents with   Coronary Artery Disease    History of Present Illness:    Brandon Villanueva. is a 58 y.o. male presenting for follow-up of coronary artery disease.  The patient presented in 2022 with an anterior STEMI and underwent primary PCI of the proximal LAD.  His LVEF was initially 30% but normalized at follow-up with an LVEF of 65 to 70%.  The patient is here alone today.  He is doing well and is physically active with no exertional symptoms.  He denies chest pain, chest pressure, heart palpitations, orthopnea, or PND.  No leg swelling.  He is compliant with his medications.  Current Medications: Current Meds  Medication Sig   aspirin EC 81 MG tablet Take 81 mg by mouth daily.   carvedilol (COREG) 6.25 MG tablet TAKE 1 TABLET BY MOUTH TWICE A DAY WITH FOOD   erythromycin ophthalmic ointment erythromycin 5 mg/gram (0.5 %) eye ointment   Glucosamine-Chondroitin (COSAMIN DS PO) Take 2 tablets by mouth daily.   losartan (COZAAR) 25 MG tablet Take 1 tablet (25 mg total) by mouth daily.   magnesium oxide (MAG-OX) 400 MG tablet Take 400 mg by mouth every evening.   metroNIDAZOLE (METROGEL) 1 % gel Apply 1 application topically daily as needed (rosacea flares).   nitroGLYCERIN (NITROSTAT) 0.4 MG SL tablet Place 1 tablet (0.4 mg total) under the tongue every 5 (five) minutes as needed for chest pain (up to 3 doses. If taking 3rd dose call 911).   Respiratory Therapy Supplies (CARETOUCH 2 CPAP HOSE HANGER) MISC See admin instructions.   rosuvastatin (CRESTOR) 20 MG tablet TAKE 1 TABLET BY MOUTH EVERY DAY   traMADol (ULTRAM) 50 MG tablet Take 50 mg by mouth every 6 (six) hours as needed.   [DISCONTINUED] losartan (COZAAR)  25 MG tablet TAKE 1/2 TABLET BY MOUTH AT BEDTIME   [DISCONTINUED] ticagrelor (BRILINTA) 90 MG TABS tablet TAKE 1 TABLET BY MOUTH TWICE A DAY     Allergies:   Erythromycin, Lipitor [atorvastatin], and Sumatriptan   ROS:   Please see the history of present illness.    All other systems reviewed and are negative.  EKGs/Labs/Other Studies Reviewed:    The following studies were reviewed today: Cardiac Studies & Procedures   CARDIAC CATHETERIZATION  CARDIAC CATHETERIZATION 05/27/2021  Narrative   Lat 1st Diag lesion is 50% stenosed.   Mid LAD lesion is 50% stenosed.   1st Diag lesion is 60% stenosed.   Mid LAD to Dist LAD lesion is 40% stenosed.   RPAV lesion is 80% stenosed.   Ost LAD to Prox LAD lesion is 100% stenosed.   A drug-eluting stent was successfully placed.   Post intervention, there is a 0% residual stenosis.  Acute ST segment elevation anterior wall myocardial infarction secondary to total occlusion of the LAD at its ostium with TIMI 0 flow and no evidence for collaterals.  Mild narrowing of 25% in the moderate-sized bifurcating ramus intermediate vessel.  Normal left circumflex coronary artery.  Dominant RCA with focal 80% stenosis in the continuation branch just after the PDA takeoff.  Difficult but successful PCI of the LAD ostium with ultimate insertion of a 3.5  x 15 mm Resolute Frontier DES stent postdilated to 3.80 mm with the100% occlusion being reduced to 0% and TIMI 0 flow improved to TIMI-3 flow.  Once the LAD was open, there is evidence for 60% stenosis at the ostium of the diagonal vessel and diffuse irregularity of 40% in the mid LAD.  Elevated LVEDP at .  RECOMMENDATION: DAPT for minimum of 1 year. Medical therapy for concomitant CAD with possible need for intervention to the distal RCA.  A 2D echo Doppler study will be ordered for assessment of LV function with hopeful improvement and salvage of myocardium with time.  Recommend more aggressive  lipid therapy and suggest changing pravastatin to rosuvastatin in this patient intolerant to previous atorvastatin.  Plan to initiate carvedilol and possible ARB therapy in a.m. as blood pressure and heart rate allows.  Findings Coronary Findings Diagnostic  Dominance: Right  Left Anterior Descending There is mild diffuse disease throughout the vessel. Ost LAD to Prox LAD lesion is 100% stenosed. Vessel is the culprit lesion. The lesion is ulcerative. Mid LAD lesion is 50% stenosed. Mid LAD to Dist LAD lesion is 40% stenosed.  First Diagonal Branch 1st Diag lesion is 60% stenosed.  Lateral First Diagonal Branch Lat 1st Diag lesion is 50% stenosed.  Right Coronary Artery  Right Posterior Atrioventricular Artery RPAV lesion is 80% stenosed.  Intervention  Ost LAD to Prox LAD lesion Stent Pre-stent angioplasty was performed. A drug-eluting stent was successfully placed. Stent strut is well apposed. Post-stent angioplasty was performed. Post-Intervention Lesion Assessment The intervention was successful. Pre-interventional TIMI flow is 0. Post-intervention TIMI flow is 3. No complications occurred at this lesion. There is a 0% residual stenosis post intervention.   STRESS TESTS  MYOCARDIAL PERFUSION IMAGING 08/13/2017  Narrative  Nuclear stress EF: 49%.  Horizontal ST segment depression ST segment depression of 2 mm was noted during stress.  Blood pressure demonstrated a hypertensive response to exercise.  This is an intermediate risk study.  The left ventricular ejection fraction is mildly decreased (45-54%).  No ischemia Soft tissue attenuation of the anterior apex on resting images EF estimated at 49% with apical hypokinesis Marked positive ECG response with 2 mm horizontal ST segment depression in lateral leads HTN response to exercise Suggest further f/u  ECHOCARDIOGRAM  ECHOCARDIOGRAM COMPLETE 06/24/2021  Narrative ECHOCARDIOGRAM REPORT    Patient Name:    Brandon Villanueva. Date of Exam: 06/24/2021 Medical Rec #:  [id number]         Height:       68.0 in Accession #:    [id number]        Weight:       219.0 lb Date of Birth:  [birth year]         BSA:          2.124 m Patient Age:    58 years          BP:           102/64 mmHg Patient Gender: M                 HR:           59 bpm. Exam Location:  [address]  Procedure: 2D Echo, Cardiac Doppler, Color Doppler and Intracardiac Opacification Agent  Indications:    I25.5 Ischemic cardiomyopathy  History:        Patient has prior history of Echocardiogram examinations, most recent 05/28/2021. CAD and Previous Myocardial Infarction, Signs/Symptoms:Chest Pain; Risk Factors:Hypertension and Dyslipidemia.  Obstructive sleep apnea. Obesity. COVID-19.  Sonographer:    Cathie Beams RCS Referring Phys: [id number] JESSE M CLEAVER  IMPRESSIONS   1. Left ventricular ejection fraction, by estimation, is 65 to 70%. The left ventricle has normal function. The left ventricle has no regional wall motion abnormalities. Left ventricular diastolic parameters were normal. 2. Right ventricular systolic function is normal. The right ventricular size is normal. 3. The mitral valve is normal in structure. No evidence of mitral valve regurgitation. 4. The aortic valve is tricuspid. Aortic valve regurgitation is not visualized. 5. The inferior vena cava is normal in size with greater than 50% respiratory variability, suggesting right atrial pressure of 3 mmHg.  Comparison(s): The left ventricular function has improved.  FINDINGS Left Ventricle: Left ventricular ejection fraction, by estimation, is 65 to 70%. The left ventricle has normal function. The left ventricle has no regional wall motion abnormalities. The left ventricular internal cavity size was normal in size. There is no left ventricular hypertrophy. Left ventricular diastolic parameters were normal.  Right Ventricle: The right ventricular size is  normal. Right vetricular wall thickness was not assessed. Right ventricular systolic function is normal.  Left Atrium: Left atrial size was normal in size.  Right Atrium: Right atrial size was normal in size.  Pericardium: There is no evidence of pericardial effusion.  Mitral Valve: The mitral valve is normal in structure. No evidence of mitral valve regurgitation.  Tricuspid Valve: The tricuspid valve is normal in structure. Tricuspid valve regurgitation is not demonstrated.  Aortic Valve: The aortic valve is tricuspid. Aortic valve regurgitation is not visualized.  Pulmonic Valve: The pulmonic valve was normal in structure. Pulmonic valve regurgitation is not visualized.  Aorta: The aortic root and ascending aorta are structurally normal, with no evidence of dilitation.  Venous: The inferior vena cava is normal in size with greater than 50% respiratory variability, suggesting right atrial pressure of 3 mmHg.  IAS/Shunts: No atrial level shunt detected by color flow Doppler.   LEFT VENTRICLE PLAX 2D LVIDd:         4.50 cm  Diastology LVIDs:         3.10 cm  LV e' medial:    9.93 cm/s LV PW:         1.10 cm  LV E/e' medial:  5.4 LV IVS:        1.30 cm  LV e' lateral:   9.28 cm/s LVOT diam:     2.20 cm  LV E/e' lateral: 5.7 LV SV:         82 LV SV Index:   38 LVOT Area:     3.80 cm   RIGHT VENTRICLE RV S prime:     11.40 cm/s TAPSE (M-mode): 1.8 cm  LEFT ATRIUM             Index LA diam:        3.70 cm 1.74 cm/m LA Vol (A2C):   44.4 ml 20.90 ml/m LA Vol (A4C):   32.7 ml 15.39 ml/m LA Biplane Vol: 39.2 ml 18.45 ml/m AORTIC VALVE LVOT Vmax:   97.30 cm/s LVOT Vmean:  63.600 cm/s LVOT VTI:    0.215 m  AORTA Ao Root diam: 3.20 cm  MITRAL VALVE MV Area (PHT): 2.74 cm    SHUNTS MV Decel Time: 277 msec    Systemic VTI:  0.22 m MV E velocity: 53.23 cm/s  Systemic Diam: 2.20 cm MV A velocity: 56.50 cm/s MV E/A ratio:  0.94  Dietrich Pates MD  Electronically signed by  Dietrich Pates MD Signature Date/Time: 06/24/2021/11:34:23 AM    Final   MONITORS  CARDIAC EVENT MONITOR 04/02/2018  Narrative  Sinus rhythm with average heart rate 79 bpm. Sinus tachycardia as high as 135 bpm.  CT SCANS  CT CARDIAC SCORING (SELF PAY ONLY) 07/07/2015  Addendum 07/08/2015  8:32 AM ADDENDUM REPORT: 07/08/2015 08:30  EXAM: OVER-READ INTERPRETATION  CT CHEST  The following report is an over-read performed by radiologist Dr. Arliss Journey [facility] on 07/08/2015. This over-read does not include interpretation of cardiac or coronary anatomy or pathology. The calcium score interpretation by the cardiologist is attached.  COMPARISON:  Chest radiograph of 04/19/2015.  CT of 03/05/2012.  FINDINGS: Mediastinum/Nodes: No imaged thoracic adenopathy. Normal descending thoracic aorta.  Lungs/Pleura: No imaged pleural fluid. A right lower lobe subpleural pulmonary nodule measures 6 mm on image 7 of series 4. This area was mildly motion degraded on the prior exam. Nodule not definitely identified.  A 3 mm subpleural right lower lobe pulmonary nodule more medially on image 80 of series 4 is also not readily apparent on the prior exam.  Upper abdomen: Mild hepatic steatosis. Normal imaged portions of the stomach.  Musculoskeletal: No acute osseous abnormality.  IMPRESSION: 1. Right lower lobe pulmonary nodules up to 6 mm. Not readily apparent on the prior exam; this was motion degraded. If the patient is at high risk for bronchogenic carcinoma, follow-up chest CT at 6-12 months is recommended. If the patient is at low risk for bronchogenic carcinoma, follow-up chest CT at 12 months is recommended. This recommendation follows the consensus statement: "Guidelines for Management of Small Pulmonary Nodules Detected on CT Scans: A Statement from the Fleischner Society" as published in Radiology2005;237:395-400. Online  at: [email or web address].[email or web address]. 2. Mild hepatic steatosis. These results will be called to the ordering clinician or representative by the Radiologist Assistant, and communication documented in the PACS or zVision Dashboard.   Electronically Signed By: Jeronimo Greaves M.D. On: 07/08/2015 08:30  Narrative CLINICAL DATA:  Risk stratification  EXAM: Coronary Calcium Score  MEDICATIONS: None.  TECHNIQUE: The patient was scanned on a Siemens Sensation 16 slice scanner. Axial non-contrast 3 mm slices were carried out through the heart. The data set was analyzed on a dedicated work station and scored using the Agatson method.  FINDINGS: Non-cardiac: See separate report from [facility].  Ascending Aorta:  Normal caliber.  No calcification.  Pericardium: Normal.  Coronary arteries:  Normal origin.  IMPRESSION: Coronary calcium score of 0.7. This was 84 percentile for age and sex matched control. There is a trivial focal calcification in the LAD, given patient's young age the percentile appear high, however is insignificant.  Tobias Alexander  Electronically Signed: By: Tobias Alexander On: 07/07/2015 16:07   CT SCANS  CT CORONARY MORPH W/CTA COR W/SCORE 03/05/2012  Narrative *RADIOLOGY REPORT*  INDICATION:  58 year old male with left anterior chest pain.  Short of breath.  CT ANGIOGRAPHY OF THE HEART, CORONARY ARTERY, STRUCTURE, AND MORPHOLOGY  CONTRAST: 80mL OMNIPAQUE IOHEXOL 350 MG/ML SOLN  COMPARISON:  Chest radiograph 03/04/2012  TECHNIQUE:  CT angiography of the coronary vessels was performed on a 256 channel system using prospective ECG gating.  A scout and noncontrast exam (for calcium scoring) were performed.  Circulation time was measured using a test bolus.  Coronary CTA was performed with sub mm slice collimation during portions of the cardiac cycle after prior injection of iodinated contrast.  Imaging  post processing was performed on an  independent workstation creating multiplanar and 3-D images, and quantitative analysis of the heart and coronary arteries.  Note that this exam targets the heart and the chest was not imaged in its entirety.  PREMEDICATION: Lopressor 100 mg, P.O. Lopressor 5 mg, IV Nitroglycerin 400 mcg, sublingual.  FINDINGS: Technical quality:  Good  Heart rate:  62  CORONARY ARTERIES: Left main coronary artery:  No coronary artery disease. Left anterior descending:  There is a large D1 branch of the left anterior descending artery. At the origin of the D1 branch there is an the eccentric calcified and low density plaque which narrows the vessel by approximately 50%. At the origin of the second diagonal branch there is a eccentric calcified plaque without significant stenosis.  Left circumflex:  No atherosclerotic disease Right coronary artery:  No atherosclerotic disease.  Small acute marginal branch without atherosclerotic disease. Posterior descending artery:  No atherosclerotic disease. Dominance:  Right  CORONARY CALCIUM: Total Agatston Score:  42 MESA database percentile:  81%  CARDIAC MEASUREMENTS: Interventricular septum (6 - 12 mm): LV posterior wall (6 - 12 mm):  LV diameter in diastole (35 - 52 mm):  AORTA AND PULMONARY MEASUREMENTS: Aortic root (21 - 40 mm): 29  at the annulus 39  at the sinuses of Valsalva 29  at the sinotubular junction Ascending aorta ( <  40 mm):  31 Descending aorta ( <  40 mm):  23 Main pulmonary artery:  ( <  30 mm):  26  EXTRACARDIAC FINDINGS: There is mild atelectasis at the lung bases.  No significant pleural fluid.  No mediastinal adenopathy.  Review of the upper abdomen unremarkable.  Limited view of the skeleton is unremarkable.  IMPRESSION:  1.  Atherosclerotic coronary artery disease involving the left anterior descending artery with no significant stenosis.  2.  Coronary artery calcium score  equal 42 which is 81 st percentile for patient's matches aging gender.  3.  Right coronary artery dominance.  Report was called to CDU mid level at [phone] at 11:50 on 03/05/2012.  Original Report Authenticated By: Genevive Bi, M.D.          EKG:        Recent Labs: No results found for requested labs within last 365 days.  Recent Lipid Panel    Component Value Date/Time   CHOL 102 07/08/2021 0741   TRIG 62 07/08/2021 0741   HDL 44 07/08/2021 0741   CHOLHDL 2.3 07/08/2021 0741   CHOLHDL 3.6 05/27/2021 1630   VLDL 17 05/27/2021 1630   LDLCALC 44 07/08/2021 0741          Physical Exam:    VS:  BP (!) 146/80   Pulse 70   Ht 5' 7.5" (1.715 m)   Wt 227 lb 6.4 oz (103.1 kg)   SpO2 98%   BMI 35.09 kg/m     Wt Readings from Last 3 Encounters:  11/22/23 227 lb 6.4 oz (103.1 kg)  04/20/23 216 lb 12.8 oz (98.3 kg)  10/19/22 223 lb 3.2 oz (101.2 kg)     GEN:  Well nourished, well developed in no acute distress HEENT: Normal NECK: No JVD; No carotid bruits LYMPHATICS: No lymphadenopathy CARDIAC: RRR, no murmurs, rubs, gallops RESPIRATORY:  Clear to auscultation without rales, wheezing or rhonchi  ABDOMEN: Soft, non-tender, non-distended MUSCULOSKELETAL:  No edema; No deformity  SKIN: Warm and dry NEUROLOGIC:  Alert and oriented x 3 PSYCHIATRIC:  Normal affect   Assessment & Plan Coronary artery disease involving native coronary  artery of native heart without angina pectoris I reviewed the patient's cardiac catheterization films from his acute event and he was noted to have total occlusion of the proximal LAD with an excellent result obtained with primary PCI.  He is greater than 2 years out and is appropriate now to discontinue ticagrelor.  He should remain on aspirin 81 mg daily.  He is encouraged to work on diet and exercise in order to make positive lifestyle changes and reduce his cardiovascular risk further.  The patient is compliant with CPAP for treatment  of obstructive sleep apnea.  I would like to see him back in 1 year for follow-up evaluation. Hyperlipidemia LDL goal <70 Continue rosuvastatin 20 mg daily. Essential hypertension Increase losartan to 25 mg daily.     Medication Adjustments/Labs and Tests Ordered: Current medicines are reviewed at length with the patient today.  Concerns regarding medicines are outlined above.  No orders of the defined types were placed in this encounter.  Meds ordered this encounter  Medications   losartan (COZAAR) 25 MG tablet    Sig: Take 1 tablet (25 mg total) by mouth daily.    Dispense:  90 tablet    Refill:  3    Increasing dose    Patient Instructions  Medication Instructions:  Stop Brilinta Increase Losartan to 25 mg once daily *If you need a refill on your cardiac medications before your next appointment, please call your pharmacy*  Follow-Up: At [facility], you and your health needs are our priority.  As part of our continuing mission to provide you with exceptional heart care, we have created designated Provider Care Teams.  These Care Teams include your primary Cardiologist (physician) and Advanced Practice Providers (APPs -  Physician Assistants and Nurse Practitioners) who all work together to provide you with the care you need, when you need it.  We recommend signing up for the patient portal called "MyChart".  Sign up information is provided on this After Visit Summary.  MyChart is used to connect with patients for Virtual Visits (Telemedicine).  Patients are able to view lab/test results, encounter notes, upcoming appointments, etc.  Non-urgent messages can be sent to your provider as well.   To learn more about what you can do with MyChart, go to [email or web address].    Your next appointment:   1 year(s)  Provider:   Tonny Bollman, MD   Other Instructions   1st Floor: - Lobby - Registration  - Pharmacy  - Lab - Cafe  2nd Floor: - PV Lab - Diagnostic  Testing (echo, CT, nuclear med)  3rd Floor: - Vacant  4th Floor: - TCTS (cardiothoracic surgery) - AFib Clinic - Structural Heart Clinic - Vascular Surgery  - Vascular Ultrasound  5th Floor: - HeartCare Cardiology (general and EP) - Clinical Pharmacy for coumadin, hypertension, lipid, weight-loss medications, and med management appointments    Valet parking services will be available as well.          Signed, Tonny Bollman, MD  11/23/2023 5:30 PM    [facility]

## 2023-11-22 NOTE — Patient Instructions (Signed)
Medication Instructions:  Stop Brilinta Increase Losartan to 25 mg once daily *If you need a refill on your cardiac medications before your next appointment, please call your pharmacy*  Follow-Up: At [facility], you and your health needs are our priority.  As part of our continuing mission to provide you with exceptional heart care, we have created designated Provider Care Teams.  These Care Teams include your primary Cardiologist (physician) and Advanced Practice Providers (APPs -  Physician Assistants and Nurse Practitioners) who all work together to provide you with the care you need, when you need it.  We recommend signing up for the patient portal called "MyChart".  Sign up information is provided on this After Visit Summary.  MyChart is used to connect with patients for Virtual Visits (Telemedicine).  Patients are able to view lab/test results, encounter notes, upcoming appointments, etc.  Non-urgent messages can be sent to your provider as well.   To learn more about what you can do with MyChart, go to [email or web address].    Your next appointment:   1 year(s)  Provider:   Tonny Bollman, MD   Other Instructions   1st Floor: - Lobby - Registration  - Pharmacy  - Lab - Cafe  2nd Floor: - PV Lab - Diagnostic Testing (echo, CT, nuclear med)  3rd Floor: - Vacant  4th Floor: - TCTS (cardiothoracic surgery) - AFib Clinic - Structural Heart Clinic - Vascular Surgery  - Vascular Ultrasound  5th Floor: - [facility] Cardiology (general and EP) - Clinical Pharmacy for coumadin, hypertension, lipid, weight-loss medications, and med management appointments    Valet parking services will be available as well.

## 2023-11-23 NOTE — Assessment & Plan Note (Signed)
Increase losartan to 25 mg daily.

## 2023-12-02 DIAGNOSIS — M25511 Pain in right shoulder: Secondary | ICD-10-CM | POA: Diagnosis not present

## 2023-12-11 DIAGNOSIS — M75121 Complete rotator cuff tear or rupture of right shoulder, not specified as traumatic: Secondary | ICD-10-CM | POA: Diagnosis not present

## 2023-12-14 ENCOUNTER — Other Ambulatory Visit: Payer: Self-pay | Admitting: Family Medicine

## 2023-12-14 DIAGNOSIS — R911 Solitary pulmonary nodule: Secondary | ICD-10-CM

## 2023-12-19 ENCOUNTER — Other Ambulatory Visit: Payer: Self-pay

## 2023-12-19 ENCOUNTER — Emergency Department (HOSPITAL_COMMUNITY)
Admission: EM | Admit: 2023-12-19 | Discharge: 2023-12-19 | Disposition: A | Discharge status: Home / Self Care | Payer: BC Managed Care – PPO | Attending: Emergency Medicine | Admitting: Emergency Medicine

## 2023-12-19 ENCOUNTER — Emergency Department (HOSPITAL_COMMUNITY): Payer: BC Managed Care – PPO

## 2023-12-19 DIAGNOSIS — I2510 Atherosclerotic heart disease of native coronary artery without angina pectoris: Secondary | ICD-10-CM | POA: Insufficient documentation

## 2023-12-19 DIAGNOSIS — N200 Calculus of kidney: Secondary | ICD-10-CM

## 2023-12-19 DIAGNOSIS — N132 Hydronephrosis with renal and ureteral calculous obstruction: Secondary | ICD-10-CM | POA: Diagnosis not present

## 2023-12-19 DIAGNOSIS — R109 Unspecified abdominal pain: Secondary | ICD-10-CM | POA: Diagnosis not present

## 2023-12-19 DIAGNOSIS — Z7982 Long term (current) use of aspirin: Secondary | ICD-10-CM | POA: Insufficient documentation

## 2023-12-19 LAB — URINALYSIS, ROUTINE W REFLEX MICROSCOPIC
Bilirubin Urine: NEGATIVE
Glucose, UA: NEGATIVE mg/dL
Ketones, ur: NEGATIVE mg/dL
Leukocytes,Ua: NEGATIVE
Nitrite: NEGATIVE
Protein, ur: NEGATIVE mg/dL
Specific Gravity, Urine: 1.009 (ref 1.005–1.030)
pH: 8 (ref 5.0–8.0)

## 2023-12-19 LAB — BASIC METABOLIC PANEL
Anion gap: 11 (ref 5–15)
BUN: 14 mg/dL (ref 6–20)
CO2: 20 mmol/L — ABNORMAL LOW (ref 22–32)
Calcium: 9.5 mg/dL (ref 8.9–10.3)
Chloride: 108 mmol/L (ref 98–111)
Creatinine, Ser: 0.83 mg/dL (ref 0.61–1.24)
GFR, Estimated: >60 mL/min (ref >60)
Glucose, Bld: 121 mg/dL — ABNORMAL HIGH (ref 70–99)
Potassium: 4.4 mmol/L (ref 3.5–5.1)
Sodium: 139 mmol/L (ref 135–145)

## 2023-12-19 LAB — CBC
HCT: 43.9 % (ref 39.0–52.0)
Hemoglobin: 14.5 g/dL (ref 13.0–17.0)
MCH: 30.1 pg (ref 26.0–34.0)
MCHC: 33 g/dL (ref 30.0–36.0)
MCV: 91.3 fL (ref 80.0–100.0)
Platelets: 194 10*3/uL (ref 150–400)
RBC: 4.81 MIL/uL (ref 4.22–5.81)
RDW: 12.3 % (ref 11.5–15.5)
WBC: 8.1 10*3/uL (ref 4.0–10.5)
nRBC: 0 % (ref 0.0–0.2)

## 2023-12-19 MED ORDER — ONDANSETRON HCL 4 MG PO TABS: 4.0000 mg | ORAL_TABLET | Freq: Four times a day (QID) | ORAL | 0 refills | Status: AC

## 2023-12-19 MED ORDER — TAMSULOSIN HCL 0.4 MG PO CAPS: 0.4000 mg | ORAL_CAPSULE | Freq: Every day | ORAL | 0 refills | Status: AC

## 2023-12-19 MED ORDER — OXYCODONE HCL 5 MG PO TABS: 5.0000 mg | ORAL_TABLET | Freq: Four times a day (QID) | ORAL | 0 refills | Status: AC | PRN

## 2023-12-19 NOTE — ED Provider Notes (Signed)
 [place] EMERGENCY DEPARTMENT AT [facility] Provider Note   CSN: [id number] Arrival date & time: 12/19/23  1005     History  Chief Complaint  Patient presents with   Flank Pain    Brandon Villanueva. is a 58 y.o. male.  Patient is a 58 year old male with a past medical history of CAD presenting to the emergency department with left-sided flank pain and urinary urgency.  Patient states that since last night he has been having urinary urgency, feeling like he needs to urinate but no urine is coming out.  He states that he then urinated a couple times this morning normally.  He states while at work this morning he started to develop a severe left-sided flank pain with nausea.  He states that he became lightheaded and sweaty and nearly passed out.  He was evaluated at the clinic at his work and was found to have hematuria in his urine and was recommended to come to the ER to evaluate for possible kidney stone.  He states that the pain has been coming and going since this morning and that he has been able to urinate but still has urinary urgency and frequency.  He denies any dysuria.  The history is provided by the patient and the spouse.  Flank Pain       Home Medications Prior to Admission medications   Medication Sig Start Date End Date Taking? Authorizing Provider  ondansetron (ZOFRAN) 4 MG tablet Take 1 tablet (4 mg total) by mouth every 6 (six) hours. 12/19/23  Yes Theresia Lo, Benetta Spar K, DO  oxyCODONE (ROXICODONE) 5 MG immediate release tablet Take 1 tablet (5 mg total) by mouth every 6 (six) hours as needed for severe pain (pain score 7-10). 12/19/23  Yes Theresia Lo, Benetta Spar K, DO  tamsulosin (FLOMAX) 0.4 MG CAPS capsule Take 1 capsule (0.4 mg total) by mouth daily. 12/19/23  Yes Elayne Snare K, DO  aspirin EC 81 MG tablet Take 81 mg by mouth daily.    [provider]  carvedilol (COREG) 6.25 MG tablet TAKE 1 TABLET BY MOUTH TWICE A DAY WITH FOOD 09/14/23    Lennette Bihari, MD  erythromycin ophthalmic ointment erythromycin 5 mg/gram (0.5 %) eye ointment    [provider]  Glucosamine-Chondroitin (COSAMIN DS PO) Take 2 tablets by mouth daily.    [provider]  losartan (COZAAR) 25 MG tablet Take 1 tablet (25 mg total) by mouth daily. 11/22/23   Tonny Bollman, MD  magnesium oxide (MAG-OX) 400 MG tablet Take 400 mg by mouth every evening.    [provider]  metroNIDAZOLE (METROGEL) 1 % gel Apply 1 application topically daily as needed (rosacea flares). 06/09/21   [provider]  nitroGLYCERIN (NITROSTAT) 0.4 MG SL tablet Place 1 tablet (0.4 mg total) under the tongue every 5 (five) minutes as needed for chest pain (up to 3 doses. If taking 3rd dose call 911). 05/29/21   Laurann Montana, PA-C  Respiratory Therapy Supplies (CARETOUCH 2 CPAP HOSE HANGER) MISC See admin instructions. 08/22/16   [provider]  rosuvastatin (CRESTOR) 20 MG tablet TAKE 1 TABLET BY MOUTH EVERY DAY 09/14/23   Lennette Bihari, MD  traMADol (ULTRAM) 50 MG tablet Take 50 mg by mouth every 6 (six) hours as needed. 03/22/22   [provider]      Allergies    Erythromycin, Lipitor [atorvastatin], and Sumatriptan    Review of Systems   Review of Systems  Genitourinary:  Positive for flank pain.    Physical Exam Updated Vital Signs BP (!) 165/92 (BP Location: Left Arm)   Pulse 64   Temp 98.3 F (36.8 C) (Oral)   Resp 18   Ht 5\' 7"  (1.702 m)   Wt 104.8 kg   SpO2 100%   BMI 36.18 kg/m  Physical Exam Vitals and nursing note reviewed.  Constitutional:      General: He is not in acute distress.    Appearance: Normal appearance. He is obese.  HENT:     Head: Normocephalic.     Nose: Nose normal.     Mouth/Throat:     Mouth: Mucous membranes are moist.  Eyes:     Extraocular Movements: Extraocular movements intact.     Conjunctiva/sclera: Conjunctivae normal.  Cardiovascular:     Rate and Rhythm: Normal rate  and regular rhythm.     Heart sounds: Normal heart sounds.  Pulmonary:     Effort: Pulmonary effort is normal.     Breath sounds: Normal breath sounds.  Abdominal:     General: Abdomen is flat.     Palpations: Abdomen is soft.     Tenderness: There is no abdominal tenderness. There is no right CVA tenderness or left CVA tenderness.  Musculoskeletal:        General: Normal range of motion.     Cervical back: Normal range of motion.  Skin:    General: Skin is warm and dry.  Neurological:     General: No focal deficit present.     Mental Status: He is alert and oriented to person, place, and time.  Psychiatric:        Mood and Affect: Mood normal.        Behavior: Behavior normal.     ED Results / Procedures / Treatments   Labs (all labs ordered are listed, but only abnormal results are displayed) Labs Reviewed  URINALYSIS, ROUTINE W REFLEX MICROSCOPIC - Abnormal; Notable for the following components:      Result Value   Hgb urine dipstick MODERATE (*)    Bacteria, UA RARE (*)    All other components within normal limits  BASIC METABOLIC PANEL - Abnormal; Notable for the following components:   CO2 20 (*)    Glucose, Bld 121 (*)    All other components within normal limits  CBC    EKG None  Radiology CT Renal Stone Study Result Date: 12/19/2023 CLINICAL DATA:  Acute left flank pain. EXAM: CT ABDOMEN AND PELVIS WITHOUT CONTRAST TECHNIQUE: Multidetector CT imaging of the abdomen and pelvis was performed following the standard protocol without IV contrast. RADIATION DOSE REDUCTION: This exam was performed according to the departmental dose-optimization program which includes automated exposure control, adjustment of the mA and/or kV according to patient size and/or use of iterative reconstruction technique. COMPARISON:  None Available. FINDINGS: Lower chest: No acute abnormality. Hepatobiliary: No focal liver abnormality is seen. No gallstones, gallbladder wall thickening, or  biliary dilatation. Pancreas: Unremarkable. No pancreatic ductal dilatation or surrounding inflammatory changes. Spleen: Normal in size without focal abnormality. Adrenals/Urinary Tract: Adrenal glands appear normal. Mild left hydroureteronephrosis is noted secondary to 4 mm calculus in distal left ureter. Right kidney and ureter are unremarkable. Urinary bladder is unremarkable. Stomach/Bowel: Stomach is within normal limits. Appendix appears normal. No evidence of bowel wall thickening, distention, or inflammatory changes. Vascular/Lymphatic: No significant vascular findings are present. No enlarged abdominal or pelvic lymph nodes. Reproductive: Prostate is unremarkable. Other: Status post ventral hernia  repair.  No ascites. Musculoskeletal: Cortical thickening and coarse trabecula are seen involving the right iliac bone concerning for Paget's disease. IMPRESSION: Mild left hydroureteronephrosis is noted secondary to 4 mm calculus in distal left ureter. Cortical thickening and coarsened trabecula are seen involving right iliac bone concerning for Paget's disease. Electronically Signed   By: Lupita Raider M.D.   On: 12/19/2023 13:12    Procedures Procedures    Medications Ordered in ED Medications - No data to display  ED Course/ Medical Decision Making/ A&P Clinical Course as of 12/19/23 1333  Wed Dec 19, 2023  1321 CT with mild L-sided hydro and 4mm obstructing stone in the distal L ureter. [VK]    Clinical Course User Index [VK] Rexford Maus, DO                                 Medical Decision Making This patient presents to the ED with chief complaint(s) of urinary urgency, flank pain with pertinent past medical history of CAD which further complicates the presenting complaint. The complaint involves an extensive differential diagnosis and also carries with it a high risk of complications and morbidity.    The differential diagnosis includes pyelonephritis, nephrolithiasis, UTI,  diverticulitis or other intra-abdominal infection  Additional history obtained: Additional history obtained from spouse Records reviewed outpatient cardiology records  ED Course and Reassessment: On patient arrival he is hemodynamically stable in no acute distress.  Initially had labs and CT stone search ordered in triage.  Urinalysis here does show hematuria but no signs of UTI, labs and CT are otherwise pending at this time.  He reports pain is currently controlled and declined any pain or nausea control at this time will be closely reassessed.  Independent labs interpretation:  The following labs were independently interpreted: hematuria, otherwise labs within normal range  Independent visualization of imaging: - I independently visualized the following imaging with scope of interpretation limited to determining acute life threatening conditions related to emergency care: CT stone search, which revealed 4mm stone in distal L ureter with mild hydro  Consultation: - Consulted or discussed management/test interpretation w/ external professional: N/A  Consideration for admission or further workup: Patient has no emergent conditions requiring admission or further work-up at this time and is stable for discharge home with urology follow-up  Social Determinants of health: N/A    Amount and/or Complexity of Data Reviewed Labs: ordered.  Risk Prescription drug management.          Final Clinical Impression(s) / ED Diagnoses Final diagnoses:  Kidney stone    Rx / DC Orders ED Discharge Orders          Ordered    tamsulosin (FLOMAX) 0.4 MG CAPS capsule  Daily        12/19/23 1330    ondansetron (ZOFRAN) 4 MG tablet  Every 6 hours        12/19/23 1330    oxyCODONE (ROXICODONE) 5 MG immediate release tablet  Every 6 hours PRN        12/19/23 1330              Woodland, [place] K, DO 12/19/23 1333

## 2023-12-19 NOTE — ED Triage Notes (Signed)
 Pt arrived via POV. C/o L flank pain and intermittent issues urinating since yesterday. Was told his urine had blood in it this AM at the clinic.  AOx4

## 2023-12-19 NOTE — Discharge Instructions (Addendum)
 You were seen in the emergency department for your urinary symptoms and your abdominal pain.  Your workup shows that you have a 4 mm kidney stone in your ureter between your kidney and your bladder.  Once it reaches your bladder it is usually the last place that gets stuck before you are able to pass it.  Given you prescription of Flomax you should take daily until you pass the stone and can drink plenty of fluids.  You can take Tylenol and Motrin every 6 hours as needed for pain and I have given you oxycodone for breakthrough pain.  This can make you drowsy so do not take it while driving, working or operating heavy machinery and you can take Zofran as needed for nausea.  You can follow-up with the urologist to have your symptoms rechecked.  You should return to the emergency department for uncontrollable pain, repetitive vomiting despite the nausea medicine, fevers or any other new or concerning symptoms.

## 2023-12-25 DIAGNOSIS — N201 Calculus of ureter: Secondary | ICD-10-CM | POA: Diagnosis not present

## 2023-12-28 ENCOUNTER — Telehealth: Payer: Self-pay | Admitting: *Deleted

## 2023-12-28 NOTE — Telephone Encounter (Signed)
 The patient was recently evaluated and had no symptoms of angina, arrhythmia, or heart failure.  He can achieve well above 4 metabolic equivalents.  He is a low risk of cardiovascular complications related to upcoming surgery.  He is at low risk of holding aspirin for surgery if needed.  Okay to hold x 7 days preoperatively.  He should resume aspirin after surgery as soon as it is safe from a postoperative bleeding standpoint.

## 2023-12-28 NOTE — Telephone Encounter (Signed)
   Pre-operative Risk Assessment    Patient Name: Brandon Villanueva.  DOB: [birth year] MRN: [id number]   Date of last office visit: 11/22/2023 Date of next office visit: NONE   Request for Surgical Clearance    Procedure:   RIGHT SHOULDER SCOPE WITH RCR, BICEPS TENODESIS, SAD  Date of Surgery:  Clearance TBD                                Surgeon:  DR. Duwayne Heck Surgeon's Group or Practice Name:  [facility] Phone number:  [phone] Fax number:  [phone]   Type of Clearance Requested:   - Medical  - Pharmacy:  Hold Aspirin NOT INDICATED HOW LONG   Type of Anesthesia:  Not Indicated   Additional requests/questions:    [name]   12/28/2023, 7:01 AM

## 2023-12-28 NOTE — Telephone Encounter (Signed)
   Patient Name: Brandon Villanueva.  DOB: September 21, 1966 MRN: [id number]  Primary Cardiologist: Tonny Bollman, MD  Chart reviewed as part of pre-operative protocol coverage. Given past medical history and time since last visit, based on ACC/AHA guidelines, Kery Batzel. is at acceptable risk for the planned procedure without further cardiovascular testing.   The patient was last seen in office by Dr. Excell Seltzer on 11/22/2023. Per Dr. Excell Seltzer, "The patient was recently evaluated and had no symptoms of angina, arrhythmia, or heart failure.  He can achieve well above 4 metabolic equivalents.  He is a low risk of cardiovascular complications related to upcoming surgery.  He is at low risk of holding aspirin for surgery if needed.  Okay to hold x 7 days preoperatively.  He should resume aspirin after surgery as soon as it is safe from a postoperative bleeding standpoint."  I will route this recommendation to the requesting party via Epic fax function and remove from pre-op pool.  Please call with questions.  Joylene Grapes, NP 12/28/2023, 9:09 AM

## 2023-12-29 HISTORY — PX: OTHER SURGICAL HISTORY: SHX169

## 2024-01-04 DIAGNOSIS — R300 Dysuria: Secondary | ICD-10-CM | POA: Diagnosis not present

## 2024-01-04 DIAGNOSIS — B349 Viral infection, unspecified: Secondary | ICD-10-CM | POA: Diagnosis not present

## 2024-01-08 ENCOUNTER — Ambulatory Visit
Admission: RE | Admit: 2024-01-08 | Discharge: 2024-01-08 | Disposition: A | Discharge status: Home / Self Care | Payer: BC Managed Care – PPO | Source: Ambulatory Visit | Attending: Family Medicine | Admitting: Family Medicine

## 2024-01-08 DIAGNOSIS — R918 Other nonspecific abnormal finding of lung field: Secondary | ICD-10-CM | POA: Diagnosis not present

## 2024-01-08 DIAGNOSIS — R911 Solitary pulmonary nodule: Secondary | ICD-10-CM

## 2024-01-23 DIAGNOSIS — M7581 Other shoulder lesions, right shoulder: Secondary | ICD-10-CM | POA: Diagnosis not present

## 2024-01-23 DIAGNOSIS — M7521 Bicipital tendinitis, right shoulder: Secondary | ICD-10-CM | POA: Diagnosis not present

## 2024-01-23 DIAGNOSIS — M7551 Bursitis of right shoulder: Secondary | ICD-10-CM | POA: Diagnosis not present

## 2024-01-23 DIAGNOSIS — G8918 Other acute postprocedural pain: Secondary | ICD-10-CM | POA: Diagnosis not present

## 2024-01-23 DIAGNOSIS — M24111 Other articular cartilage disorders, right shoulder: Secondary | ICD-10-CM | POA: Diagnosis not present

## 2024-01-23 DIAGNOSIS — M25811 Other specified joint disorders, right shoulder: Secondary | ICD-10-CM | POA: Diagnosis not present

## 2024-01-23 DIAGNOSIS — M75121 Complete rotator cuff tear or rupture of right shoulder, not specified as traumatic: Secondary | ICD-10-CM | POA: Diagnosis not present

## 2024-01-23 DIAGNOSIS — M7541 Impingement syndrome of right shoulder: Secondary | ICD-10-CM | POA: Diagnosis not present

## 2024-01-23 DIAGNOSIS — S43431D Superior glenoid labrum lesion of right shoulder, subsequent encounter: Secondary | ICD-10-CM | POA: Diagnosis not present

## 2024-01-30 DIAGNOSIS — M25611 Stiffness of right shoulder, not elsewhere classified: Secondary | ICD-10-CM | POA: Diagnosis not present

## 2024-01-30 DIAGNOSIS — M25511 Pain in right shoulder: Secondary | ICD-10-CM | POA: Diagnosis not present

## 2024-02-05 DIAGNOSIS — M25611 Stiffness of right shoulder, not elsewhere classified: Secondary | ICD-10-CM | POA: Diagnosis not present

## 2024-02-05 DIAGNOSIS — M25511 Pain in right shoulder: Secondary | ICD-10-CM | POA: Diagnosis not present

## 2024-02-13 DIAGNOSIS — G4733 Obstructive sleep apnea (adult) (pediatric): Secondary | ICD-10-CM | POA: Diagnosis not present

## 2024-02-15 DIAGNOSIS — M25611 Stiffness of right shoulder, not elsewhere classified: Secondary | ICD-10-CM | POA: Diagnosis not present

## 2024-02-15 DIAGNOSIS — M25511 Pain in right shoulder: Secondary | ICD-10-CM | POA: Diagnosis not present

## 2024-02-20 DIAGNOSIS — M25611 Stiffness of right shoulder, not elsewhere classified: Secondary | ICD-10-CM | POA: Diagnosis not present

## 2024-02-20 DIAGNOSIS — M25511 Pain in right shoulder: Secondary | ICD-10-CM | POA: Diagnosis not present

## 2024-02-25 DIAGNOSIS — M25611 Stiffness of right shoulder, not elsewhere classified: Secondary | ICD-10-CM | POA: Diagnosis not present

## 2024-02-25 DIAGNOSIS — M25511 Pain in right shoulder: Secondary | ICD-10-CM | POA: Diagnosis not present

## 2024-02-29 DIAGNOSIS — M25511 Pain in right shoulder: Secondary | ICD-10-CM | POA: Diagnosis not present

## 2024-02-29 DIAGNOSIS — M25611 Stiffness of right shoulder, not elsewhere classified: Secondary | ICD-10-CM | POA: Diagnosis not present

## 2024-03-04 DIAGNOSIS — M25511 Pain in right shoulder: Secondary | ICD-10-CM | POA: Diagnosis not present

## 2024-03-04 DIAGNOSIS — M25611 Stiffness of right shoulder, not elsewhere classified: Secondary | ICD-10-CM | POA: Diagnosis not present

## 2024-03-07 DIAGNOSIS — M25611 Stiffness of right shoulder, not elsewhere classified: Secondary | ICD-10-CM | POA: Diagnosis not present

## 2024-03-07 DIAGNOSIS — M25511 Pain in right shoulder: Secondary | ICD-10-CM | POA: Diagnosis not present

## 2024-03-10 DIAGNOSIS — M25511 Pain in right shoulder: Secondary | ICD-10-CM | POA: Diagnosis not present

## 2024-03-10 DIAGNOSIS — M25611 Stiffness of right shoulder, not elsewhere classified: Secondary | ICD-10-CM | POA: Diagnosis not present

## 2024-03-12 DIAGNOSIS — M25511 Pain in right shoulder: Secondary | ICD-10-CM | POA: Diagnosis not present

## 2024-03-12 DIAGNOSIS — M25611 Stiffness of right shoulder, not elsewhere classified: Secondary | ICD-10-CM | POA: Diagnosis not present

## 2024-03-14 DIAGNOSIS — G4733 Obstructive sleep apnea (adult) (pediatric): Secondary | ICD-10-CM | POA: Diagnosis not present

## 2024-03-17 DIAGNOSIS — M25511 Pain in right shoulder: Secondary | ICD-10-CM | POA: Diagnosis not present

## 2024-03-17 DIAGNOSIS — M25611 Stiffness of right shoulder, not elsewhere classified: Secondary | ICD-10-CM | POA: Diagnosis not present

## 2024-03-18 ENCOUNTER — Emergency Department (HOSPITAL_BASED_OUTPATIENT_CLINIC_OR_DEPARTMENT_OTHER)

## 2024-03-18 ENCOUNTER — Encounter (HOSPITAL_BASED_OUTPATIENT_CLINIC_OR_DEPARTMENT_OTHER): Payer: Self-pay | Admitting: Emergency Medicine

## 2024-03-18 ENCOUNTER — Other Ambulatory Visit: Payer: Self-pay

## 2024-03-18 ENCOUNTER — Emergency Department (HOSPITAL_BASED_OUTPATIENT_CLINIC_OR_DEPARTMENT_OTHER)
Admission: EM | Admit: 2024-03-18 | Discharge: 2024-03-18 | Disposition: A | Discharge status: Home / Self Care | Attending: Emergency Medicine | Admitting: Emergency Medicine

## 2024-03-18 DIAGNOSIS — I2510 Atherosclerotic heart disease of native coronary artery without angina pectoris: Secondary | ICD-10-CM | POA: Diagnosis not present

## 2024-03-18 DIAGNOSIS — I776 Arteritis, unspecified: Secondary | ICD-10-CM | POA: Diagnosis not present

## 2024-03-18 DIAGNOSIS — Z7982 Long term (current) use of aspirin: Secondary | ICD-10-CM | POA: Diagnosis not present

## 2024-03-18 DIAGNOSIS — L959 Vasculitis limited to the skin, unspecified: Secondary | ICD-10-CM | POA: Insufficient documentation

## 2024-03-18 DIAGNOSIS — M7989 Other specified soft tissue disorders: Secondary | ICD-10-CM | POA: Diagnosis not present

## 2024-03-18 DIAGNOSIS — R600 Localized edema: Secondary | ICD-10-CM | POA: Diagnosis not present

## 2024-03-18 LAB — PROTIME-INR
INR: 1 (ref 0.8–1.2)
Prothrombin Time: 12.9 s (ref 11.4–15.2)

## 2024-03-18 LAB — COMPREHENSIVE METABOLIC PANEL WITH GFR
ALT: 26 U/L (ref 0–44)
AST: 29 U/L (ref 15–41)
Albumin: 4.3 g/dL (ref 3.5–5.0)
Alkaline Phosphatase: 70 U/L (ref 38–126)
Anion gap: 11 (ref 5–15)
BUN: 12 mg/dL (ref 6–20)
CO2: 27 mmol/L (ref 22–32)
Calcium: 10 mg/dL (ref 8.9–10.3)
Chloride: 103 mmol/L (ref 98–111)
Creatinine, Ser: 0.86 mg/dL (ref 0.61–1.24)
GFR, Estimated: >60 mL/min (ref >60)
Glucose, Bld: 96 mg/dL (ref 70–99)
Potassium: 4 mmol/L (ref 3.5–5.1)
Sodium: 141 mmol/L (ref 135–145)
Total Bilirubin: 0.6 mg/dL (ref 0.0–1.2)
Total Protein: 6.8 g/dL (ref 6.5–8.1)

## 2024-03-18 LAB — CBC WITH DIFFERENTIAL/PLATELET
Abs Immature Granulocytes: 0.02 10*3/uL (ref 0.00–0.07)
Basophils Absolute: 0.1 10*3/uL (ref 0.0–0.1)
Basophils Relative: 1 %
Eosinophils Absolute: 0.2 10*3/uL (ref 0.0–0.5)
Eosinophils Relative: 3 %
HCT: 45.6 % (ref 39.0–52.0)
Hemoglobin: 15.7 g/dL (ref 13.0–17.0)
Immature Granulocytes: 0 %
Lymphocytes Relative: 31 %
Lymphs Abs: 2.4 10*3/uL (ref 0.7–4.0)
MCH: 31.3 pg (ref 26.0–34.0)
MCHC: 34.4 g/dL (ref 30.0–36.0)
MCV: 90.8 fL (ref 80.0–100.0)
Monocytes Absolute: 0.6 10*3/uL (ref 0.1–1.0)
Monocytes Relative: 8 %
Neutro Abs: 4.3 10*3/uL (ref 1.7–7.7)
Neutrophils Relative %: 57 %
Platelets: 184 10*3/uL (ref 150–400)
RBC: 5.02 MIL/uL (ref 4.22–5.81)
RDW: 12.5 % (ref 11.5–15.5)
WBC: 7.6 10*3/uL (ref 4.0–10.5)
nRBC: 0 % (ref 0.0–0.2)

## 2024-03-18 LAB — URINALYSIS, ROUTINE W REFLEX MICROSCOPIC
Bilirubin Urine: NEGATIVE
Glucose, UA: NEGATIVE mg/dL
Hgb urine dipstick: NEGATIVE
Ketones, ur: NEGATIVE mg/dL
Leukocytes,Ua: NEGATIVE
Nitrite: NEGATIVE
Protein, ur: NEGATIVE mg/dL
Specific Gravity, Urine: 1.017 (ref 1.005–1.030)
pH: 5.5 (ref 5.0–8.0)

## 2024-03-18 LAB — RAPID HIV SCREEN (HIV 1/2 AB+AG)
HIV 1/2 Antibodies: NONREACTIVE
HIV-1 P24 Antigen - HIV24: NONREACTIVE

## 2024-03-18 LAB — APTT: aPTT: 27 s (ref 24–36)

## 2024-03-18 NOTE — Discharge Instructions (Addendum)
 Your blood work is overall reassuring, I believe that your blood vessels in your legs are inflamed.  Use cool compresses, ibuprofen, and elevation for your legs.  Return to the ER if you have any worsening swelling, redness, or pain Please follow-up with your PCP.

## 2024-03-18 NOTE — ED Triage Notes (Signed)
 Redness and swelling in legs R > L,  Shoulder surgery end of March, ortho suggested eval for cellulitis and r/o DVT tightness in leg

## 2024-03-18 NOTE — ED Provider Notes (Signed)
 [place] EMERGENCY DEPARTMENT AT [facility] Provider Note   CSN: [id number] Arrival date & time: 03/18/24  1443     History  Chief Complaint  Patient presents with   Leg Swelling    Sulaiman Imbert. is a 58 y.o. male history of CAD, OSA, who presents to the ED secondary to a rash that is been on both legs, it has been going on for the last couple months.  He states it started on his right leg, and it has progressed to his left leg.  States it is getting lighter than it was initially after his surgery, but is concerned since his port persistently been there.  Spoke to his surgeon, and since he stated that he had some calf tightness, and rash, to go to the ER, to get an ultrasound and antibiotics if needed.  He denies any recent medication changes, does take aspirin .  Denies any kind of recent upper respiratory illnesses, pain with urination, fevers, or chills.  No recent weight loss.     Home Medications Prior to Admission medications   Medication Sig Start Date End Date Taking? Authorizing Provider  aspirin  EC 81 MG tablet Take 81 mg by mouth daily.    [provider]  carvedilol  (COREG ) 6.25 MG tablet TAKE 1 TABLET BY MOUTH TWICE A DAY WITH FOOD 09/14/23   Millicent Ally, MD  erythromycin ophthalmic ointment erythromycin 5 mg/gram (0.5 %) eye ointment    [provider]  Glucosamine-Chondroitin (COSAMIN DS PO) Take 2 tablets by mouth daily.    [provider]  losartan  (COZAAR ) 25 MG tablet Take 1 tablet (25 mg total) by mouth daily. 11/22/23   Cooper, Michael, MD  magnesium oxide (MAG-OX) 400 MG tablet Take 400 mg by mouth every evening.    [provider]  metroNIDAZOLE (METROGEL) 1 % gel Apply 1 application topically daily as needed (rosacea flares). 06/09/21   [provider]  nitroGLYCERIN  (NITROSTAT ) 0.4 MG SL tablet Place 1 tablet (0.4 mg total) under the tongue every 5 (five) minutes as needed for chest pain (up to 3  doses. If taking 3rd dose call 911). 05/29/21   Dunn, Dayna N, PA-C  ondansetron  (ZOFRAN ) 4 MG tablet Take 1 tablet (4 mg total) by mouth every 6 (six) hours. 12/19/23   Kingsley, Victoria K, DO  oxyCODONE  (ROXICODONE ) 5 MG immediate release tablet Take 1 tablet (5 mg total) by mouth every 6 (six) hours as needed for severe pain (pain score 7-10). 12/19/23   Kingsley, Victoria K, DO  Respiratory Therapy Supplies (CARETOUCH 2 CPAP HOSE HANGER) MISC See admin instructions. 08/22/16   [provider]  rosuvastatin  (CRESTOR ) 20 MG tablet TAKE 1 TABLET BY MOUTH EVERY DAY 09/14/23   Millicent Ally, MD  tamsulosin  (FLOMAX ) 0.4 MG CAPS capsule Take 1 capsule (0.4 mg total) by mouth daily. 12/19/23   Kingsley, Victoria K, DO  traMADol (ULTRAM) 50 MG tablet Take 50 mg by mouth every 6 (six) hours as needed. 03/22/22   [provider]      Allergies    Erythromycin, Lipitor [atorvastatin ], and Sumatriptan    Review of Systems   Review of Systems  Constitutional:  Negative for fever.  Skin:  Positive for rash.    Physical Exam Updated Vital Signs BP (!) 158/96 (BP Location: Right Arm)   Pulse 72   Temp 98 F (36.7 C)   Resp 16   SpO2 96%  Physical Exam Vitals and nursing note  reviewed.  Constitutional:      General: He is not in acute distress.    Appearance: He is well-developed.  HENT:     Head: Normocephalic and atraumatic.  Eyes:     Conjunctiva/sclera: Conjunctivae normal.  Cardiovascular:     Rate and Rhythm: Normal rate and regular rhythm.     Heart sounds: No murmur heard. Pulmonary:     Effort: Pulmonary effort is normal. No respiratory distress.     Breath sounds: Normal breath sounds.  Abdominal:     Palpations: Abdomen is soft.     Tenderness: There is no abdominal tenderness.  Musculoskeletal:        General: No swelling.     Cervical back: Neck supple.  Skin:    General: Skin is warm and dry.     Capillary Refill: Capillary refill takes less than 2  seconds.     Comments: Erythematous macules, that are nonblanching, to bilateral lower extremities.  No mucosal lesions  Neurological:     Mental Status: He is alert.  Psychiatric:        Mood and Affect: Mood normal.     ED Results / Procedures / Treatments   Labs (all labs ordered are listed, but only abnormal results are displayed) Labs Reviewed  CBC WITH DIFFERENTIAL/PLATELET  COMPREHENSIVE METABOLIC PANEL WITH GFR  PROTIME-INR  APTT  URINALYSIS, ROUTINE W REFLEX MICROSCOPIC  RAPID HIV SCREEN (HIV 1/2 AB+AG)  HEPATITIS PANEL, ACUTE    EKG None  Radiology US  Venous Img Lower Right (DVT Study) Result Date: 03/18/2024 CLINICAL DATA:  RIGHT greater than LEFT lower extremity edema and redness. EXAM: RIGHT LOWER EXTREMITY VENOUS DOPPLER ULTRASOUND TECHNIQUE: Gray-scale sonography with compression, as well as color and duplex ultrasound, were performed to evaluate the deep venous system(s) from the level of the common femoral vein through the popliteal and proximal calf veins. COMPARISON:  None available FINDINGS: VENOUS Normal compressibility of the common femoral, superficial femoral, and popliteal veins, as well as the visualized calf veins. Visualized portions of profunda femoral vein and great saphenous vein unremarkable. No filling defects to suggest DVT on grayscale or color Doppler imaging. Doppler waveforms show normal direction of venous flow, normal respiratory plasticity and response to augmentation. Limited views of the contralateral common femoral vein are unremarkable. OTHER None. Limitations: none IMPRESSION: No right lower extremity DVT. Electronically Signed   By: Elester Grim M.D.   On: 03/18/2024 17:21    Procedures Procedures    Medications Ordered in ED Medications - No data to display  ED Course/ Medical Decision Making/ A&P                                 Medical Decision Making Patient has petechia bilateral lower extremities, started on the right, went  to the left.  Has no shortness of breath, chest pain, recent trauma.  Did have a shoulder replacement, done 2 months ago, and has been persistent since then.  Started in the right, and then went to the left leg.  Given persistent petechiae, we will obtain hepatitis panel HIV panel, CBC, CMP and urinalysis for further evaluation.  Will obtain DVT ultrasound, given swelling of right lower extremity.  Patient states that there really is no pain, but it feels more tight.  Has not had any fevers  Amount and/or Complexity of Data Reviewed Labs: ordered.    Details: Blood work unremarkable Discussion of management or  test interpretation with external provider(s): Patient has unremarkable labs, blood work is reassuring, I believe that this likely represents some kind of vasculitis, as this been going for the last 2 months, and he has negative ultrasound.  Will have him follow-up with his primary care doctor does not appear cellulitic in nature, as it is similar on both sides, and not warm to the touch.  Instructed on return precautions discharged home    Final Clinical Impression(s) / ED Diagnoses Final diagnoses:  Vasculitis of skin    Rx / DC Orders ED Discharge Orders     None         Ysela Hettinger, Dwaine Gip, PA 03/18/24 1933    Lowery Rue, DO 03/18/24 2254

## 2024-03-19 LAB — HEPATITIS PANEL, ACUTE
HCV Ab: NONREACTIVE
Hep A IgM: NONREACTIVE
Hep B C IgM: NONREACTIVE
Hepatitis B Surface Ag: NONREACTIVE

## 2024-03-20 DIAGNOSIS — M25611 Stiffness of right shoulder, not elsewhere classified: Secondary | ICD-10-CM | POA: Diagnosis not present

## 2024-03-20 DIAGNOSIS — M25511 Pain in right shoulder: Secondary | ICD-10-CM | POA: Diagnosis not present

## 2024-03-26 DIAGNOSIS — M25511 Pain in right shoulder: Secondary | ICD-10-CM | POA: Diagnosis not present

## 2024-03-26 DIAGNOSIS — M25611 Stiffness of right shoulder, not elsewhere classified: Secondary | ICD-10-CM | POA: Diagnosis not present

## 2024-03-28 DIAGNOSIS — M25511 Pain in right shoulder: Secondary | ICD-10-CM | POA: Diagnosis not present

## 2024-03-28 DIAGNOSIS — M25611 Stiffness of right shoulder, not elsewhere classified: Secondary | ICD-10-CM | POA: Diagnosis not present

## 2024-03-31 DIAGNOSIS — M25511 Pain in right shoulder: Secondary | ICD-10-CM | POA: Diagnosis not present

## 2024-03-31 DIAGNOSIS — M25611 Stiffness of right shoulder, not elsewhere classified: Secondary | ICD-10-CM | POA: Diagnosis not present

## 2024-04-02 DIAGNOSIS — M25511 Pain in right shoulder: Secondary | ICD-10-CM | POA: Diagnosis not present

## 2024-04-02 DIAGNOSIS — M25611 Stiffness of right shoulder, not elsewhere classified: Secondary | ICD-10-CM | POA: Diagnosis not present

## 2024-04-07 DIAGNOSIS — M25611 Stiffness of right shoulder, not elsewhere classified: Secondary | ICD-10-CM | POA: Diagnosis not present

## 2024-04-07 DIAGNOSIS — M25511 Pain in right shoulder: Secondary | ICD-10-CM | POA: Diagnosis not present

## 2024-04-09 DIAGNOSIS — M25611 Stiffness of right shoulder, not elsewhere classified: Secondary | ICD-10-CM | POA: Diagnosis not present

## 2024-04-09 DIAGNOSIS — M25511 Pain in right shoulder: Secondary | ICD-10-CM | POA: Diagnosis not present

## 2024-04-14 DIAGNOSIS — G4733 Obstructive sleep apnea (adult) (pediatric): Secondary | ICD-10-CM | POA: Diagnosis not present

## 2024-04-14 DIAGNOSIS — M25611 Stiffness of right shoulder, not elsewhere classified: Secondary | ICD-10-CM | POA: Diagnosis not present

## 2024-04-14 DIAGNOSIS — M25511 Pain in right shoulder: Secondary | ICD-10-CM | POA: Diagnosis not present

## 2024-04-21 DIAGNOSIS — M25611 Stiffness of right shoulder, not elsewhere classified: Secondary | ICD-10-CM | POA: Diagnosis not present

## 2024-04-21 DIAGNOSIS — M25511 Pain in right shoulder: Secondary | ICD-10-CM | POA: Diagnosis not present

## 2024-04-24 DIAGNOSIS — M25511 Pain in right shoulder: Secondary | ICD-10-CM | POA: Diagnosis not present

## 2024-04-24 DIAGNOSIS — M25611 Stiffness of right shoulder, not elsewhere classified: Secondary | ICD-10-CM | POA: Diagnosis not present

## 2024-04-28 DIAGNOSIS — M25511 Pain in right shoulder: Secondary | ICD-10-CM | POA: Diagnosis not present

## 2024-04-28 DIAGNOSIS — M25611 Stiffness of right shoulder, not elsewhere classified: Secondary | ICD-10-CM | POA: Diagnosis not present

## 2024-04-30 DIAGNOSIS — M25611 Stiffness of right shoulder, not elsewhere classified: Secondary | ICD-10-CM | POA: Diagnosis not present

## 2024-04-30 DIAGNOSIS — M25511 Pain in right shoulder: Secondary | ICD-10-CM | POA: Diagnosis not present

## 2024-05-06 DIAGNOSIS — M25511 Pain in right shoulder: Secondary | ICD-10-CM | POA: Diagnosis not present

## 2024-05-06 DIAGNOSIS — M25611 Stiffness of right shoulder, not elsewhere classified: Secondary | ICD-10-CM | POA: Diagnosis not present

## 2024-05-08 DIAGNOSIS — M25511 Pain in right shoulder: Secondary | ICD-10-CM | POA: Diagnosis not present

## 2024-05-08 DIAGNOSIS — M25611 Stiffness of right shoulder, not elsewhere classified: Secondary | ICD-10-CM | POA: Diagnosis not present

## 2024-05-13 DIAGNOSIS — Z4789 Encounter for other orthopedic aftercare: Secondary | ICD-10-CM | POA: Diagnosis not present

## 2024-05-14 DIAGNOSIS — M25611 Stiffness of right shoulder, not elsewhere classified: Secondary | ICD-10-CM | POA: Diagnosis not present

## 2024-05-14 DIAGNOSIS — M25511 Pain in right shoulder: Secondary | ICD-10-CM | POA: Diagnosis not present

## 2024-05-18 DIAGNOSIS — G4733 Obstructive sleep apnea (adult) (pediatric): Secondary | ICD-10-CM | POA: Diagnosis not present

## 2024-05-19 DIAGNOSIS — B360 Pityriasis versicolor: Secondary | ICD-10-CM | POA: Diagnosis not present

## 2024-05-19 DIAGNOSIS — Z125 Encounter for screening for malignant neoplasm of prostate: Secondary | ICD-10-CM | POA: Diagnosis not present

## 2024-05-19 DIAGNOSIS — I10 Essential (primary) hypertension: Secondary | ICD-10-CM | POA: Diagnosis not present

## 2024-05-19 DIAGNOSIS — R7303 Prediabetes: Secondary | ICD-10-CM | POA: Diagnosis not present

## 2024-05-19 DIAGNOSIS — Z0000 Encounter for general adult medical examination without abnormal findings: Secondary | ICD-10-CM | POA: Diagnosis not present

## 2024-05-19 DIAGNOSIS — E7800 Pure hypercholesterolemia, unspecified: Secondary | ICD-10-CM | POA: Diagnosis not present

## 2024-05-23 DIAGNOSIS — M25511 Pain in right shoulder: Secondary | ICD-10-CM | POA: Diagnosis not present

## 2024-05-23 DIAGNOSIS — M25611 Stiffness of right shoulder, not elsewhere classified: Secondary | ICD-10-CM | POA: Diagnosis not present

## 2024-05-28 DIAGNOSIS — M25511 Pain in right shoulder: Secondary | ICD-10-CM | POA: Diagnosis not present

## 2024-05-28 DIAGNOSIS — M25611 Stiffness of right shoulder, not elsewhere classified: Secondary | ICD-10-CM | POA: Diagnosis not present

## 2024-05-29 DIAGNOSIS — M65941 Unspecified synovitis and tenosynovitis, right hand: Secondary | ICD-10-CM | POA: Diagnosis not present

## 2024-06-04 DIAGNOSIS — M25511 Pain in right shoulder: Secondary | ICD-10-CM | POA: Diagnosis not present

## 2024-06-04 DIAGNOSIS — M25611 Stiffness of right shoulder, not elsewhere classified: Secondary | ICD-10-CM | POA: Diagnosis not present

## 2024-06-12 DIAGNOSIS — M25511 Pain in right shoulder: Secondary | ICD-10-CM | POA: Diagnosis not present

## 2024-06-12 DIAGNOSIS — M25611 Stiffness of right shoulder, not elsewhere classified: Secondary | ICD-10-CM | POA: Diagnosis not present

## 2024-06-18 ENCOUNTER — Encounter: Payer: Self-pay | Admitting: Cardiovascular Disease

## 2024-06-18 DIAGNOSIS — M25611 Stiffness of right shoulder, not elsewhere classified: Secondary | ICD-10-CM | POA: Diagnosis not present

## 2024-06-18 DIAGNOSIS — G4733 Obstructive sleep apnea (adult) (pediatric): Secondary | ICD-10-CM | POA: Diagnosis not present

## 2024-06-18 DIAGNOSIS — M25511 Pain in right shoulder: Secondary | ICD-10-CM | POA: Diagnosis not present

## 2024-06-23 MED ORDER — TADALAFIL 10 MG PO TABS: 10.0000 mg | ORAL_TABLET | Freq: Every day | ORAL | 3 refills | Status: AC | PRN

## 2024-06-24 DIAGNOSIS — Z9889 Other specified postprocedural states: Secondary | ICD-10-CM | POA: Diagnosis not present

## 2024-07-02 DIAGNOSIS — H6123 Impacted cerumen, bilateral: Secondary | ICD-10-CM | POA: Diagnosis not present

## 2024-07-19 DIAGNOSIS — G4733 Obstructive sleep apnea (adult) (pediatric): Secondary | ICD-10-CM | POA: Diagnosis not present

## 2024-08-15 DIAGNOSIS — M1611 Unilateral primary osteoarthritis, right hip: Secondary | ICD-10-CM | POA: Diagnosis not present

## 2024-08-15 DIAGNOSIS — M889 Osteitis deformans of unspecified bone: Secondary | ICD-10-CM | POA: Diagnosis not present

## 2024-08-27 ENCOUNTER — Emergency Department (HOSPITAL_COMMUNITY)

## 2024-08-27 ENCOUNTER — Emergency Department (HOSPITAL_COMMUNITY)
Admission: EM | Admit: 2024-08-27 | Discharge: 2024-08-27 | Disposition: A | Discharge status: Home / Self Care | Attending: Emergency Medicine | Admitting: Emergency Medicine

## 2024-08-27 ENCOUNTER — Encounter (HOSPITAL_COMMUNITY): Payer: Self-pay

## 2024-08-27 ENCOUNTER — Other Ambulatory Visit: Payer: Self-pay

## 2024-08-27 DIAGNOSIS — R1030 Lower abdominal pain, unspecified: Secondary | ICD-10-CM | POA: Diagnosis present

## 2024-08-27 DIAGNOSIS — Z79899 Other long term (current) drug therapy: Secondary | ICD-10-CM | POA: Insufficient documentation

## 2024-08-27 DIAGNOSIS — K432 Incisional hernia without obstruction or gangrene: Secondary | ICD-10-CM | POA: Diagnosis not present

## 2024-08-27 DIAGNOSIS — I10 Essential (primary) hypertension: Secondary | ICD-10-CM | POA: Insufficient documentation

## 2024-08-27 DIAGNOSIS — N281 Cyst of kidney, acquired: Secondary | ICD-10-CM | POA: Diagnosis not present

## 2024-08-27 DIAGNOSIS — R739 Hyperglycemia, unspecified: Secondary | ICD-10-CM | POA: Diagnosis not present

## 2024-08-27 DIAGNOSIS — I2510 Atherosclerotic heart disease of native coronary artery without angina pectoris: Secondary | ICD-10-CM | POA: Diagnosis not present

## 2024-08-27 DIAGNOSIS — Z7982 Long term (current) use of aspirin: Secondary | ICD-10-CM | POA: Insufficient documentation

## 2024-08-27 LAB — URINALYSIS, ROUTINE W REFLEX MICROSCOPIC
Bacteria, UA: NONE SEEN
Bilirubin Urine: NEGATIVE
Glucose, UA: NEGATIVE mg/dL
Hgb urine dipstick: NEGATIVE
Ketones, ur: NEGATIVE mg/dL
Leukocytes,Ua: NEGATIVE
Nitrite: NEGATIVE
Protein, ur: NEGATIVE mg/dL
Specific Gravity, Urine: 1.017 (ref 1.005–1.030)
pH: 6 (ref 5.0–8.0)

## 2024-08-27 LAB — COMPREHENSIVE METABOLIC PANEL WITH GFR
ALT: 28 U/L (ref 0–44)
AST: 22 U/L (ref 15–41)
Albumin: 4.3 g/dL (ref 3.5–5.0)
Alkaline Phosphatase: 63 U/L (ref 38–126)
Anion gap: 10 (ref 5–15)
BUN: 11 mg/dL (ref 6–20)
CO2: 25 mmol/L (ref 22–32)
Calcium: 9.5 mg/dL (ref 8.9–10.3)
Chloride: 102 mmol/L (ref 98–111)
Creatinine, Ser: 0.75 mg/dL (ref 0.61–1.24)
GFR, Estimated: >60 mL/min (ref >60)
Glucose, Bld: 133 mg/dL — ABNORMAL HIGH (ref 70–99)
Potassium: 4.1 mmol/L (ref 3.5–5.1)
Sodium: 136 mmol/L (ref 135–145)
Total Bilirubin: 0.6 mg/dL (ref 0.0–1.2)
Total Protein: 6.9 g/dL (ref 6.5–8.1)

## 2024-08-27 LAB — CBC
HCT: 47.5 % (ref 39.0–52.0)
Hemoglobin: 15.6 g/dL (ref 13.0–17.0)
MCH: 30.1 pg (ref 26.0–34.0)
MCHC: 32.8 g/dL (ref 30.0–36.0)
MCV: 91.7 fL (ref 80.0–100.0)
Platelets: 204 K/uL (ref 150–400)
RBC: 5.18 MIL/uL (ref 4.22–5.81)
RDW: 12.2 % (ref 11.5–15.5)
WBC: 6.4 K/uL (ref 4.0–10.5)
nRBC: 0 % (ref 0.0–0.2)

## 2024-08-27 LAB — LIPASE, BLOOD: Lipase: 26 U/L (ref 11–51)

## 2024-08-27 MED ORDER — ONDANSETRON 4 MG PO TBDP: 4.0000 mg | ORAL_TABLET | Freq: Three times a day (TID) | ORAL | 0 refills | Status: AC | PRN

## 2024-08-27 MED ORDER — DICYCLOMINE HCL 20 MG PO TABS: 20.0000 mg | ORAL_TABLET | Freq: Two times a day (BID) | ORAL | 0 refills | Status: AC

## 2024-08-27 MED ORDER — IOHEXOL 300 MG/ML  SOLN
100.0000 mL | Freq: Once | INTRAMUSCULAR | Status: AC | PRN
  Administered 2024-08-27: 100 mL via INTRAVENOUS

## 2024-08-27 MED ORDER — MORPHINE SULFATE (PF) 4 MG/ML IV SOLN
4.0000 mg | Freq: Once | INTRAVENOUS | Status: AC
  Administered 2024-08-27: 4 mg via INTRAVENOUS
  Filled 2024-08-27: qty 1

## 2024-08-27 NOTE — Discharge Instructions (Addendum)
 It was a pleasure taking care of you today.  As discussed, your workup was reassuring.  CT did not show any acute abnormalities.  It did show some incidental findings of cyst on your kidneys and irregularity in your ilium which could be indicative of Paget's disease. Please follow-up with PCP.  I am sending you home with medication to help with abdominal pain and nausea medication.  Take as needed.  Please follow-up with PCP for recheck.  Return to the ER for any worsening symptoms.  Your CT scans also showed your hernias. Please follow-up with your surgeon for further evaluation

## 2024-08-27 NOTE — ED Triage Notes (Signed)
 Lower abdominal pain that radiates to bilateral flank area that started yesterday at 1000. Denies nausea.  Hx of diverticulitis and kidney stones.

## 2024-08-27 NOTE — ED Provider Notes (Signed)
 [place] EMERGENCY DEPARTMENT AT [facility] Provider Note   CSN: [id number] Arrival date & time: 08/27/24  0602     Patient presents with: Flank Pain   Brandon Villanueva. is a 58 y.o. male with a past medical history significant for hypertension, CAD, hyperlipidemia, and OSA who presents to the ED due to lower abdominal pain that started yesterday morning.  Pain radiates to low back. Denies associated nausea, vomiting, and diarrhea.  No urinary symptoms.  Denies penile discharge.  No concern for STIs.  Patient notes pain radiates into testicles.  Denies fever and chills.  Previous hernia repair x2 however, no other abdominal operations.  Denies chest pain and shortness of breath.  Has a history of diverticulitis and kidney stones.   History obtained from patient and past medical records. No interpreter used during encounter.       Prior to Admission medications   Medication Sig Start Date End Date Taking? Authorizing Provider  dicyclomine (BENTYL) 20 MG tablet Take 1 tablet (20 mg total) by mouth 2 (two) times daily. 08/27/24  Yes Jermiah Howton, Aleck BROCKS, PA-C  ondansetron  (ZOFRAN -ODT) 4 MG disintegrating tablet Take 1 tablet (4 mg total) by mouth every 8 (eight) hours as needed. 08/27/24  Yes Lorelle Aleck BROCKS, PA-C  aspirin  EC 81 MG tablet Take 81 mg by mouth daily.    [provider]  carvedilol  (COREG ) 6.25 MG tablet TAKE 1 TABLET BY MOUTH TWICE A DAY WITH FOOD 09/14/23   Burnard Debby LABOR, MD  erythromycin ophthalmic ointment erythromycin 5 mg/gram (0.5 %) eye ointment    [provider]  Glucosamine-Chondroitin (COSAMIN DS PO) Take 2 tablets by mouth daily.    [provider]  losartan  (COZAAR ) 25 MG tablet Take 1 tablet (25 mg total) by mouth daily. 11/22/23   Cooper, Michael, MD  magnesium oxide (MAG-OX) 400 MG tablet Take 400 mg by mouth every evening.    [provider]  metroNIDAZOLE (METROGEL) 1 % gel Apply 1 application  topically daily as needed (rosacea flares). 06/09/21   [provider]  nitroGLYCERIN  (NITROSTAT ) 0.4 MG SL tablet Place 1 tablet (0.4 mg total) under the tongue every 5 (five) minutes as needed for chest pain (up to 3 doses. If taking 3rd dose call 911). 05/29/21   Dunn, Dayna N, PA-C  ondansetron  (ZOFRAN ) 4 MG tablet Take 1 tablet (4 mg total) by mouth every 6 (six) hours. 12/19/23   Kingsley, Victoria K, DO  oxyCODONE  (ROXICODONE ) 5 MG immediate release tablet Take 1 tablet (5 mg total) by mouth every 6 (six) hours as needed for severe pain (pain score 7-10). 12/19/23   Kingsley, Victoria K, DO  Respiratory Therapy Supplies (CARETOUCH 2 CPAP HOSE HANGER) MISC See admin instructions. 08/22/16   [provider]  rosuvastatin  (CRESTOR ) 20 MG tablet TAKE 1 TABLET BY MOUTH EVERY DAY 09/14/23   Burnard Debby LABOR, MD  tadalafil  (CIALIS ) 10 MG tablet Take 1 tablet (10 mg total) by mouth daily as needed for erectile dysfunction. 06/23/24   Wonda Sharper, MD  tamsulosin  (FLOMAX ) 0.4 MG CAPS capsule Take 1 capsule (0.4 mg total) by mouth daily. 12/19/23   Kingsley, Victoria K, DO  traMADol (ULTRAM) 50 MG tablet Take 50 mg by mouth every 6 (six) hours as needed. 03/22/22   [provider]    Allergies: Erythromycin, Lipitor [atorvastatin ], and Sumatriptan    Review of Systems  Constitutional:  Negative for chills and fever.  Respiratory:  Negative for  shortness of breath.   Cardiovascular:  Negative for chest pain.  Gastrointestinal:  Positive for abdominal pain. Negative for diarrhea, nausea and vomiting.  Genitourinary:  Negative for dysuria.    Updated Vital Signs BP (!) 143/87 (BP Location: Left Arm)   Pulse 62   Temp 97.6 F (36.4 C) (Oral)   Resp 16   Ht 5' 7.5 (1.715 m)   Wt 102.1 kg   SpO2 98%   BMI 34.72 kg/m   Physical Exam Vitals and nursing note reviewed.  Constitutional:      General: He is not in acute distress.    Appearance: He is not ill-appearing.   HENT:     Head: Normocephalic.  Eyes:     Pupils: Pupils are equal, round, and reactive to light.  Cardiovascular:     Rate and Rhythm: Normal rate and regular rhythm.     Pulses: Normal pulses.     Heart sounds: Normal heart sounds. No murmur heard.    No friction rub. No gallop.  Pulmonary:     Effort: Pulmonary effort is normal.     Breath sounds: Normal breath sounds.  Abdominal:     General: Abdomen is flat. There is no distension.     Palpations: Abdomen is soft.     Tenderness: There is abdominal tenderness. There is no guarding or rebound.     Comments: Very mild lower quadrant tenderness  Genitourinary:    Comments: GU exam performed with chaperone in room.  No testicular tenderness or edema.  Normal circumcised penis. Musculoskeletal:        General: Normal range of motion.     Cervical back: Neck supple.  Skin:    General: Skin is warm and dry.  Neurological:     General: No focal deficit present.     Mental Status: He is alert.  Psychiatric:        Mood and Affect: Mood normal.        Behavior: Behavior normal.     (all labs ordered are listed, but only abnormal results are displayed) Labs Reviewed  COMPREHENSIVE METABOLIC PANEL WITH GFR - Abnormal; Notable for the following components:      Result Value   Glucose, Bld 133 (*)    All other components within normal limits  LIPASE, BLOOD  CBC  URINALYSIS, ROUTINE W REFLEX MICROSCOPIC    EKG: None  Radiology: CT ABDOMEN PELVIS W CONTRAST Result Date: 08/27/2024 EXAM: CT ABDOMEN AND PELVIS WITH CONTRAST 08/27/2024 08:15:08 AM TECHNIQUE: CT of the abdomen and pelvis was performed with the administration of 100 mL of iohexol  (OMNIPAQUE ) 300 MG/ML solution. Multiplanar reformatted images are provided for review. Automated exposure control, iterative reconstruction, and/or weight-based adjustment of the mA/kV was utilized to reduce the radiation dose to as low as reasonably achievable. COMPARISON: CT of the  abdomen and pelvis dated 12/19/2023. CLINICAL HISTORY: RLQ abdominal pain. Lower abdominal pain that radiates to bilateral flank area that started yesterday at 1000, WBCs 6.4, history of diverticulitis and kidney stones. 100 mL Omni 300. FINDINGS: LOWER CHEST: No acute abnormality. LIVER: The liver is unremarkable. GALLBLADDER AND BILE DUCTS: Gallbladder is unremarkable. No biliary ductal dilatation. SPLEEN: No acute abnormality. PANCREAS: No acute abnormality. ADRENAL GLANDS: No acute abnormality. KIDNEYS, URETERS AND BLADDER: There are small bilateral renal cysts present, which do not require further workup or follow up. Per consensus, no follow-up is needed for simple Bosniak type 1 and 2 renal cysts, unless the patient has a malignancy history  or risk factors. No stones in the kidneys or ureters. No hydronephrosis. No perinephric or periureteral stranding. Urinary bladder is unremarkable. GI AND BOWEL: Stomach demonstrates no acute abnormality. There is no bowel obstruction. PERITONEUM AND RETROPERITONEUM: No ascites. No free air. VASCULATURE: Aorta is normal in caliber. LYMPH NODES: No lymphadenopathy. REPRODUCTIVE ORGANS: No acute abnormality. BONES AND SOFT TISSUES: There are mild-to-moderate degenerative changes within the lumbar spine. There is irregular trabeculation involving the right ilium, likely secondary to Paget's disease. The patient is status post right-sided abdominal hernia repair. There are small supraumbilical fat-containing incisional hernias present. The patient is also status post right inguinal herniorrhaphy. No acute osseous abnormality. IMPRESSION: 1. No acute findings in the abdomen or pelvis. 2. 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 3. Postsurgical changes of right inguinal herniorrhaphy and right-sided abdominal hernia repair with small supraumbilical fat-containing incisional hernias. 4. Mild-to-moderate lumbar  spine degenerative changes. 5. Irregular trabeculation of the right ilium, likely Pagets disease. Electronically signed by: Evalene Coho MD 08/27/2024 08:29 AM EDT RP Workstation: [id number]     Procedures   Medications Ordered in the ED  morphine  (PF) 4 MG/ML injection 4 mg (4 mg Intravenous Given 08/27/24 0658)  iohexol  (OMNIPAQUE ) 300 MG/ML solution 100 mL (100 mLs Intravenous Contrast Given 08/27/24 0756)    Clinical Course as of 08/27/24 0909  Wed Aug 27, 2024  0739 Glucose(!): 133 [CA]    Clinical Course User Index [CA] Lorelle Aleck BROCKS, PA-C                                 Medical Decision Making Amount and/or Complexity of Data Reviewed Labs: ordered. Decision-making details documented in ED Course. Radiology: ordered and independent interpretation performed. Decision-making details documented in ED Course. ECG/medicine tests: ordered and independent interpretation performed. Decision-making details documented in ED Course.  Risk Prescription drug management.   This patient presents to the ED for concern of abdominal pain, this involves an extensive number of treatment options, and is a complaint that carries with it a high risk of complications and morbidity.  The differential diagnosis includes diverticulitis, appendicitis, kidney stones, pyelonephritis, etc  58 year old male with history of diverticulitis and kidney stones presents to the ED due to lower abdominal pain that started yesterday morning.  Pain radiates to low back.  Denies nausea, vomiting, and diarrhea.  Previous hernia repair x 2 however, no other abdominal operations.  Upon arrival, stable vitals.  Patient well-appearing on exam.  Abdomen soft, nondistended with very mild lower quadrant tenderness.  GU exam performed with chaperone in room without testicular tenderness or edema.  Low suspicion for testicular torsion or epididymitis.  Routine labs ordered.  CT abdomen rule out evidence of  diverticulitis vs appendicitis vs kidney stone vs other etiology of pain. Morphine  given.   CBC unremarkable.  No leukocytosis.  Normal hemoglobin.  CMP with hyperglycemia 133.  No anion gap.  Low suspicion for DKA.  Normal renal function.  No major electrolyte derangements.  Lipase normal.  Negative for signs of infection.  Low suspicion for pyelonephritis.  CT abdomen personally reviewed  and interpreted which is negative for any acute abnormalities.  Does demonstrate some renal cysts and postsurgical changes.  Also demonstrates irregular trabeculation of the right ilium likely Paget's disease (patient already knows he has this).  Patient made aware of incidental findings.  EKG normal sinus rhythm.  No signs of  acute ischemia.  Low suspicion for ACS.  On reassessment, patient notes improvement in pain.  Will discharge patient with symptomatic treatment.  No evidence of diverticulitis or kidney stone on CT scan.  Unclear etiology of pain, could be from hernias. No evidence of strangulation or incarceration.  Advised patient to follow-up with his surgeon for further evaluation.  Advised patient to follow-up with PCP if symptoms do not improve over the next few days.  Patient stable for discharge. Strict ED precautions discussed with patient. Patient states understanding and agrees to plan. Patient discharged home in no acute distress and stable vitals  Co morbidities that complicate the patient evaluation  HTN Cardiac Monitoring: / EKG:  The patient was maintained on a cardiac monitor.  I personally viewed and interpreted the cardiac monitored which showed an underlying rhythm of: NSR  Social Determinants of Health:  Has PCP  Test / Admission - Considered:  Considered admission; however work-up reassuring. CT abdomen negative for any acute findings      Final diagnoses:  Lower abdominal pain    ED Discharge Orders          Ordered    dicyclomine (BENTYL) 20 MG tablet  2 times daily         08/27/24 0904    ondansetron  (ZOFRAN -ODT) 4 MG disintegrating tablet  Every 8 hours PRN        08/27/24 0904               Lorelle Aleck BROCKS, PA-C 08/27/24 0910    Rogelia Jerilynn RAMAN, MD 08/28/24 [phone]

## 2024-09-11 DIAGNOSIS — R1013 Epigastric pain: Secondary | ICD-10-CM | POA: Diagnosis not present

## 2024-09-11 DIAGNOSIS — W57XXXD Bitten or stung by nonvenomous insect and other nonvenomous arthropods, subsequent encounter: Secondary | ICD-10-CM | POA: Diagnosis not present

## 2024-09-11 DIAGNOSIS — S60465D Insect bite (nonvenomous) of left ring finger, subsequent encounter: Secondary | ICD-10-CM | POA: Diagnosis not present

## 2024-09-17 DIAGNOSIS — G4733 Obstructive sleep apnea (adult) (pediatric): Secondary | ICD-10-CM | POA: Diagnosis not present

## 2024-09-22 DIAGNOSIS — M889 Osteitis deformans of unspecified bone: Secondary | ICD-10-CM | POA: Diagnosis not present

## 2024-10-01 ENCOUNTER — Other Ambulatory Visit: Payer: Self-pay | Admitting: Cardiovascular Disease

## 2024-10-06 MED ORDER — CARVEDILOL 6.25 MG PO TABS: 6.2500 mg | ORAL_TABLET | Freq: Two times a day (BID) | ORAL | 0 refills | Status: AC

## 2024-10-06 MED ORDER — ROSUVASTATIN CALCIUM 20 MG PO TABS: 20.0000 mg | ORAL_TABLET | Freq: Every day | ORAL | 0 refills | Status: AC

## 2024-10-15 ENCOUNTER — Other Ambulatory Visit: Payer: Self-pay | Admitting: Cardiovascular Disease

## 2024-10-17 ENCOUNTER — Other Ambulatory Visit: Payer: Self-pay

## 2024-10-17 DIAGNOSIS — G4733 Obstructive sleep apnea (adult) (pediatric): Secondary | ICD-10-CM | POA: Diagnosis not present

## 2024-10-17 MED ORDER — LOSARTAN POTASSIUM 25 MG PO TABS: 25.0000 mg | ORAL_TABLET | Freq: Every day | ORAL | 0 refills | Status: AC

## 2024-11-06 ENCOUNTER — Encounter: Payer: Self-pay | Admitting: Cardiovascular Disease

## 2025-02-17 ENCOUNTER — Ambulatory Visit: Admitting: Cardiovascular Disease
